# Patient Record
Sex: Female | Born: 1945
Health system: Southern US, Community
[De-identification: ages and names within clinical notes are randomized; demographics above are authoritative.]

## PROBLEM LIST (undated history)

## (undated) DIAGNOSIS — M1990 Unspecified osteoarthritis, unspecified site: Secondary | ICD-10-CM

## (undated) DIAGNOSIS — E039 Hypothyroidism, unspecified: Secondary | ICD-10-CM

## (undated) DIAGNOSIS — E669 Obesity, unspecified: Secondary | ICD-10-CM

## (undated) DIAGNOSIS — E079 Disorder of thyroid, unspecified: Secondary | ICD-10-CM

## (undated) DIAGNOSIS — T7840XA Allergy, unspecified, initial encounter: Secondary | ICD-10-CM

## (undated) DIAGNOSIS — C801 Malignant (primary) neoplasm, unspecified: Secondary | ICD-10-CM

## (undated) DIAGNOSIS — I10 Essential (primary) hypertension: Secondary | ICD-10-CM

## (undated) DIAGNOSIS — K5790 Diverticulosis of intestine, part unspecified, without perforation or abscess without bleeding: Secondary | ICD-10-CM

## (undated) DIAGNOSIS — L570 Actinic keratosis: Secondary | ICD-10-CM

## (undated) DIAGNOSIS — G4730 Sleep apnea, unspecified: Secondary | ICD-10-CM

## (undated) DIAGNOSIS — R7302 Impaired glucose tolerance (oral): Secondary | ICD-10-CM

## (undated) HISTORY — DX: Disorder of thyroid, unspecified: E07.9

## (undated) HISTORY — DX: Sleep apnea, unspecified: G47.30

## (undated) HISTORY — PX: MOHS SURGERY: SUR867

## (undated) HISTORY — DX: Obesity, unspecified: E66.9

## (undated) HISTORY — DX: Actinic keratosis: L57.0

## (undated) HISTORY — DX: Impaired glucose tolerance (oral): R73.02

## (undated) HISTORY — PX: CATARACT EXTRACTION: SUR2

## (undated) HISTORY — DX: Essential (primary) hypertension: I10

## (undated) HISTORY — DX: Diverticulosis of intestine, part unspecified, without perforation or abscess without bleeding: K57.90

## (undated) HISTORY — PX: BROW LIFT: SHX178

## (undated) HISTORY — DX: Allergy, unspecified, initial encounter: T78.40XA

---

## 2005-12-21 ENCOUNTER — Ambulatory Visit: Payer: Self-pay | Admitting: Family Medicine

## 2005-12-28 ENCOUNTER — Ambulatory Visit: Payer: Self-pay | Admitting: Family Medicine

## 2006-04-06 ENCOUNTER — Ambulatory Visit: Payer: Self-pay | Admitting: Family Medicine

## 2006-06-20 ENCOUNTER — Ambulatory Visit: Payer: Self-pay | Admitting: Family Medicine

## 2007-02-20 ENCOUNTER — Ambulatory Visit: Payer: Self-pay | Admitting: Family Medicine

## 2007-02-20 ENCOUNTER — Other Ambulatory Visit: Admission: RE | Admit: 2007-02-20 | Discharge: 2007-02-20 | Payer: Self-pay | Admitting: Family Medicine

## 2008-01-08 ENCOUNTER — Ambulatory Visit: Payer: Self-pay | Admitting: Family Medicine

## 2008-01-09 ENCOUNTER — Encounter: Admission: RE | Admit: 2008-01-09 | Discharge: 2008-01-09 | Payer: Self-pay | Admitting: Family Medicine

## 2008-02-26 ENCOUNTER — Ambulatory Visit: Payer: Self-pay | Admitting: Family Medicine

## 2008-03-11 ENCOUNTER — Ambulatory Visit: Payer: Self-pay | Admitting: Internal Medicine

## 2008-03-25 ENCOUNTER — Ambulatory Visit: Payer: Self-pay | Admitting: Internal Medicine

## 2009-03-20 ENCOUNTER — Ambulatory Visit: Payer: Self-pay | Admitting: Family Medicine

## 2009-03-20 ENCOUNTER — Other Ambulatory Visit: Admission: RE | Admit: 2009-03-20 | Discharge: 2009-03-20 | Payer: Self-pay | Admitting: Family Medicine

## 2009-09-23 ENCOUNTER — Ambulatory Visit: Payer: Self-pay | Admitting: Family Medicine

## 2010-02-28 HISTORY — PX: COLONOSCOPY: SHX174

## 2010-02-28 HISTORY — PX: TAYLOR BUNIONECTOMY: SHX2485

## 2010-02-28 LAB — HM COLONOSCOPY

## 2010-03-30 NOTE — Procedures (Signed)
Summary: Colonoscopy   Colonoscopy  Procedure date:  03/25/2008  Findings:      Location:  [facility].    Procedures Next Due Date:    Colonoscopy: 03/2018  COLONOSCOPY PROCEDURE REPORT  PATIENT:  Carla Wright, Carla Wright  MR#:  [id number] BIRTHDATE:   [birth year]   GENDER:   female  ENDOSCOPIST:   Hedwig Morton. Juanda Chance, MD Referred by: [name], M.D.  PROCEDURE DATE:  03/25/2008 PROCEDURE:  Colonoscopy, diagnostic ASA CLASS:   Class I INDICATIONS: screening   MEDICATIONS:    Versed 7 mg, Fentanyl 50 mcg  DESCRIPTION OF PROCEDURE:   After the risks benefits and alternatives of the procedure were thoroughly explained, informed consent was obtained.  Digital rectal exam was performed and revealed no rectal masses.   The LB PCF-H180AL [id number] endoscope was introduced through the anus and advanced to the cecum, which was identified by both the appendix and ileocecal valve, without limitations.  The quality of the prep was poor, using MiraLax.  The instrument was then slowly withdrawn as the colon was fully examined. <<PROCEDUREIMAGES>>            <<OLD IMAGES>>  FINDINGS:  normal cecum (see image4, image5, and image6). prominent but normal ileocecal valve  normal rectum (see image7).  Severe diverticulosis was found throughout the colon (see image1, image2, and image3).   Retroflexed views in the rectum revealed no abnormalities.    The scope was then withdrawn from the patient and the procedure completed.  COMPLICATIONS:   None  ENDOSCOPIC IMPRESSION:  1) Normal cecum  2) Normal rectum  3) Severe diverticulosis throughout the colon  no polyps RECOMMENDATIONS:  1) high fiber diet  2) metamucil or benefiber  REPEAT EXAM:   In 10 year(s) for.   _______________________________ Hedwig Morton. Juanda Chance, MD  CC: [name], MD

## 2010-03-30 NOTE — Miscellaneous (Signed)
Summary: GI PV  Clinical Lists Changes  Medications: Added new medication of DULCOLAX 5 MG  TBEC (BISACODYL) Day before procedure take 2 at 3pm and 2 at 8pm. - Signed Added new medication of METOCLOPRAMIDE HCL 10 MG  TABS (METOCLOPRAMIDE HCL) As per prep instructions. - Signed Added new medication of MIRALAX   POWD (POLYETHYLENE GLYCOL 3350) As per prep  instructions. - Signed Rx of DULCOLAX 5 MG  TBEC (BISACODYL) Day before procedure take 2 at 3pm and 2 at 8pm.;  #4 x 0;  Signed;  Entered by: Barton Fanny RN;  Authorized by: Hart Carwin MD;  Method used: Print then Give to Patient Rx of METOCLOPRAMIDE HCL 10 MG  TABS (METOCLOPRAMIDE HCL) As per prep instructions.;  #2 x 0;  Signed;  Entered by: Barton Fanny RN;  Authorized by: Hart Carwin MD;  Method used: Print then Give to Patient Rx of MIRALAX   POWD (POLYETHYLENE GLYCOL 3350) As per prep  instructions.;  #255gm x 0;  Signed;  Entered by: Barton Fanny RN;  Authorized by: Hart Carwin MD;  Method used: Print then Give to Patient Observations: Added new observation of NKA: T (03/11/2008 10:30)    Prescriptions: MIRALAX   POWD (POLYETHYLENE GLYCOL 3350) As per prep  instructions.  #255gm x 0   Entered by:   Barton Fanny RN   Authorized by:   Hart Carwin MD   Signed by:   Barton Fanny RN on 03/11/2008   Method used:   Print then Give to Patient   RxID:   [id number] METOCLOPRAMIDE HCL 10 MG  TABS (METOCLOPRAMIDE HCL) As per prep instructions.  #2 x 0   Entered by:   Barton Fanny RN   Authorized by:   Hart Carwin MD   Signed by:   Barton Fanny RN on 03/11/2008   Method used:   Print then Give to Patient   RxID:   [id number] DULCOLAX 5 MG  TBEC (BISACODYL) Day before procedure take 2 at 3pm and 2 at 8pm.  #4 x 0   Entered by:   Barton Fanny RN   Authorized by:   Hart Carwin MD   Signed by:   Barton Fanny RN on 03/11/2008   Method used:   Print then Give to Patient   RxID:   [id number]

## 2010-05-13 ENCOUNTER — Ambulatory Visit
Admission: RE | Admit: 2010-05-13 | Discharge: 2010-05-13 | Disposition: A | Discharge status: Home / Self Care | Payer: 59 | Source: Ambulatory Visit | Attending: Family Medicine | Admitting: Family Medicine

## 2010-05-13 ENCOUNTER — Other Ambulatory Visit: Payer: Self-pay | Admitting: Family Medicine

## 2010-05-13 ENCOUNTER — Encounter (INDEPENDENT_AMBULATORY_CARE_PROVIDER_SITE_OTHER): Payer: 59 | Admitting: Family Medicine

## 2010-05-13 DIAGNOSIS — E039 Hypothyroidism, unspecified: Secondary | ICD-10-CM

## 2010-05-13 DIAGNOSIS — G4730 Sleep apnea, unspecified: Secondary | ICD-10-CM

## 2010-05-13 DIAGNOSIS — Z0000 Encounter for general adult medical examination without abnormal findings: Secondary | ICD-10-CM

## 2010-05-13 DIAGNOSIS — R52 Pain, unspecified: Secondary | ICD-10-CM

## 2010-05-13 DIAGNOSIS — M109 Gout, unspecified: Secondary | ICD-10-CM

## 2010-05-13 DIAGNOSIS — Z79899 Other long term (current) drug therapy: Secondary | ICD-10-CM

## 2010-07-22 ENCOUNTER — Telehealth: Payer: Self-pay | Admitting: Family Medicine

## 2010-07-31 ENCOUNTER — Encounter: Payer: Self-pay | Admitting: Family Medicine

## 2010-07-31 DIAGNOSIS — Z8739 Personal history of other diseases of the musculoskeletal system and connective tissue: Secondary | ICD-10-CM | POA: Insufficient documentation

## 2010-07-31 DIAGNOSIS — R7302 Impaired glucose tolerance (oral): Secondary | ICD-10-CM | POA: Insufficient documentation

## 2010-07-31 DIAGNOSIS — G4730 Sleep apnea, unspecified: Secondary | ICD-10-CM | POA: Insufficient documentation

## 2010-07-31 DIAGNOSIS — K5790 Diverticulosis of intestine, part unspecified, without perforation or abscess without bleeding: Secondary | ICD-10-CM | POA: Insufficient documentation

## 2010-07-31 DIAGNOSIS — L570 Actinic keratosis: Secondary | ICD-10-CM | POA: Insufficient documentation

## 2010-08-02 ENCOUNTER — Ambulatory Visit (INDEPENDENT_AMBULATORY_CARE_PROVIDER_SITE_OTHER): Payer: 59 | Admitting: Family Medicine

## 2010-08-02 ENCOUNTER — Encounter: Payer: Self-pay | Admitting: Family Medicine

## 2010-08-02 VITALS — BP 116/70 | HR 85

## 2010-08-02 DIAGNOSIS — G4733 Obstructive sleep apnea (adult) (pediatric): Secondary | ICD-10-CM | POA: Insufficient documentation

## 2010-08-02 NOTE — Progress Notes (Signed)
  Subjective:    Patient ID: Carla Wright, female    DOB: 1945-11-26, 65 y.o.   MRN: [id number]  HPI She is here for consult. Her apnea length did show borderline sleep apnea with definite hypoxia.   Review of Systems     Objective:   Physical Exam Alert and in no distress otherwise not examined       Assessment & Plan:  Sleep apnea. Obesity. I discussed the diagnosis and the ramifications with treatment versus no treatment. Gustatory use of sleep and help if needed. Also strongly encouraged her to make lifestyle changse to help with weight loss. She will be set up for CPAP autotitrate. Over 20 minutes spent discussing this with her.

## 2010-08-02 NOTE — Patient Instructions (Signed)
Use the machine regularly at night and if you have trouble with the, please call me. Recheck here in one month.

## 2010-08-05 NOTE — Telephone Encounter (Signed)
Pt needs appt

## 2010-09-07 ENCOUNTER — Ambulatory Visit: Payer: 59 | Admitting: Family Medicine

## 2010-09-14 ENCOUNTER — Ambulatory Visit: Payer: 59 | Admitting: Family Medicine

## 2011-06-13 ENCOUNTER — Ambulatory Visit (INDEPENDENT_AMBULATORY_CARE_PROVIDER_SITE_OTHER): Payer: 59 | Admitting: Family Medicine

## 2011-06-13 ENCOUNTER — Encounter: Payer: Self-pay | Admitting: Family Medicine

## 2011-06-13 ENCOUNTER — Other Ambulatory Visit: Payer: Self-pay | Admitting: Family Medicine

## 2011-06-13 VITALS — BP 130/84 | HR 80 | Ht 64.5 in | Wt 250.0 lb

## 2011-06-13 DIAGNOSIS — I10 Essential (primary) hypertension: Secondary | ICD-10-CM

## 2011-06-13 DIAGNOSIS — Z0000 Encounter for general adult medical examination without abnormal findings: Secondary | ICD-10-CM

## 2011-06-13 DIAGNOSIS — E1159 Type 2 diabetes mellitus with other circulatory complications: Secondary | ICD-10-CM | POA: Insufficient documentation

## 2011-06-13 DIAGNOSIS — E039 Hypothyroidism, unspecified: Secondary | ICD-10-CM | POA: Insufficient documentation

## 2011-06-13 DIAGNOSIS — E669 Obesity, unspecified: Secondary | ICD-10-CM

## 2011-06-13 DIAGNOSIS — M109 Gout, unspecified: Secondary | ICD-10-CM

## 2011-06-13 DIAGNOSIS — G4730 Sleep apnea, unspecified: Secondary | ICD-10-CM | POA: Diagnosis not present

## 2011-06-13 DIAGNOSIS — Z79899 Other long term (current) drug therapy: Secondary | ICD-10-CM

## 2011-06-13 NOTE — Progress Notes (Signed)
Subjective:    Patient ID: Carla Wright, female    DOB: [birth year], 66 y.o.   MRN: [id number]  HPI She is here for a complete examination. She is scheduled for hip replacement. Presently she is using a walker because of the pain. She mainly complains of right hip pain and is also having some difficulty with her left hip. No other joints seem to be involved. She's had no swelling redness or tenderness from her gout. She has no other concerns or complaints. She has lost a little bit of weight during this time frame. She continues on medications listed in the chart and has no particular problems or concerns about these medications. Her immunizations were reviewed. She has had a mammogram. Pap was in 2011. Some underlying sleep apnea and we've agreed on that. She will send off for that. She presently is involved in [employer]. She is planning to retire next year.   Review of Systems  Constitutional: Negative.   HENT: Negative.   Respiratory: Negative.   Cardiovascular: Negative.   Musculoskeletal: Positive for arthralgias and gait problem.  Neurological: Negative.   Hematological: Negative.   Psychiatric/Behavioral: Negative.        Objective:   Physical Exam BP 130/84  Pulse 80  Ht 5' 4.5" (1.638 m)  Wt 250 lb (113.399 kg)  BMI 42.25 kg/m2  SpO2 98%  General Appearance:    Alert, cooperative, no distress, appears stated age  Head:    Normocephalic, without obvious abnormality, atraumatic  Eyes:    PERRL, conjunctiva/corneas clear, EOM's intact, fundi    benign  Ears:    Normal TM's and external ear canals  Nose:   Nares normal, mucosa normal, no drainage or sinus   tenderness  Throat:   Lips, mucosa, and tongue normal; teeth and gums normal  Neck:   Supple, no lymphadenopathy;  thyroid:  no   enlargement/tenderness/nodules; no carotid   bruit or JVD  Back:    Spine nontender, no curvature, ROM normal, no CVA     tenderness  Lungs:     Clear to auscultation bilaterally without  wheezes, rales or     ronchi; respirations unlabored  Chest Wall:    No tenderness or deformity   Heart:    Regular rate and rhythm, S1 and S2 normal, no murmur, rub   or gallop  Breast Exam:    Deferred to GYN  Abdomen:     Soft, non-tender, nondistended, normoactive bowel sounds,    no masses, no hepatosplenomegaly  Genitalia:    Deferred to GYN     Extremities:   No clubbing, cyanosis or edema  Pulses:   2+ and symmetric all extremities  Skin:   Skin color, texture, turgor normal, no rashes or lesions  Lymph nodes:   Cervical, supraclavicular, and axillary nodes normal  Neurologic:   CNII-XII intact, normal strength, sensation and gait; reflexes 2+ and symmetric throughout          Psych:   Normal mood, affect, hygiene and grooming.    EKG shows minimal change from one that was done 10 years ago. Incomplete bundle block is noted        Assessment & Plan:   1. Routine general medical examination at a health care facility  DG Bone Density, PR ELECTROCARDIOGRAM, COMPLETE, CBC with Differential, Comprehensive metabolic panel  2. Obesity  CBC with Differential, Comprehensive metabolic panel, Lipid panel  3. Gout  Uric Acid  4. Sleep apnea    5.  Hypothyroid  TSH  6. Hypertension  PR ELECTROCARDIOGRAM, COMPLETE  7. Encounter for long-term (current) use of other medications  CBC with Differential, Comprehensive metabolic panel, Uric Acid, TSH, Lipid panel   we will get a read out on her CPAP. She is cleared for surgery.

## 2011-06-14 LAB — COMPREHENSIVE METABOLIC PANEL
ALT: 9 U/L (ref 0–35)
AST: 12 U/L (ref 0–37)
Albumin: 4.1 g/dL (ref 3.5–5.2)
Alkaline Phosphatase: 82 U/L (ref 39–117)
BUN: 27 mg/dL — ABNORMAL HIGH (ref 6–23)
CO2: 27 mEq/L (ref 19–32)
Calcium: 10.4 mg/dL (ref 8.4–10.5)
Chloride: 100 mEq/L (ref 96–112)
Creat: 0.89 mg/dL (ref 0.50–1.10)
Glucose, Bld: 90 mg/dL (ref 70–99)
Potassium: 4 mEq/L (ref 3.5–5.3)
Sodium: 137 mEq/L (ref 135–145)
Total Bilirubin: 0.5 mg/dL (ref 0.3–1.2)
Total Protein: 6.7 g/dL (ref 6.0–8.3)

## 2011-06-14 LAB — URIC ACID: Uric Acid, Serum: 4.4 mg/dL (ref 2.4–7.0)

## 2011-06-14 LAB — LIPID PANEL
Cholesterol: 155 mg/dL (ref 0–200)
HDL: 58 mg/dL (ref >39)
LDL Cholesterol: 85 mg/dL (ref 0–99)
Total CHOL/HDL Ratio: 2.7 Ratio
Triglycerides: 59 mg/dL (ref <150)
VLDL: 12 mg/dL (ref 0–40)

## 2011-06-14 LAB — CBC WITH DIFFERENTIAL/PLATELET
Basophils Absolute: 0 10*3/uL (ref 0.0–0.1)
Basophils Relative: 0 % (ref 0–1)
Eosinophils Absolute: 0.2 10*3/uL (ref 0.0–0.7)
Eosinophils Relative: 2 % (ref 0–5)
HCT: 39.2 % (ref 36.0–46.0)
Hemoglobin: 12.5 g/dL (ref 12.0–15.0)
Lymphocytes Relative: 21 % (ref 12–46)
Lymphs Abs: 2.3 10*3/uL (ref 0.7–4.0)
MCH: 31.3 pg (ref 26.0–34.0)
MCHC: 31.9 g/dL (ref 30.0–36.0)
MCV: 98 fL (ref 78.0–100.0)
Monocytes Absolute: 0.3 10*3/uL (ref 0.1–1.0)
Monocytes Relative: 2 % — ABNORMAL LOW (ref 3–12)
Neutro Abs: 8.1 10*3/uL — ABNORMAL HIGH (ref 1.7–7.7)
Neutrophils Relative %: 75 % (ref 43–77)
Platelets: 268 10*3/uL (ref 150–400)
RBC: 4 MIL/uL (ref 3.87–5.11)
RDW: 14.5 % (ref 11.5–15.5)
WBC: 10.9 10*3/uL — ABNORMAL HIGH (ref 4.0–10.5)

## 2011-06-14 LAB — TSH: TSH: 1.887 u[IU]/mL (ref 0.350–4.500)

## 2011-06-19 ENCOUNTER — Other Ambulatory Visit: Payer: Self-pay | Admitting: Orthopedic Surgery

## 2011-06-19 MED ORDER — BUPIVACAINE LIPOSOME 1.3 % IJ SUSP: 20.0000 mL | Freq: Once | INTRAMUSCULAR | Status: DC

## 2011-06-19 MED ORDER — DEXAMETHASONE SODIUM PHOSPHATE 10 MG/ML IJ SOLN: 10.0000 mg | Freq: Once | INTRAMUSCULAR | Status: DC

## 2011-06-21 LAB — HM DEXA SCAN: HM Dexa Scan: NORMAL

## 2011-06-25 ENCOUNTER — Other Ambulatory Visit: Payer: Self-pay | Admitting: Family Medicine

## 2011-06-27 ENCOUNTER — Encounter: Payer: Self-pay | Admitting: Internal Medicine

## 2011-08-05 NOTE — H&P (Signed)
Carla Wright DOB: 20-Feb-1946  Chief Complaint: right hip pain  History of Present Illness The patient is a 66 year old female who comes in today for a preoperative History and Physical. The patient is scheduled for a right total hip arthroplasty to be performed by Dr. [name]. Aluisio, MD at [facility] on Monday August 22, 2011 . Owen has had trouble with pain in both hips. She has an intra-articular injection in both hips. The left hip did very well with the intraarticular injection. The right hip only got better with a couple of days. Pain is occurring at all times. It is limiting what she can and cannot do. It has progressed significantly in the past year. Due to failure of conservative measures in the right hip, the most predictable means for decreased pain and increased function in the right hip is a right total hip arthroplasty. Risks and benefits of the surgery discussed.  PCP: Dr. [name]   Problem List/Past Medica History Osteoarthrosis, hip Gout Skin Cancer. basal cell carcinoma on face Sleep Apnea. does not use CPAP Hypothyroidism Osteoarthritis Hypertension Diverticulosis Urinary Tract Infection Hypothyroidism Measles. childhood Rubella. childhood Menopause  Allergies No Known Drug Allergies.   Family History Heart disease in female family member before age 15 Hypertension. father Osteoarthritis. father and sister Congestive Heart Failure. mother Heart Disease. mother Father. deceased age 56 due to heart disease Mother. deceased age 84 due to CHF   Social History Tobacco use. former smoker; smoke(d) 1 pack(s) per day; quit 30 years ago Tobacco / smoke exposure. no Previously in rehab. no Pain Contract. no Number of flights of stairs before winded. 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 single Living situation. live alone Illicit drug use. no Exercise. Exercises  never Drug/Alcohol Rehab (Currently). no Current work status. working full time Children. 0 Alcohol use. current drinker; drinks wine and hard liquor; only occasionally per week Post-Surgical Plans. going to sister's house Advance Directives. none   Medication History Mobic (15MG  Tablet, 1 Oral TABLET DAILY WITH FOOD., Taken starting 05/20/2011) Active. Ultracet (37.5-325MG  Tablet, 1-2 Oral q 6-8hrs prn pain, Taken starting 05/20/2011) Active. Allopurinol (300MG  Tablet, Oral daily) Active. Lisinopril-Hydrochlorothiazide (20-12.5MG  Tablet, Oral daily) Active. Synthroid ( Tablet, Oral daily) Active. Advil (200MG  Capsule, Oral as needed) Active. Tylenol Extra Strength (500MG  Tablet, Oral as needed) Active. Womens 50+ Multi Vitamin/Min ( Oral daily) Active. B Complex ( Oral daily) Active. Aspirin EC (81MG  Tablet DR, Oral daily) Active.   Past Surgical History Foot Surgery. right 2012   Diagnostic Studies History EKG. history of bundle branch block 06/13/2011    Review of Systems General:Not Present- Chills, Fever, Night Sweats, Fatigue, Weight Gain, Weight Loss and Memory Loss. Skin:Not Present- Hives, Itching, Rash, Eczema and Lesions. HEENT:Not Present- Tinnitus, Headache, Double Vision, Visual Loss, Hearing Loss and Dentures. Respiratory:Present- Allergies. Not Present- Shortness of breath with exertion, Shortness of breath at rest, Coughing up blood and Chronic Cough. Cardiovascular:Not Present- Chest Pain, Racing/skipping heartbeats, Difficulty Breathing Lying Down, Murmur, Swelling and Palpitations. Gastrointestinal:Not Present- Bloody Stool, Heartburn, Abdominal Pain, Vomiting, Nausea, Constipation, Diarrhea, Difficulty Swallowing, Jaundice and Loss of appetitie. Female Genitourinary:Not Present- Blood in Urine, Urinary frequency, Weak urinary stream, Discharge, Flank Pain, Incontinence, Painful Urination, Urgency, Urinary Retention and Urinating at  Night. Musculoskeletal:Present- Joint Pain. Not Present- Muscle Weakness, Muscle Pain, Joint Swelling, Back Pain, Morning Stiffness and Spasms. Neurological:Not Present- Tremor, Dizziness, Blackout spells, Paralysis, Difficulty with balance and Weakness. Psychiatric:Not Present- Insomnia.   Vitals  Weight: 249 lb Height: 65 in Body Surface Area: 2.28 m Body Mass Index: 41.44 kg/m Pulse: 76 (Regular) Resp.: 16 (Unlabored) BP: 122/74 (Sitting, Left Arm, Standard)    Physical Exam General Mental Status - Alert, cooperative and good historian. General Appearance- pleasant. Not in acute distress. Orientation- Oriented X3. Build & Nutrition- Obese and Well developed. Head and Neck Head- normocephalic, atraumatic . Neck Global Assessment- supple. no bruit auscultated on the right and no bruit auscultated on the left. Eye Pupil- Bilateral- Normal. Motion- Bilateral- EOMI. Chest and Lung Exam Auscultation: Breath sounds:- clear at anterior chest wall and - clear at posterior chest wall. Adventitious sounds:- No Adventitious sounds. Cardiovascular Auscultation:Rhythm- Regular rate and rhythm. Heart Sounds- S1 WNL and S2 WNL. Murmurs & Other Heart Sounds:Auscultation of the heart reveals - No Murmurs. Abdomen Palpation/Percussion:Tenderness- Abdomen is non-tender to palpation. Rigidity (guarding)- Abdomen is soft. Auscultation:Auscultation of the abdomen reveals - Bowel sounds normal. Female Genitourinary Not done, not pertinent to present illness Peripheral Vascular Upper Extremity: Palpation:- Pulses bilaterally normal. Lower Extremity: Palpation:- Pulses bilaterally normal. Neurologic Examination of related systems reveals - normal muscle strength and tone in all extremities. Neurologic evaluation reveals - normal sensation and upper and lower extremity deep tendon reflexes intact bilaterally . Musculoskeletal Her right hip  only can be flexed to 90 with no rotation, about 20 degrees of abduction. The left hip flexion 100, rotation in 10, out 20, abduction 20 with pain. Tenderness over the right greater trochanteric bursa. No tenderness over the left.    RADIOGRAPHS: AP pelvis, lateral of both hips, and she has horrendous arthritic change in both hips. The right is bone on bone with some erosion of the femoral head and superior acetabulum. The left is bone on bone with no erosions.   Assessment & Plan Osteoarthrosis, hip Right total hip arthroplasty     Dimitri Ped, PA-C

## 2011-08-10 ENCOUNTER — Encounter (HOSPITAL_COMMUNITY): Payer: Self-pay | Admitting: Pharmacy Technician

## 2011-08-15 ENCOUNTER — Ambulatory Visit (HOSPITAL_COMMUNITY)
Admission: RE | Admit: 2011-08-15 | Discharge: 2011-08-15 | Disposition: A | Discharge status: Home / Self Care | Payer: 59 | Source: Ambulatory Visit | Attending: Orthopedic Surgery | Admitting: Orthopedic Surgery

## 2011-08-15 ENCOUNTER — Encounter (HOSPITAL_COMMUNITY): Payer: Self-pay

## 2011-08-15 ENCOUNTER — Encounter (HOSPITAL_COMMUNITY)
Admission: RE | Admit: 2011-08-15 | Discharge: 2011-08-15 | Disposition: A | Discharge status: Home / Self Care | Payer: 59 | Source: Ambulatory Visit | Attending: Orthopedic Surgery | Admitting: Orthopedic Surgery

## 2011-08-15 DIAGNOSIS — M1610 Unilateral primary osteoarthritis, unspecified hip: Secondary | ICD-10-CM | POA: Insufficient documentation

## 2011-08-15 DIAGNOSIS — S73006A Unspecified dislocation of unspecified hip, initial encounter: Secondary | ICD-10-CM | POA: Diagnosis not present

## 2011-08-15 DIAGNOSIS — Z01818 Encounter for other preprocedural examination: Secondary | ICD-10-CM | POA: Insufficient documentation

## 2011-08-15 DIAGNOSIS — M169 Osteoarthritis of hip, unspecified: Secondary | ICD-10-CM | POA: Insufficient documentation

## 2011-08-15 DIAGNOSIS — Z01811 Encounter for preprocedural respiratory examination: Secondary | ICD-10-CM | POA: Diagnosis not present

## 2011-08-15 DIAGNOSIS — M25559 Pain in unspecified hip: Secondary | ICD-10-CM | POA: Diagnosis not present

## 2011-08-15 DIAGNOSIS — Z01812 Encounter for preprocedural laboratory examination: Secondary | ICD-10-CM | POA: Insufficient documentation

## 2011-08-15 HISTORY — DX: Malignant (primary) neoplasm, unspecified: C80.1

## 2011-08-15 HISTORY — DX: Unspecified osteoarthritis, unspecified site: M19.90

## 2011-08-15 HISTORY — DX: Hypothyroidism, unspecified: E03.9

## 2011-08-15 LAB — URINALYSIS, ROUTINE W REFLEX MICROSCOPIC
Bilirubin Urine: NEGATIVE
Glucose, UA: NEGATIVE mg/dL
Hgb urine dipstick: NEGATIVE
Ketones, ur: NEGATIVE mg/dL
Nitrite: NEGATIVE
Protein, ur: NEGATIVE mg/dL
Specific Gravity, Urine: 1.019 (ref 1.005–1.030)
Urobilinogen, UA: 0.2 mg/dL (ref 0.0–1.0)
pH: 6 (ref 5.0–8.0)

## 2011-08-15 LAB — COMPREHENSIVE METABOLIC PANEL
ALT: 10 U/L (ref 0–35)
AST: 14 U/L (ref 0–37)
Albumin: 4.2 g/dL (ref 3.5–5.2)
Alkaline Phosphatase: 89 U/L (ref 39–117)
BUN: 26 mg/dL — ABNORMAL HIGH (ref 6–23)
CO2: 25 mEq/L (ref 19–32)
Calcium: 10.8 mg/dL — ABNORMAL HIGH (ref 8.4–10.5)
Chloride: 98 mEq/L (ref 96–112)
Creatinine, Ser: 0.91 mg/dL (ref 0.50–1.10)
GFR calc Af Amer: 75 mL/min — ABNORMAL LOW (ref >90)
GFR calc non Af Amer: 65 mL/min — ABNORMAL LOW (ref >90)
Glucose, Bld: 84 mg/dL (ref 70–99)
Potassium: 3.6 mEq/L (ref 3.5–5.1)
Sodium: 137 mEq/L (ref 135–145)
Total Bilirubin: 0.3 mg/dL (ref 0.3–1.2)
Total Protein: 7.7 g/dL (ref 6.0–8.3)

## 2011-08-15 LAB — PROTIME-INR
INR: 1.02 (ref 0.00–1.49)
Prothrombin Time: 13.6 seconds (ref 11.6–15.2)

## 2011-08-15 LAB — URINE MICROSCOPIC-ADD ON

## 2011-08-15 LAB — SURGICAL PCR SCREEN
MRSA, PCR: POSITIVE — AB
Staphylococcus aureus: POSITIVE — AB

## 2011-08-15 LAB — APTT: aPTT: 32 seconds (ref 24–37)

## 2011-08-15 LAB — CBC
HCT: 40.1 % (ref 36.0–46.0)
Hemoglobin: 13.4 g/dL (ref 12.0–15.0)
MCH: 31.5 pg (ref 26.0–34.0)
MCHC: 33.4 g/dL (ref 30.0–36.0)
MCV: 94.4 fL (ref 78.0–100.0)
Platelets: 266 10*3/uL (ref 150–400)
RBC: 4.25 MIL/uL (ref 3.87–5.11)
RDW: 13.5 % (ref 11.5–15.5)
WBC: 8.7 10*3/uL (ref 4.0–10.5)

## 2011-08-15 LAB — ABO/RH: ABO/RH(D): O POS

## 2011-08-15 NOTE — Pre-Procedure Instructions (Signed)
EKG reviewed by Dr Rose- "OK" 

## 2011-08-15 NOTE — Patient Instructions (Signed)
20 Carla Wright  08/15/2011   Your procedure is scheduled on:  08/22/11  Surgery Monday  1530-1650  Report to [facility] at    1300     1 PM Call this number if you have problems the morning of surgery: [phone]     Or PST   [id number]  [facility]   Remember:   Do not eat food:After Midnight. Sunday NIGHT  May have clear liquids: until 0700 Monday MORNING THEN NONE  Clear liquids include soda, tea, black coffee, apple or grape juice, broth.  Take these medicines the morning of surgery with A SIP OF WATER:LEVOTHYROXINE               TRAMADOL IF NEEDED   Do not wear jewelry, make-up or nail polish.  Do not wear lotions, powders, or perfumes. You may wear deodorant.  Do not shave 48 hours prior to surgery.  Do not bring valuables to the hospital.  Contacts, dentures or bridgework may not be worn into surgery.  Leave suitcase in the car. After surgery it may be brought to your room.  For patients admitted to the hospital, checkout time is 11:00 AM the day of discharge.   Patients discharged the day of surgery will not be allowed to drive home.  Name and phone number of your driver:    sister                                                                  Special Instructions: CHG Shower Use Special Wash: 1/2 bottle night before surgery and 1/2 bottle morning of surgery. REGULAR SOAP FACE AND PRIVATES              LADIES- NO SHAVING 48 HOURS BEFORE USING BETASEPT SOAP.                  Please read over the following fact sheets that you were given: MRSA Information

## 2011-08-15 NOTE — Pre-Procedure Instructions (Signed)
Spoke with Candy at [facility] who is sending intra office email to provider to review abnormal BUN, Calcium, Urine with Micro

## 2011-08-16 NOTE — Pre-Procedure Instructions (Signed)
Received confirmation 08/15/11 1645 that A Celedonio Savage PA reviewed labs

## 2011-08-22 ENCOUNTER — Encounter (HOSPITAL_COMMUNITY)
Admission: RE | Disposition: A | Discharge status: Home Health | Payer: Self-pay | Source: Ambulatory Visit | Attending: Orthopedic Surgery

## 2011-08-22 ENCOUNTER — Encounter (HOSPITAL_COMMUNITY): Payer: Self-pay | Admitting: *Deleted

## 2011-08-22 ENCOUNTER — Encounter (HOSPITAL_COMMUNITY): Payer: Self-pay | Admitting: Anesthesiology

## 2011-08-22 ENCOUNTER — Ambulatory Visit (HOSPITAL_COMMUNITY): Payer: 59

## 2011-08-22 ENCOUNTER — Ambulatory Visit (HOSPITAL_COMMUNITY): Payer: 59 | Admitting: Anesthesiology

## 2011-08-22 ENCOUNTER — Inpatient Hospital Stay (HOSPITAL_COMMUNITY)
Admission: RE | Admit: 2011-08-22 | Discharge: 2011-08-25 | DRG: 470 | Disposition: A | Discharge status: Home Health | Payer: 59 | Source: Ambulatory Visit | Attending: Orthopedic Surgery | Admitting: Orthopedic Surgery

## 2011-08-22 DIAGNOSIS — E039 Hypothyroidism, unspecified: Secondary | ICD-10-CM | POA: Diagnosis present

## 2011-08-22 DIAGNOSIS — G4730 Sleep apnea, unspecified: Secondary | ICD-10-CM | POA: Diagnosis present

## 2011-08-22 DIAGNOSIS — L570 Actinic keratosis: Secondary | ICD-10-CM | POA: Diagnosis present

## 2011-08-22 DIAGNOSIS — Z471 Aftercare following joint replacement surgery: Secondary | ICD-10-CM | POA: Diagnosis not present

## 2011-08-22 DIAGNOSIS — E669 Obesity, unspecified: Secondary | ICD-10-CM | POA: Diagnosis present

## 2011-08-22 DIAGNOSIS — K5730 Diverticulosis of large intestine without perforation or abscess without bleeding: Secondary | ICD-10-CM | POA: Diagnosis present

## 2011-08-22 DIAGNOSIS — M109 Gout, unspecified: Secondary | ICD-10-CM | POA: Diagnosis present

## 2011-08-22 DIAGNOSIS — M169 Osteoarthritis of hip, unspecified: Secondary | ICD-10-CM | POA: Diagnosis present

## 2011-08-22 DIAGNOSIS — I10 Essential (primary) hypertension: Secondary | ICD-10-CM | POA: Diagnosis present

## 2011-08-22 DIAGNOSIS — Z96649 Presence of unspecified artificial hip joint: Secondary | ICD-10-CM | POA: Diagnosis not present

## 2011-08-22 DIAGNOSIS — M1610 Unilateral primary osteoarthritis, unspecified hip: Principal | ICD-10-CM | POA: Diagnosis present

## 2011-08-22 HISTORY — PX: TOTAL HIP ARTHROPLASTY: SHX124

## 2011-08-22 LAB — TYPE AND SCREEN
ABO/RH(D): O POS
Antibody Screen: NEGATIVE

## 2011-08-22 SURGERY — ARTHROPLASTY, HIP, TOTAL,POSTERIOR APPROACH
Anesthesia: General | Site: Hip | Laterality: Right | Wound class: Clean

## 2011-08-22 MED ORDER — 0.9 % SODIUM CHLORIDE (POUR BTL) OPTIME
TOPICAL | Status: DC | PRN
  Administered 2011-08-22: 1000 mL

## 2011-08-22 MED ORDER — VANCOMYCIN HCL IN DEXTROSE 1-5 GM/200ML-% IV SOLN: 1000.0000 mg | Freq: Once | INTRAVENOUS | Status: DC

## 2011-08-22 MED ORDER — NEOSTIGMINE METHYLSULFATE 1 MG/ML IJ SOLN
INTRAMUSCULAR | Status: DC | PRN
  Administered 2011-08-22: 5 mg via INTRAVENOUS

## 2011-08-22 MED ORDER — PROMETHAZINE HCL 25 MG/ML IJ SOLN: 6.2500 mg | INTRAMUSCULAR | Status: DC | PRN

## 2011-08-22 MED ORDER — SODIUM CHLORIDE 0.9 % IV SOLN: INTRAVENOUS | Status: DC

## 2011-08-22 MED ORDER — BISACODYL 10 MG RE SUPP: 10.0000 mg | Freq: Every day | RECTAL | Status: DC | PRN

## 2011-08-22 MED ORDER — RIVAROXABAN 10 MG PO TABS
10.0000 mg | ORAL_TABLET | Freq: Every day | ORAL | Status: DC
  Administered 2011-08-23 – 2011-08-25 (×3): 10 mg via ORAL
  Filled 2011-08-22 (×4): qty 1

## 2011-08-22 MED ORDER — POLYETHYLENE GLYCOL 3350 17 G PO PACK: 17.0000 g | PACK | Freq: Every day | ORAL | Status: DC | PRN

## 2011-08-22 MED ORDER — VANCOMYCIN HCL IN DEXTROSE 1-5 GM/200ML-% IV SOLN
INTRAVENOUS | Status: AC
  Filled 2011-08-22: qty 200

## 2011-08-22 MED ORDER — ACETAMINOPHEN 10 MG/ML IV SOLN
1000.0000 mg | Freq: Once | INTRAVENOUS | Status: AC
  Administered 2011-08-22: 1000 mg via INTRAVENOUS

## 2011-08-22 MED ORDER — ACETAMINOPHEN 10 MG/ML IV SOLN
1000.0000 mg | Freq: Four times a day (QID) | INTRAVENOUS | Status: AC
  Administered 2011-08-22 – 2011-08-23 (×4): 1000 mg via INTRAVENOUS
  Filled 2011-08-22 (×6): qty 100

## 2011-08-22 MED ORDER — DIPHENHYDRAMINE HCL 12.5 MG/5ML PO ELIX: 12.5000 mg | ORAL_SOLUTION | ORAL | Status: DC | PRN

## 2011-08-22 MED ORDER — ALLOPURINOL 300 MG PO TABS
300.0000 mg | ORAL_TABLET | Freq: Every day | ORAL | Status: DC
  Administered 2011-08-23 – 2011-08-25 (×3): 300 mg via ORAL
  Filled 2011-08-22 (×5): qty 1

## 2011-08-22 MED ORDER — CEFAZOLIN SODIUM-DEXTROSE 2-3 GM-% IV SOLR
2.0000 g | INTRAVENOUS | Status: AC
  Administered 2011-08-22: 2 g via INTRAVENOUS

## 2011-08-22 MED ORDER — BUPIVACAINE LIPOSOME 1.3 % IJ SUSP
20.0000 mL | Freq: Once | INTRAMUSCULAR | Status: AC
  Administered 2011-08-22: 20 mL
  Filled 2011-08-22: qty 20

## 2011-08-22 MED ORDER — VANCOMYCIN HCL 1000 MG IV SOLR
1000.0000 mg | INTRAVENOUS | Status: DC | PRN
  Administered 2011-08-22: 1000 mg via INTRAVENOUS

## 2011-08-22 MED ORDER — METOCLOPRAMIDE HCL 10 MG PO TABS: 5.0000 mg | ORAL_TABLET | Freq: Three times a day (TID) | ORAL | Status: DC | PRN

## 2011-08-22 MED ORDER — OXYCODONE HCL 5 MG PO TABS
5.0000 mg | ORAL_TABLET | ORAL | Status: DC | PRN
  Administered 2011-08-23: 10 mg via ORAL
  Administered 2011-08-23: 5 mg via ORAL
  Administered 2011-08-23 – 2011-08-25 (×11): 10 mg via ORAL
  Filled 2011-08-22 (×13): qty 2

## 2011-08-22 MED ORDER — TRAMADOL HCL 50 MG PO TABS: 50.0000 mg | ORAL_TABLET | Freq: Four times a day (QID) | ORAL | Status: DC | PRN

## 2011-08-22 MED ORDER — FLEET ENEMA 7-19 GM/118ML RE ENEM: 1.0000 | ENEMA | Freq: Once | RECTAL | Status: AC | PRN

## 2011-08-22 MED ORDER — FENTANYL CITRATE 0.05 MG/ML IJ SOLN
INTRAMUSCULAR | Status: DC | PRN
  Administered 2011-08-22: 50 ug via INTRAVENOUS
  Administered 2011-08-22: 100 ug via INTRAVENOUS
  Administered 2011-08-22 (×2): 50 ug via INTRAVENOUS

## 2011-08-22 MED ORDER — METOCLOPRAMIDE HCL 5 MG/ML IJ SOLN: 5.0000 mg | Freq: Three times a day (TID) | INTRAMUSCULAR | Status: DC | PRN

## 2011-08-22 MED ORDER — HYDROMORPHONE HCL PF 1 MG/ML IJ SOLN
INTRAMUSCULAR | Status: DC | PRN
  Administered 2011-08-22 (×3): 0.5 mg via INTRAVENOUS

## 2011-08-22 MED ORDER — CEFAZOLIN SODIUM 1-5 GM-% IV SOLN
1.0000 g | Freq: Four times a day (QID) | INTRAVENOUS | Status: AC
  Administered 2011-08-22 – 2011-08-23 (×2): 1 g via INTRAVENOUS
  Filled 2011-08-22 (×2): qty 50

## 2011-08-22 MED ORDER — SODIUM CHLORIDE 0.9 % IJ SOLN
INTRAMUSCULAR | Status: DC | PRN
  Administered 2011-08-22: 50 mL

## 2011-08-22 MED ORDER — LEVOTHYROXINE SODIUM 88 MCG PO TABS
88.0000 ug | ORAL_TABLET | Freq: Every day | ORAL | Status: DC
  Administered 2011-08-23 – 2011-08-25 (×3): 88 ug via ORAL
  Filled 2011-08-22 (×4): qty 1

## 2011-08-22 MED ORDER — MORPHINE SULFATE 2 MG/ML IJ SOLN
1.0000 mg | INTRAMUSCULAR | Status: DC | PRN
  Administered 2011-08-22 – 2011-08-23 (×3): 2 mg via INTRAVENOUS
  Filled 2011-08-22 (×3): qty 1

## 2011-08-22 MED ORDER — MIDAZOLAM HCL 5 MG/5ML IJ SOLN
INTRAMUSCULAR | Status: DC | PRN
  Administered 2011-08-22: 2 mg via INTRAVENOUS

## 2011-08-22 MED ORDER — METHOCARBAMOL 100 MG/ML IJ SOLN
500.0000 mg | Freq: Four times a day (QID) | INTRAVENOUS | Status: DC | PRN
  Administered 2011-08-22 – 2011-08-23 (×2): 500 mg via INTRAVENOUS
  Filled 2011-08-22 (×3): qty 5

## 2011-08-22 MED ORDER — MENTHOL 3 MG MT LOZG: 1.0000 | LOZENGE | OROMUCOSAL | Status: DC | PRN

## 2011-08-22 MED ORDER — ONDANSETRON HCL 4 MG PO TABS: 4.0000 mg | ORAL_TABLET | Freq: Four times a day (QID) | ORAL | Status: DC | PRN

## 2011-08-22 MED ORDER — ACETAMINOPHEN 10 MG/ML IV SOLN
INTRAVENOUS | Status: AC
  Filled 2011-08-22: qty 100

## 2011-08-22 MED ORDER — CEFAZOLIN SODIUM-DEXTROSE 2-3 GM-% IV SOLR
INTRAVENOUS | Status: AC
  Filled 2011-08-22: qty 50

## 2011-08-22 MED ORDER — ACETAMINOPHEN 650 MG RE SUPP: 650.0000 mg | Freq: Four times a day (QID) | RECTAL | Status: DC | PRN

## 2011-08-22 MED ORDER — ONDANSETRON HCL 4 MG/2ML IJ SOLN: 4.0000 mg | Freq: Four times a day (QID) | INTRAMUSCULAR | Status: DC | PRN

## 2011-08-22 MED ORDER — METHOCARBAMOL 500 MG PO TABS
500.0000 mg | ORAL_TABLET | Freq: Four times a day (QID) | ORAL | Status: DC | PRN
  Administered 2011-08-23 – 2011-08-25 (×7): 500 mg via ORAL
  Filled 2011-08-22 (×7): qty 1

## 2011-08-22 MED ORDER — HYDROMORPHONE HCL PF 1 MG/ML IJ SOLN
INTRAMUSCULAR | Status: AC
  Filled 2011-08-22: qty 1

## 2011-08-22 MED ORDER — ROCURONIUM BROMIDE 100 MG/10ML IV SOLN
INTRAVENOUS | Status: DC | PRN
  Administered 2011-08-22: 30 mg via INTRAVENOUS

## 2011-08-22 MED ORDER — PROPOFOL 10 MG/ML IV EMUL
INTRAVENOUS | Status: DC | PRN
  Administered 2011-08-22: 200 mg via INTRAVENOUS

## 2011-08-22 MED ORDER — PHENOL 1.4 % MT LIQD: 1.0000 | OROMUCOSAL | Status: DC | PRN

## 2011-08-22 MED ORDER — LIDOCAINE HCL (CARDIAC) 20 MG/ML IV SOLN
INTRAVENOUS | Status: DC | PRN
  Administered 2011-08-22: 80 mg via INTRAVENOUS

## 2011-08-22 MED ORDER — DOCUSATE SODIUM 100 MG PO CAPS
100.0000 mg | ORAL_CAPSULE | Freq: Two times a day (BID) | ORAL | Status: DC
  Administered 2011-08-22 – 2011-08-25 (×6): 100 mg via ORAL

## 2011-08-22 MED ORDER — HYDROMORPHONE HCL PF 1 MG/ML IJ SOLN
0.2500 mg | INTRAMUSCULAR | Status: DC | PRN
  Administered 2011-08-22: 0.25 mg via INTRAVENOUS
  Administered 2011-08-22: 0.5 mg via INTRAVENOUS
  Administered 2011-08-22 (×3): 0.25 mg via INTRAVENOUS

## 2011-08-22 MED ORDER — ONDANSETRON HCL 4 MG/2ML IJ SOLN
INTRAMUSCULAR | Status: DC | PRN
  Administered 2011-08-22: 4 mg via INTRAVENOUS

## 2011-08-22 MED ORDER — SUCCINYLCHOLINE CHLORIDE 20 MG/ML IJ SOLN
INTRAMUSCULAR | Status: DC | PRN
  Administered 2011-08-22: 100 mg via INTRAVENOUS

## 2011-08-22 MED ORDER — ACETAMINOPHEN 325 MG PO TABS: 650.0000 mg | ORAL_TABLET | Freq: Four times a day (QID) | ORAL | Status: DC | PRN

## 2011-08-22 MED ORDER — GLYCOPYRROLATE 0.2 MG/ML IJ SOLN
INTRAMUSCULAR | Status: DC | PRN
  Administered 2011-08-22: .8 mg via INTRAVENOUS

## 2011-08-22 MED ORDER — TEMAZEPAM 15 MG PO CAPS: 15.0000 mg | ORAL_CAPSULE | Freq: Every evening | ORAL | Status: DC | PRN

## 2011-08-22 MED ORDER — LACTATED RINGERS IV SOLN
INTRAVENOUS | Status: DC
  Administered 2011-08-22 (×2): 1000 mL via INTRAVENOUS

## 2011-08-22 MED ORDER — KCL IN DEXTROSE-NACL 20-5-0.9 MEQ/L-%-% IV SOLN
INTRAVENOUS | Status: DC
  Administered 2011-08-22 – 2011-08-23 (×3): via INTRAVENOUS
  Filled 2011-08-22 (×3): qty 1000

## 2011-08-22 SURGICAL SUPPLY — 51 items
BAG ZIPLOCK 12X15 (MISCELLANEOUS) ×2 IMPLANT
BIT DRILL 2.8X128 (BIT) ×2 IMPLANT
BLADE EXTENDED COATED 6.5IN (ELECTRODE) ×2 IMPLANT
BLADE SAW SAG 73X25 THK (BLADE) ×1
BLADE SAW SGTL 73X25 THK (BLADE) ×1 IMPLANT
CLOTH BEACON ORANGE TIMEOUT ST (SAFETY) ×2 IMPLANT
DECANTER SPIKE VIAL GLASS SM (MISCELLANEOUS) ×2 IMPLANT
DRAPE INCISE IOBAN 66X45 STRL (DRAPES) ×2 IMPLANT
DRAPE ORTHO SPLIT 77X108 STRL (DRAPES) ×2
DRAPE POUCH INSTRU U-SHP 10X18 (DRAPES) ×2 IMPLANT
DRAPE SURG ORHT 6 SPLT 77X108 (DRAPES) ×2 IMPLANT
DRAPE U-SHAPE 47X51 STRL (DRAPES) ×2 IMPLANT
DRSG ADAPTIC 3X8 NADH LF (GAUZE/BANDAGES/DRESSINGS) ×2 IMPLANT
DRSG MEPILEX BORDER 4X4 (GAUZE/BANDAGES/DRESSINGS) ×2 IMPLANT
DRSG MEPILEX BORDER 4X8 (GAUZE/BANDAGES/DRESSINGS) ×2 IMPLANT
DURAPREP 26ML APPLICATOR (WOUND CARE) ×2 IMPLANT
ELECT REM PT RETURN 9FT ADLT (ELECTROSURGICAL) ×2
ELECTRODE REM PT RTRN 9FT ADLT (ELECTROSURGICAL) ×1 IMPLANT
EVACUATOR 1/8 PVC DRAIN (DRAIN) ×2 IMPLANT
FACESHIELD LNG OPTICON STERILE (SAFETY) ×8 IMPLANT
GLOVE BIO SURGEON STRL SZ7.5 (GLOVE) ×2 IMPLANT
GLOVE BIO SURGEON STRL SZ8 (GLOVE) ×2 IMPLANT
GLOVE BIOGEL PI IND STRL 8 (GLOVE) ×2 IMPLANT
GLOVE BIOGEL PI INDICATOR 8 (GLOVE) ×2
GOWN STRL NON-REIN LRG LVL3 (GOWN DISPOSABLE) ×2 IMPLANT
GOWN STRL REIN XL XLG (GOWN DISPOSABLE) ×2 IMPLANT
IMMOBILIZER KNEE 20 (SOFTGOODS) ×2
IMMOBILIZER KNEE 20 THIGH 36 (SOFTGOODS) ×1 IMPLANT
KIT BASIN OR (CUSTOM PROCEDURE TRAY) ×2 IMPLANT
MANIFOLD NEPTUNE II (INSTRUMENTS) ×2 IMPLANT
NDL SAFETY ECLIPSE 18X1.5 (NEEDLE) ×1 IMPLANT
NEEDLE HYPO 18GX1.5 SHARP (NEEDLE) ×1
NS IRRIG 1000ML POUR BTL (IV SOLUTION) ×2 IMPLANT
PACK TOTAL JOINT (CUSTOM PROCEDURE TRAY) ×2 IMPLANT
PASSER SUT SWANSON 36MM LOOP (INSTRUMENTS) ×2 IMPLANT
POSITIONER SURGICAL ARM (MISCELLANEOUS) ×2 IMPLANT
SPONGE GAUZE 4X4 12PLY (GAUZE/BANDAGES/DRESSINGS) ×2 IMPLANT
SPONGE LAP 18X18 X RAY DECT (DISPOSABLE) ×2 IMPLANT
STRIP CLOSURE SKIN 1/2X4 (GAUZE/BANDAGES/DRESSINGS) ×2 IMPLANT
SUT ETHIBOND NAB CT1 #1 30IN (SUTURE) ×4 IMPLANT
SUT MNCRL AB 4-0 PS2 18 (SUTURE) ×2 IMPLANT
SUT VIC AB 1 CT1 27 (SUTURE) ×2
SUT VIC AB 1 CT1 27XBRD ANTBC (SUTURE) ×2 IMPLANT
SUT VIC AB 2-0 CT1 27 (SUTURE) ×3
SUT VIC AB 2-0 CT1 TAPERPNT 27 (SUTURE) ×3 IMPLANT
SUT VLOC 180 0 24IN GS25 (SUTURE) ×4 IMPLANT
SYR 50ML LL SCALE MARK (SYRINGE) ×2 IMPLANT
TOWEL OR 17X26 10 PK STRL BLUE (TOWEL DISPOSABLE) ×4 IMPLANT
TOWEL OR NON WOVEN STRL DISP B (DISPOSABLE) ×2 IMPLANT
TRAY FOLEY CATH 14FRSI W/METER (CATHETERS) ×2 IMPLANT
WATER STERILE IRR 1500ML POUR (IV SOLUTION) ×2 IMPLANT

## 2011-08-22 NOTE — Interval H&P Note (Signed)
History and Physical Interval Note:  08/22/2011 3:44 PM  Carla Wright  has presented today for surgery, with the diagnosis of Osteoarthritis of the Right Hip  The various methods of treatment have been discussed with the patient and family. After consideration of risks, benefits and other options for treatment, the patient has consented to  Procedure(s) (LRB): TOTAL HIP ARTHROPLASTY (Right) as a surgical intervention .  The patient's history has been reviewed, patient examined, no change in status, stable for surgery.  I have reviewed the patients' chart and labs.  Questions were answered to the patient's satisfaction.     Loanne Drilling

## 2011-08-22 NOTE — Preoperative (Signed)
Beta Blockers   Reason not to administer Beta Blockers:Not Applicable 

## 2011-08-22 NOTE — Transfer of Care (Signed)
Immediate Anesthesia Transfer of Care Note  Patient: Carla Wright  Procedure(s) Performed: Procedure(s) (LRB): TOTAL HIP ARTHROPLASTY (Right)  Patient Location: PACU  Anesthesia Type: General  Level of Consciousness: awake, alert , oriented and patient cooperative  Airway & Oxygen Therapy: Patient Spontanous Breathing and Patient connected to face mask oxygen  Post-op Assessment: Report given to PACU RN, Post -op Vital signs reviewed and stable and Patient moving all extremities  Post vital signs: Reviewed and stable  Complications: No apparent anesthesia complications

## 2011-08-22 NOTE — Op Note (Signed)
Pre-operative diagnosis- Osteoarthritis Right hip  Post-operative diagnosis- Osteoarthritis  Right hip  Procedure-  RightTotal Hip Arthroplasty  Surgeon- [name]. Jeffory Snelgrove, MD  Assistant- Avel Peace, PA-C   Anesthesia  General  EBL- 450   Drain Hemovac   Complication- None  Condition-PACU - hemodynamically stable.   Brief Clinical Note-  Carla Wright is a 66 y.o. female with end stage arthritis of her right hip with progressively worsening pain and dysfunction. Pain occurs with activity and rest including pain at night. She has tried analgesics, protected weight bearing and rest without benefit. Pain is too severe to attempt physical therapy. Radiographs demonstrate bone on bone arthritis with collapse of the femoral head.. She presents now for right THA.  Procedure in detail-   The patient is brought into the operating room and placed on the operating table. After successful administration of General  anesthesia, the patient is placed in the  Left lateral decubitus position with the  Right side up and held in place with the hip positioner. The lower extremity is isolated from the perineum with plastic drapes and time-out is performed by the surgical team. The lower extremity is then prepped and draped in the usual sterile fashion. A short posterolateral incision is made with a ten blade through the subcutaneous tissue to the level of the fascia lata which is incised in line with the skin incision. The sciatic nerve is palpated and protected and the short external rotators and capsule are isolated from the femur. The hip is then dislocated and the center of the femoral head is marked. A trial prosthesis is placed such that the trial head corresponds to the center of the patients' native femoral head. The resection level is marked on the femoral neck and the resection is made with an oscillating saw. The femoral head is removed and femoral retractors placed to gain access to the femoral  canal.      The canal finder is passed into the femoral canal and the canal is thoroughly irrigated with sterile saline to remove the fatty contents. Axial reaming is performed to 13.5  mm, proximal reaming to 67F  and the sleeve machined to a large. A 18 F  large trial sleeve is placed into the proximal femur.      The femur is then retracted anteriorly to gain acetabular exposure. Acetabular retractors are placed and the labrum and osteophytes are removed, Acetabular reaming is performed to 49  mm and a 50  mm Pinnacle acetabular shell is placed in anatomic position with excellent purchase. Additional dome screws  were not needed. The permanent 32 mm neutral + 4 Marathon liner is placed into the acetabular shell.      The trial femur is then placed into the femoral canal. The size is 18 x 13  stem with a 36 + 8  neck and a 32 + 6 head with the neck version matching  the patients' native anteversion. The hip is reduced with excellent stability with full extension and full external rotation, 70 degrees flexion with 40 degrees adduction and 90 degrees internal rotation and 90 degrees of flexion with 70 degrees of internal rotation. The operative leg is placed on top of the non-operative leg and the leg lengths are found to be equal. The trials are then removed and the permanent implant of the same size is impacted into the femoral canal. The ceramic femoral head of the same size as the trial is placed and the hip is reduced  with the same stability parameters. The operative leg is again placed on top of the non-operative leg and the leg lengths are found to be equal.      The wound is then copiously irrigated with saline solution and the capsule and short external rotators are re-attached to the femur through drill holes with Ethibond suture. The fascia lata is closed over a hemovac drain with #1 vicryl suture and the fascia lata, gluteal muscles and subcutaneous tissues are injected with Exparel 20ml diluted with  saline 50ml. The subcutaneous tissues are closed with #1 and2-0 vicryl and the subcuticular layer closed with running 4-0 Monocryl. The drain is hooked to suction, incision cleaned and dried, and steri-srips and a bulky sterile dressing applied. The limb is placed into a knee immobilizer and the patient is awakened and transported to recovery in stable condition.      Please note that a surgical assistant was a medical necessity for this procedure in order to perform it in a safe and expeditious manner. The assistant was necessary to provide retraction to the vital neurovascular structures and to retract and position the limb to allow for anatomic placement of the prosthetic components.  [name] Omid Deardorff, MD    08/22/2011, 5:23 PM

## 2011-08-22 NOTE — Anesthesia Postprocedure Evaluation (Signed)
  Anesthesia Post-op Note  Patient: Carla Wright  Procedure(s) Performed: Procedure(s) (LRB): TOTAL HIP ARTHROPLASTY (Right)  Patient Location: PACU  Anesthesia Type: General  Level of Consciousness: awake and alert   Airway and Oxygen Therapy: Patient Spontanous Breathing  Post-op Pain: mild  Post-op Assessment: Post-op Vital signs reviewed, Patient's Cardiovascular Status Stable, Respiratory Function Stable, Patent Airway and No signs of Nausea or vomiting  Post-op Vital Signs: stable  Complications: No apparent anesthesia complications

## 2011-08-22 NOTE — Anesthesia Preprocedure Evaluation (Addendum)
Anesthesia Evaluation  Patient identified by MRN, date of birth, ID band Patient awake    Reviewed: Allergy & Precautions, H&P , NPO status , Patient's Chart, lab work & pertinent test results  Airway Mallampati: II TM Distance: >3 FB Neck ROM: Full    Dental No notable dental hx.    Pulmonary sleep apnea ,  breath sounds clear to auscultation  Pulmonary exam normal       Cardiovascular hypertension, Pt. on medications Rhythm:Regular Rate:Normal     Neuro/Psych negative neurological ROS  negative psych ROS   GI/Hepatic negative GI ROS, Neg liver ROS,   Endo/Other  Hypothyroidism Morbid obesity  Renal/GU negative Renal ROS  negative genitourinary   Musculoskeletal negative musculoskeletal ROS (+)   Abdominal (+) + obese,   Peds negative pediatric ROS (+)  Hematology negative hematology ROS (+)   Anesthesia Other Findings   Reproductive/Obstetrics negative OB ROS                           Anesthesia Physical Anesthesia Plan  ASA: III  Anesthesia Plan: General   Post-op Pain Management:    Induction: Intravenous  Airway Management Planned: Oral ETT  Additional Equipment:   Intra-op Plan:   Post-operative Plan: Extubation in OR  Informed Consent: I have reviewed the patients History and Physical, chart, labs and discussed the procedure including the risks, benefits and alternatives for the proposed anesthesia with the patient or authorized representative who has indicated his/her understanding and acceptance.   Dental advisory given  Plan Discussed with: CRNA  Anesthesia Plan Comments: (Discussed r/b general versus spinal. Patient prefers general.)      Anesthesia Quick Evaluation

## 2011-08-23 ENCOUNTER — Encounter (HOSPITAL_COMMUNITY): Payer: Self-pay | Admitting: Orthopedic Surgery

## 2011-08-23 LAB — CBC
HCT: 31.2 % — ABNORMAL LOW (ref 36.0–46.0)
Hemoglobin: 10.3 g/dL — ABNORMAL LOW (ref 12.0–15.0)
MCH: 31.1 pg (ref 26.0–34.0)
MCHC: 33 g/dL (ref 30.0–36.0)
MCV: 94.3 fL (ref 78.0–100.0)
Platelets: 188 10*3/uL (ref 150–400)
RBC: 3.31 MIL/uL — ABNORMAL LOW (ref 3.87–5.11)
RDW: 13.3 % (ref 11.5–15.5)
WBC: 8.1 10*3/uL (ref 4.0–10.5)

## 2011-08-23 LAB — BASIC METABOLIC PANEL
BUN: 14 mg/dL (ref 6–23)
CO2: 27 mEq/L (ref 19–32)
Calcium: 9.3 mg/dL (ref 8.4–10.5)
Chloride: 102 mEq/L (ref 96–112)
Creatinine, Ser: 0.76 mg/dL (ref 0.50–1.10)
GFR calc Af Amer: >90 mL/min (ref >90)
GFR calc non Af Amer: 87 mL/min — ABNORMAL LOW (ref >90)
Glucose, Bld: 146 mg/dL — ABNORMAL HIGH (ref 70–99)
Potassium: 4 mEq/L (ref 3.5–5.1)
Sodium: 137 mEq/L (ref 135–145)

## 2011-08-23 NOTE — Progress Notes (Signed)
Physical Therapy Treatment Patient Details Name: Carla Wright MRN: [id number] DOB: Jul 06, 1945 Today's Date: 08/23/2011 Time: 6578-4696 PT Time Calculation (min): 33 min  PT Assessment / Plan / Recommendation Comments on Treatment Session  Pt progressing well with ambulation and exercises.  Continue to provide cuing for maintaining hip precautions     Follow Up Recommendations  Home health PT    Barriers to Discharge None      Equipment Recommendations  Rolling walker with 5" wheels    Recommendations for Other Services OT consult  Frequency 7X/week   Plan Discharge plan remains appropriate    Precautions / Restrictions Precautions Precautions: Posterior Hip Precaution Booklet Issued: Yes (comment) Precaution Comments: Continue to educate Restrictions Weight Bearing Restrictions: No Other Position/Activity Restrictions: WBAT   Pertinent Vitals/Pain 5/10    Mobility  Bed Mobility Bed Mobility: Sit to Supine Sit to Supine: 3: Mod assist;HOB flat Details for Bed Mobility Assistance: Assist for B LE into bed with cues for UE placement/LE placement to adjust hips.  Transfers Transfers: Sit to Stand;Stand to Sit Sit to Stand: 4: Min guard;With upper extremity assist;With armrests;From chair/3-in-1 Stand to Sit: 4: Min guard;With upper extremity assist;To elevated surface;To bed Details for Transfer Assistance: Min/guard for safety with cues for hand placement and LE mangement.   Ambulation/Gait Ambulation/Gait Assistance: 4: Min guard Ambulation Distance (Feet): 60 Feet Assistive device: Rolling walker Ambulation/Gait Assistance Details: Cues for sequencing/technique with RW and for turning towards stronger side.  Gait Pattern: Step-to pattern;Decreased stride length;Trunk flexed Gait velocity: decreased Stairs: No Wheelchair Mobility Wheelchair Mobility: No    Exercises Total Joint Exercises Ankle Circles/Pumps: AROM;Both;20 reps Quad Sets: Strengthening;AROM;Right;10  reps Short Arc Quad: AROM;Strengthening;Right;10 reps Heel Slides: AAROM;Right;10 reps Hip ABduction/ADduction: AAROM;Right;10 reps   PT Diagnosis: Difficulty walking;Abnormality of gait;Generalized weakness;Acute pain  PT Problem List: Decreased strength;Decreased range of motion;Decreased activity tolerance;Decreased balance;Decreased mobility;Obesity;Pain;Decreased knowledge of precautions;Decreased knowledge of use of DME PT Treatment Interventions: DME instruction;Gait training;Functional mobility training;Therapeutic activities;Therapeutic exercise;Balance training;Patient/family education   PT Goals Acute Rehab PT Goals PT Goal Formulation: With patient Time For Goal Achievement: 08/27/11 Potential to Achieve Goals: Good Pt will go Supine/Side to Sit: with supervision PT Goal: Supine/Side to Sit - Progress: Goal set today Pt will go Sit to Supine/Side: with supervision PT Goal: Sit to Supine/Side - Progress: Progressing toward goal Pt will go Sit to Stand: with supervision PT Goal: Sit to Stand - Progress: Progressing toward goal Pt will go Stand to Sit: with supervision PT Goal: Stand to Sit - Progress: Progressing toward goal Pt will Ambulate: 51 - 150 feet;with supervision;with least restrictive assistive device PT Goal: Ambulate - Progress: Progressing toward goal Pt will Perform Home Exercise Program: with supervision, verbal cues required/provided PT Goal: Perform Home Exercise Program - Progress: Progressing toward goal  Visit Information  Last PT Received On: 08/23/11 Assistance Needed: +1    Subjective Data  Subjective: I'm doing ok Patient Stated Goal: to get home   Cognition  Overall Cognitive Status: Appears within functional limits for tasks assessed/performed Arousal/Alertness: Awake/alert Orientation Level: Appears intact for tasks assessed Behavior During Session: [facility] for tasks performed    Balance     End of Session PT - End of Session Activity  Tolerance: Patient tolerated treatment well Patient left: in bed;with call bell/phone within reach;with family/visitor present Nurse Communication: Mobility status    Page, Meribeth Mattes 08/23/2011, 3:50 PM

## 2011-08-23 NOTE — Progress Notes (Signed)
Subjective: 1 Day Post-Op Procedure(s) (LRB): TOTAL HIP ARTHROPLASTY (Right) Patient reports pain as mild.   Patient seen in rounds with Dr. Lequita Halt. Patient is well, and has had no acute complaints or problems We will start therapy today.  Plan is to go Home after hospital stay.  Objective: Vital signs in last 24 hours: Temp:  [97 F (36.1 C)-98.4 F (36.9 C)] 98.4 F (36.9 C) (06/25 0540) Pulse Rate:  [70-90] 88  (06/25 0540) Resp:  [14-22] 16  (06/25 0540) BP: (91-154)/(42-90) 110/74 mmHg (06/25 0540) SpO2:  [98 %-100 %] 100 % (06/25 0540) Weight:  [109.317 kg (241 lb)] 109.317 kg (241 lb) (06/24 2231)  Intake/Output from previous day:  Intake/Output Summary (Last 24 hours) at 08/23/11 0810 Last data filed at 08/23/11 0556  Gross per 24 hour  Intake   4670 ml  Output   1945 ml  Net   2725 ml    Intake/Output this shift: UOP 700  Labs:  [facility] 08/23/11 0419  HGB 10.3*    Basename 08/23/11 0419  WBC 8.1  RBC 3.31*  HCT 31.2*  PLT 188    Basename 08/23/11 0419  NA 137  K 4.0  CL 102  CO2 27  BUN 14  CREATININE 0.76  GLUCOSE 146*  CALCIUM 9.3   No results found for this basename: LABPT:2,INR:2 in the last 72 hours  EXAM General - Patient is Alert, Appropriate and Oriented Extremity - Neurovascular intact Sensation intact distally Dorsiflexion/Plantar flexion intact Dressing - dressing C/D/I Motor Function - intact, moving foot and toes well on exam.  Hemovac pulled without difficulty.  Past Medical History  Diagnosis Date  . Obesity   . Allergy   . Diverticulosis   . Thyroid disease   . Actinic keratoses   . Glucose intolerance (impaired glucose tolerance)   . Gout   . Hypertension     EKG and clearance Dr Christena Flake on chart  . Hypothyroidism   . Sleep apnea     doesnt used machine in months-per Dr Susann Givens note- borderline  . Arthritis   . Cancer     basal cell removed from face    Assessment/Plan: 1 Day Post-Op Procedure(s)  (LRB): TOTAL HIP ARTHROPLASTY (Right) Principal Problem:  *OA (osteoarthritis) of hip   Advance diet Up with therapy Continue foley due to strict I&O and urinary output monitoring Discharge home with home health  DVT Prophylaxis - Xarelto Weight Bearing As tolerated right Leg D/C Knee Immobilizer Hemovac Pulled Begin Therapy Hip Preacutions Keep foley until tomorrow. No vaccines.  Apollos Tenbrink 08/23/2011, 8:10 AM

## 2011-08-23 NOTE — Care Management Note (Unsigned)
    Page 1 of 1   08/23/2011     2:15:08 PM   CARE MANAGEMENT NOTE 08/23/2011  Patient:  TUESDAY, TERLECKI   Account Number:  [id number]  Date Initiated:  08/23/2011  Documentation initiated by:  Colleen Can  Subjective/Objective Assessment:   dx osteoarthritis right hip; total hip replacement     Action/Plan:   CM spoke with patient and sister(who will be caqregiver). Plans are that pt will go to sister's home [place] [facility]. Already has raised toilet seat but will need RW.   Anticipated DC Date:  08/24/2011   Anticipated DC Plan:  HOME W HOME HEALTH SERVICES  In-house referral  NA      DC Planning Services  CM consult  DC out of service area      [facility] Choice  HOME HEALTH  DURABLE MEDICAL EQUIPMENT   Choice offered to / List presented to:  C-1 Patient   DME arranged  Levan Hurst      DME agency  [facility]        Status of service:  In process, will continue to follow Medicare Important Message given?   (If response is "NO", the following Medicare IM given date fields will be blank) Date Medicare IM given:   Date Additional Medicare IM given:    Discharge Disposition:    Per UR Regulation:    If discussed at Long Length of Stay Meetings, dates discussed:    Comments:

## 2011-08-23 NOTE — Evaluation (Signed)
Physical Therapy Evaluation Patient Details Name: Carla Wright MRN: [id number] DOB: Apr 07, 1945 Today's Date: 08/23/2011 Time: 4098-1191 PT Time Calculation (min): 25 min  PT Assessment / Plan / Recommendation Clinical Impression  Pt presents s/p R THA (post) POD 1 with decreased strength, ROM and mobility.  Tolerated ambulation in hallway and bed mobility well with no c/o nausea/dizziness.  Pt will benefit from skilled PT in acute venue to address deficits.  PT recommends HHPT for follow up therapy at D/C to assist in returning pt to PLOF.      PT Assessment  Patient needs continued PT services    Follow Up Recommendations  Home health PT    Barriers to Discharge None      lEquipment Recommendations  Rolling walker with 5" wheels    Recommendations for Other Services OT consult   Frequency 7X/week    Precautions / Restrictions Precautions Precautions: Posterior Hip Precaution Booklet Issued: Yes (comment) Restrictions Weight Bearing Restrictions: No Other Position/Activity Restrictions: WBAT   Pertinent Vitals/Pain 7/10 following bed mobility.       Mobility  Bed Mobility Bed Mobility: Supine to Sit;Sitting - Scoot to Edge of Bed Supine to Sit: 4: Min assist;HOB elevated;With rails Sitting - Scoot to Edge of Bed: 4: Min guard Details for Bed Mobility Assistance: Min assist for RLE off of bed.  Pt demos good use of UE on rails to get into sitting position.  Will progress towards no rails to better simulate home environment.  Transfers Transfers: Sit to Stand;Stand to Sit Sit to Stand: 4: Min assist;With upper extremity assist;From bed;From elevated surface Stand to Sit: 4: Min assist;With upper extremity assist;With armrests;To chair/3-in-1 Details for Transfer Assistance: Cues for hand placement and LE management when sitting/standing and to follow hip precautions.   Ambulation/Gait Ambulation/Gait Assistance: 4: Min assist Ambulation Distance (Feet): 45 Feet Assistive  device: Rolling walker Ambulation/Gait Assistance Details: cues for sequencing/technique with RW and to maintain foot pointed ahead due to tendency to IR foot when ambulating.  Gait Pattern: Step-to pattern;Decreased stride length;Trunk flexed Gait velocity: decreased Stairs: No Wheelchair Mobility Wheelchair Mobility: No    Exercises     PT Diagnosis: Difficulty walking;Abnormality of gait;Generalized weakness;Acute pain  PT Problem List: Decreased strength;Decreased range of motion;Decreased activity tolerance;Decreased balance;Decreased mobility;Obesity;Pain;Decreased knowledge of precautions;Decreased knowledge of use of DME PT Treatment Interventions: DME instruction;Gait training;Functional mobility training;Therapeutic activities;Therapeutic exercise;Balance training;Patient/family education   PT Goals Acute Rehab PT Goals PT Goal Formulation: With patient Time For Goal Achievement: 08/27/11 Potential to Achieve Goals: Good Pt will go Supine/Side to Sit: with supervision PT Goal: Supine/Side to Sit - Progress: Goal set today Pt will go Sit to Supine/Side: with supervision PT Goal: Sit to Supine/Side - Progress: Goal set today Pt will go Sit to Stand: with supervision PT Goal: Sit to Stand - Progress: Goal set today Pt will go Stand to Sit: with supervision PT Goal: Stand to Sit - Progress: Goal set today Pt will Ambulate: 51 - 150 feet;with supervision;with least restrictive assistive device PT Goal: Ambulate - Progress: Goal set today Pt will Perform Home Exercise Program: with supervision, verbal cues required/provided PT Goal: Perform Home Exercise Program - Progress: Goal set today  Visit Information  Assistance Needed: +1    Subjective Data  Subjective: My pain is more muscular Patient Stated Goal: to get home   Prior Functioning  Home Living Lives With: Family Available Help at Discharge: Family;Available 24 hours/day Type of Home: House Home Access: Ramped  entrance Home  Layout: Two level;Able to live on main level with bedroom/bathroom Bathroom Shower/Tub: Health visitor: Standard Home Adaptive Equipment: Straight cane;Bedside commode/3-in-1 Prior Function Level of Independence: Independent with assistive device(s) Able to Take Stairs?: Yes Driving: Yes Vocation: Full time employment Communication Communication: No difficulties    Cognition  Overall Cognitive Status: Appears within functional limits for tasks assessed/performed Arousal/Alertness: Awake/alert Orientation Level: Appears intact for tasks assessed Behavior During Session: [facility] for tasks performed    Extremity/Trunk Assessment Right Lower Extremity Assessment RLE ROM/Strength/Tone: Deficits RLE ROM/Strength/Tone Deficits: Pt is able to initiate approx 20-30 deg of knee flex, approx 20 deg of hip abd, ankle motions WFL RLE Coordination: WFL - gross motor Left Lower Extremity Assessment LLE ROM/Strength/Tone: WFL for tasks assessed LLE Coordination: WFL - gross motor Trunk Assessment Trunk Assessment: Kyphotic   Balance    End of Session PT - End of Session Activity Tolerance: Patient limited by fatigue;Patient tolerated treatment well Patient left: in chair;with call bell/phone within reach   Lessie Dings 08/23/2011, 12:59 PM

## 2011-08-24 LAB — BASIC METABOLIC PANEL
BUN: 10 mg/dL (ref 6–23)
CO2: 28 mEq/L (ref 19–32)
Calcium: 9.5 mg/dL (ref 8.4–10.5)
Chloride: 100 mEq/L (ref 96–112)
Creatinine, Ser: 0.9 mg/dL (ref 0.50–1.10)
GFR calc Af Amer: 76 mL/min — ABNORMAL LOW (ref >90)
GFR calc non Af Amer: 66 mL/min — ABNORMAL LOW (ref >90)
Glucose, Bld: 120 mg/dL — ABNORMAL HIGH (ref 70–99)
Potassium: 3.9 mEq/L (ref 3.5–5.1)
Sodium: 135 mEq/L (ref 135–145)

## 2011-08-24 LAB — CBC
HCT: 29.4 % — ABNORMAL LOW (ref 36.0–46.0)
Hemoglobin: 9.7 g/dL — ABNORMAL LOW (ref 12.0–15.0)
MCH: 31.2 pg (ref 26.0–34.0)
MCHC: 33 g/dL (ref 30.0–36.0)
MCV: 94.5 fL (ref 78.0–100.0)
Platelets: 170 10*3/uL (ref 150–400)
RBC: 3.11 MIL/uL — ABNORMAL LOW (ref 3.87–5.11)
RDW: 13.6 % (ref 11.5–15.5)
WBC: 9.4 10*3/uL (ref 4.0–10.5)

## 2011-08-24 MED ORDER — POLYSACCHARIDE IRON COMPLEX 150 MG PO CAPS
150.0000 mg | ORAL_CAPSULE | Freq: Every day | ORAL | Status: DC
  Administered 2011-08-24 – 2011-08-25 (×2): 150 mg via ORAL
  Filled 2011-08-24 (×2): qty 1

## 2011-08-24 NOTE — Progress Notes (Signed)
Physical Therapy Treatment Patient Details Name: Carla Wright MRN: [id number] DOB: Apr 09, 1945 Today's Date: 08/24/2011 Time: 1478-2956 PT Time Calculation (min): 39 min  PT Assessment / Plan / Recommendation Comments on Treatment Session  Pt progressing well with ambulation, continues to increase distance and well with exercises.     Follow Up Recommendations  Home health PT    Barriers to Discharge        Equipment Recommendations       Recommendations for Other Services    Frequency 7X/week   Plan Discharge plan remains appropriate    Precautions / Restrictions Precautions Precautions: Posterior Hip Precaution Booklet Issued: Yes (comment) Precaution Comments: Pt able to state 3/3 precautions Restrictions Weight Bearing Restrictions: No Other Position/Activity Restrictions: WBAT   Pertinent Vitals/Pain 5/10    Mobility  Bed Mobility Bed Mobility: Sit to Supine Sit to Supine: 3: Mod assist;HOB flat Details for Bed Mobility Assistance: Assist for B LE into bed with cues for UE placement/LE placement to adjust hips.  Transfers Transfers: Sit to Stand;Stand to Sit Sit to Stand: 5: Supervision;With upper extremity assist;With armrests;From chair/3-in-1 Stand to Sit: 5: Supervision;With upper extremity assist;To bed;To elevated surface Details for Transfer Assistance: Supervision for safety with min cues for LE managment/UE placement.  Ambulation/Gait Ambulation/Gait Assistance: 4: Min guard Ambulation Distance (Feet): 100 Feet Assistive device: Rolling walker Ambulation/Gait Assistance Details: Min cues for sequencing/technique.  Gait Pattern: Step-to pattern;Decreased stride length;Trunk flexed Gait velocity: decreased    Exercises Total Joint Exercises Ankle Circles/Pumps: AROM;Both;20 reps Quad Sets: Strengthening;AROM;Right;10 reps Gluteal Sets: Strengthening;Both;10 reps Short Arc Quad: AROM;Strengthening;Right;10 reps Heel Slides: AAROM;Right;10 reps Hip  ABduction/ADduction: AAROM;Right;10 reps   PT Diagnosis:    PT Problem List:   PT Treatment Interventions:     PT Goals Acute Rehab PT Goals PT Goal Formulation: With patient Time For Goal Achievement: 08/27/11 Potential to Achieve Goals: Good Pt will go Sit to Supine/Side: with supervision PT Goal: Sit to Supine/Side - Progress: Progressing toward goal Pt will go Sit to Stand: with supervision PT Goal: Sit to Stand - Progress: Progressing toward goal Pt will go Stand to Sit: with supervision PT Goal: Stand to Sit - Progress: Progressing toward goal Pt will Ambulate: 51 - 150 feet;with supervision;with least restrictive assistive device PT Goal: Ambulate - Progress: Progressing toward goal Pt will Perform Home Exercise Program: with supervision, verbal cues required/provided PT Goal: Perform Home Exercise Program - Progress: Progressing toward goal  Visit Information  Last PT Received On: 08/24/11 Assistance Needed: +1    Subjective Data  Subjective: I can tell my muscles are working Patient Stated Goal: to get home   Cognition  Overall Cognitive Status: Appears within functional limits for tasks assessed/performed Arousal/Alertness: Awake/alert Orientation Level: Appears intact for tasks assessed Behavior During Session: [facility] for tasks performed    Balance     End of Session PT - End of Session Activity Tolerance: Patient tolerated treatment well Patient left: in bed;with call bell/phone within reach;with family/visitor present Nurse Communication: Mobility status   GP     Page, Meribeth Mattes 08/24/2011, 3:18 PM

## 2011-08-24 NOTE — Progress Notes (Signed)
Subjective: 2 Days Post-Op Procedure(s) (LRB): TOTAL HIP ARTHROPLASTY (Right) Patient reports pain as mild.   Patient is well, and has had no acute complaints or problems Plan is to go Home after hospital stay.  Objective: Vital signs in last 24 hours: Temp:  [98.3 F (36.8 C)-98.6 F (37 C)] 98.3 F (36.8 C) (06/26 1418) Pulse Rate:  [95-99] 95  (06/26 1418) Resp:  [16-20] 16  (06/26 2000) BP: (99-130)/(58-71) 130/70 mmHg (06/26 1427) SpO2:  [95 %-100 %] 100 % (06/26 1600)  Intake/Output from previous day:  Intake/Output Summary (Last 24 hours) at 08/24/11 2223 Last data filed at 08/24/11 2145  Gross per 24 hour  Intake 1360.33 ml  Output   2325 ml  Net -964.67 ml    Intake/Output this shift: Total I/O In: 240 [P.O.:240] Out: 275 [Urine:275]  Labs:  [facility] 08/24/11 0423 08/23/11 0419  HGB 9.7* 10.3*    Basename 08/24/11 0423 08/23/11 0419  WBC 9.4 8.1  RBC 3.11* 3.31*  HCT 29.4* 31.2*  PLT 170 188    Basename 08/24/11 0423 08/23/11 0419  NA 135 137  K 3.9 4.0  CL 100 102  CO2 28 27  BUN 10 14  CREATININE 0.90 0.76  GLUCOSE 120* 146*  CALCIUM 9.5 9.3   No results found for this basename: LABPT:2,INR:2 in the last 72 hours  EXAM General - Patient is Alert, Appropriate and Oriented Extremity - Neurovascular intact Sensation intact distally Dorsiflexion/Plantar flexion intact Dressing/Incision - clean, dry, no drainage, healing Motor Function - intact, moving foot and toes well on exam.   Past Medical History  Diagnosis Date  . Obesity   . Allergy   . Diverticulosis   . Thyroid disease   . Actinic keratoses   . Glucose intolerance (impaired glucose tolerance)   . Gout   . Hypertension     EKG and clearance Dr Christena Flake on chart  . Hypothyroidism   . Sleep apnea     doesnt used machine in months-per Dr Susann Givens note- borderline  . Arthritis   . Cancer     basal cell removed from face    Assessment/Plan: 2 Days Post-Op Procedure(s)  (LRB): TOTAL HIP ARTHROPLASTY (Right) Principal Problem:  *OA (osteoarthritis) of hip   Up with therapy Plan for discharge tomorrow Discharge home with home health  DVT Prophylaxis - Xarelto Weight-Bearing as tolerated to right leg  Carla Wright 08/24/2011, 10:23 PM

## 2011-08-24 NOTE — Progress Notes (Signed)
Occupational Therapy Treatment Patient Details Name: Carla Wright MRN: [id number] DOB: 05-19-1945 Today's Date: 08/24/2011 Time: 7829-5621 OT Time Calculation (min): 22 min  OT Assessment / Plan / Recommendation Comments on Treatment Session Pt is able to use elongated 3:1 but fit is a little tight.  initially she will use her sisters.  She will need one herself when she returns to her own home.  If she chooses a wide model, she will need a removable back bar to fit over commode.      Follow Up Recommendations  Home health OT    Barriers to Discharge       Equipment Recommendations  Other (comment) (Pt will get elongated or wide 3:1 herself)    Recommendations for Other Services    Frequency Min 2X/week   Plan      Precautions / Restrictions Precautions Precautions: Posterior Hip Precaution Booklet Issued: Yes (comment) Precaution Comments: Continue to educate Restrictions Weight Bearing Restrictions: No Other Position/Activity Restrictions: WBAT   Pertinent Vitals/Pain 0 sitting and 5 with ambulation    ADL    Toilet Transfer: Performed;Min guard (min cues for thps) Toilet Transfer Method:  (ambulate) Toilet Transfer Equipment: Raised toilet seat with arms (or 3-in-1 over toilet) Tub/Shower Transfer: Performed;Min guard Tub/Shower Transfer Method: Science writer: Walk in shower Equipment Used: Rolling walker (pt has sock aid and long shoehorn at home)    OT Diagnosis: Generalized weakness  OT Problem List: Decreased strength;Decreased activity tolerance;Decreased knowledge of use of DME or AE;Decreased knowledge of precautions;Pain OT Treatment Interventions: Self-care/ADL training;DME and/or AE instruction;Patient/family education   OT Goals Acute Rehab OT Goals OT Goal Formulation: With patient Time For Goal Achievement: 08/31/11 Potential to Achieve Goals: Good ADL Goals Pt Will Transfer to Toilet: with  supervision;3-in-1;Ambulation ADL Goal: Toilet Transfer - Progress: Progressing toward goals Pt Will Perform Tub/Shower Transfer: Shower transfer;with min assist;Ambulation ADL Goal: Web designer - Progress: Met Miscellaneous OT Goals Miscellaneous OT Goal #1: Pt will complete LB ADLs with supervision, sit to stand with AE OT Goal: Miscellaneous Goal #1 - Progress: Goal set today  Visit Information  Last OT Received On: 08/24/11 Assistance Needed: +1    Subjective Data  Subjective: "I will stay with my sister for, she said 2 weeks, but it might be longer.  I have to be able to drive"   Prior Functioning  Home Living Lives With: Family (staying at sister's) Available Help at Discharge: Family;Available 24 hours/day Bathroom Shower/Tub: Health visitor: Standard (3:1 over it) Additional Comments: pt has tub at her house; will stay with sister initially Prior Function Level of Independence: Independent with assistive device(s) Driving: Yes Vocation: Full time employment Communication Communication: No difficulties    Cognition  Overall Cognitive Status: Appears within functional limits for tasks assessed/performed Arousal/Alertness: Awake/alert Orientation Level: Appears intact for tasks assessed Behavior During Session: [facility] for tasks performed    Mobility  Transfers Sit to Stand: 4: Min guard;From chair/3-in-1;With upper extremity assist Stand to Sit: 5: Supervision;With upper extremity assist;To bed Details for Transfer Assistance: Min/guard and supervision for safety with cues for hand placement and LE management maintain hip precautions.    Exercises    Balance    End of Session OT - End of Session Activity Tolerance: Patient tolerated treatment well Patient left: in chair;with call bell/phone within reach  GO     [facility] 08/24/2011, 11:56 AM Marica Otter, OTR/L [phone] 08/24/2011

## 2011-08-24 NOTE — Evaluation (Signed)
Occupational Therapy Evaluation Patient Details Name: Carla Wright MRN: [id number] DOB: 10/31/1945 Today's Date: 08/24/2011 Time: 0865-7846 OT Time Calculation (min): 16 min  OT Assessment / Plan / Recommendation Clinical Impression  This 66 year old female is s/p THA with posterior THPs.  She will benefit from skilled OT to c omplete education to increase safety and independence with ADLs.      OT Assessment  Patient needs continued OT Services    Follow Up Recommendations  Home health OT    Barriers to Discharge      Equipment Recommendations  Rolling walker with 5" wheels;3 in 1 bedside comode    Recommendations for Other Services    Frequency  Min 2X/week    Precautions / Restrictions Precautions Precautions: Posterior Hip Precaution Booklet Issued: Yes (comment) Precaution Comments: Continue to educate Restrictions Weight Bearing Restrictions: No Other Position/Activity Restrictions: WBAT   Pertinent Vitals/Pain Sore, R hip; repositioned with ice    ADL  Eating/Feeding: Simulated;Independent Where Assessed - Eating/Feeding: Chair Grooming: Simulated;Set up Where Assessed - Grooming: Unsupported sitting Upper Body Bathing: Simulated;Set up Where Assessed - Upper Body Bathing: Supported sitting Lower Body Bathing: Simulated;Min guard (with AE) Where Assessed - Lower Body Bathing: Supported sit to stand Upper Body Dressing: Simulated;Minimal assistance (iv) Where Assessed - Upper Body Dressing: Supported sitting Lower Body Dressing: Simulated;Minimal assistance (with AE) Where Assessed - Lower Body Dressing: Supported sit to stand Equipment Used: Rolling walker (pt has sock aid and long shoehorn at home) Transfers/Ambulation Related to ADLs: Did not feel up to bathroom transfers as she just finished with PT ADL Comments: recalled 2/3 thps; pt doesn't cross legs.  Reviewed adls with thps    OT Diagnosis: Generalized weakness  OT Problem List: Decreased  strength;Decreased activity tolerance;Decreased knowledge of use of DME or AE;Decreased knowledge of precautions;Pain OT Treatment Interventions: Self-care/ADL training;DME and/or AE instruction;Patient/family education   OT Goals Acute Rehab OT Goals OT Goal Formulation: With patient Time For Goal Achievement: 08/31/11 Potential to Achieve Goals: Good ADL Goals Pt Will Transfer to Toilet: with supervision;3-in-1;Ambulation ADL Goal: Toilet Transfer - Progress: Goal set today Pt Will Perform Tub/Shower Transfer: Shower transfer;with min assist;Ambulation (with 3:1 min guard) ADL Goal: Tub/Shower Transfer - Progress: Goal set today Miscellaneous OT Goals Miscellaneous OT Goal #1: Pt will complete LB ADLs with supervision, sit to stand with AE OT Goal: Miscellaneous Goal #1 - Progress: Goal set today  Visit Information  Last OT Received On: 08/24/11 Assistance Needed: +1    Subjective Data  Subjective: "I will stay with my sister for, she said 2 weeks, but it might be longer.  I have to be able to drive"   Prior Functioning  Home Living Lives With: Family (staying at sister's) Available Help at Discharge: Family;Available 24 hours/day Bathroom Shower/Tub: Health visitor: Standard (3:1 over it) Additional Comments: pt has tub at her house; will stay with sister initially Prior Function Level of Independence: Independent with assistive device(s) Driving: Yes Vocation: Full time employment Communication Communication: No difficulties    Cognition  Overall Cognitive Status: Appears within functional limits for tasks assessed/performed Arousal/Alertness: Awake/alert Orientation Level: Appears intact for tasks assessed Behavior During Session: [facility] for tasks performed    Extremity/Trunk Assessment Right Upper Extremity Assessment RUE ROM/Strength/Tone: Within functional levels Left Upper Extremity Assessment LUE ROM/Strength/Tone: Within functional levels     Mobility Bed Mobility Bed Mobility: Supine to Sit;Sitting - Scoot to Edge of Bed Supine to Sit: 4: Min assist;HOB elevated;With rails  Sitting - Scoot to Edge of Bed: 5: Supervision Details for Bed Mobility Assistance: Assist for RLE out of bed with min cues for hand placement on bed.  Transfers Sit to Stand: 4: Min guard;From elevated surface;With upper extremity assist;From bed Stand to Sit: 5: Supervision;With upper extremity assist;To bed Details for Transfer Assistance: Min/guard and supervision for safety with cues for hand placement and LE management maintain hip precautions.    Exercise    Balance    End of Session OT - End of Session Activity Tolerance: Patient limited by fatigue (had just finished PT) Patient left: in chair;with call bell/phone within reach  GO     [facility] 08/24/2011, 9:08 AM Marica Otter, OTR/L [phone] 08/24/2011

## 2011-08-24 NOTE — Progress Notes (Signed)
Physical Therapy Treatment Patient Details Name: Carla Wright MRN: [id number] DOB: 07-08-1945 Today's Date: 08/24/2011 Time: 4098-1191 PT Time Calculation (min): 24 min  PT Assessment / Plan / Recommendation Comments on Treatment Session  Pt continues to progress well with increasing ambulation distance with only some increase in pain.     Follow Up Recommendations  Home health PT    Barriers to Discharge        Equipment Recommendations  Rolling walker with 5" wheels    Recommendations for Other Services    Frequency 7X/week   Plan Discharge plan remains appropriate    Precautions / Restrictions Precautions Precautions: Posterior Hip Precaution Booklet Issued: Yes (comment) Precaution Comments: Continue to educate Restrictions Weight Bearing Restrictions: No Other Position/Activity Restrictions: WBAT   Pertinent Vitals/Pain 5/10    Mobility  Bed Mobility Bed Mobility: Supine to Sit;Sitting - Scoot to Edge of Bed Supine to Sit: 4: Min assist;HOB elevated;With rails Sitting - Scoot to Edge of Bed: 5: Supervision Details for Bed Mobility Assistance: Assist for RLE out of bed with min cues for hand placement on bed.  Transfers Transfers: Sit to Stand;Stand to Sit Sit to Stand: 4: Min guard;From elevated surface;With upper extremity assist;From bed Stand to Sit: 5: Supervision;With upper extremity assist;To bed Details for Transfer Assistance: Min/guard and supervision for safety with cues for hand placement and LE management maintain hip precautions.  Ambulation/Gait Ambulation/Gait Assistance: 4: Min guard Ambulation Distance (Feet): 80 Feet Assistive device: Rolling walker Ambulation/Gait Assistance Details: Cues for sequencing/technique with RW and for upright posture.  Gait Pattern: Step-to pattern;Decreased stride length;Trunk flexed Gait velocity: decreased Stairs: No Wheelchair Mobility Wheelchair Mobility: No    Exercises     PT Diagnosis:    PT Problem  List:   PT Treatment Interventions:     PT Goals Acute Rehab PT Goals PT Goal Formulation: With patient Time For Goal Achievement: 08/27/11 Potential to Achieve Goals: Good Pt will go Supine/Side to Sit: with supervision PT Goal: Supine/Side to Sit - Progress: Progressing toward goal Pt will go Sit to Stand: with supervision PT Goal: Sit to Stand - Progress: Progressing toward goal Pt will go Stand to Sit: with supervision PT Goal: Stand to Sit - Progress: Progressing toward goal Pt will Ambulate: 51 - 150 feet;with supervision;with least restrictive assistive device PT Goal: Ambulate - Progress: Progressing toward goal  Visit Information  Last PT Received On: 08/24/11 Assistance Needed: +1    Subjective Data  Subjective: I slept great last night Patient Stated Goal: to get home   Cognition  Overall Cognitive Status: Appears within functional limits for tasks assessed/performed Arousal/Alertness: Awake/alert Orientation Level: Appears intact for tasks assessed Behavior During Session: [facility] for tasks performed    Balance     End of Session PT - End of Session Activity Tolerance: Patient tolerated treatment well Patient left: in chair;with call bell/phone within reach Nurse Communication: Mobility status   GP     Wright, Carla Mattes 08/24/2011, 8:49 AM

## 2011-08-25 LAB — CBC
HCT: 27.7 % — ABNORMAL LOW (ref 36.0–46.0)
Hemoglobin: 9.3 g/dL — ABNORMAL LOW (ref 12.0–15.0)
MCH: 31.4 pg (ref 26.0–34.0)
MCHC: 33.6 g/dL (ref 30.0–36.0)
MCV: 93.6 fL (ref 78.0–100.0)
Platelets: 169 10*3/uL (ref 150–400)
RBC: 2.96 MIL/uL — ABNORMAL LOW (ref 3.87–5.11)
RDW: 13.4 % (ref 11.5–15.5)
WBC: 9.8 10*3/uL (ref 4.0–10.5)

## 2011-08-25 MED ORDER — OXYCODONE HCL 5 MG PO TABS: 5.0000 mg | ORAL_TABLET | ORAL | Status: AC | PRN

## 2011-08-25 MED ORDER — METHOCARBAMOL 500 MG PO TABS: 500.0000 mg | ORAL_TABLET | Freq: Four times a day (QID) | ORAL | Status: AC | PRN

## 2011-08-25 MED ORDER — RIVAROXABAN 10 MG PO TABS: 10.0000 mg | ORAL_TABLET | Freq: Every day | ORAL | Status: DC

## 2011-08-25 NOTE — Progress Notes (Signed)
Subjective: 3 Days Post-Op Procedure(s) (LRB): TOTAL HIP ARTHROPLASTY (Right) Patient reports pain as mild.   Patient seen in rounds by Dr. Lequita Halt. Patient is well, and has had no acute complaints or problems Patient is ready to go home.  Objective: Vital signs in last 24 hours: Temp:  [98.1 F (36.7 C)-98.3 F (36.8 C)] 98.1 F (36.7 C) (06/27 0645) Pulse Rate:  [90-95] 90  (06/27 0645) Resp:  [16-20] 16  (06/27 0645) BP: (99-130)/(58-72) 110/72 mmHg (06/27 0645) SpO2:  [96 %-100 %] 96 % (06/27 0645)  Intake/Output from previous day:  Intake/Output Summary (Last 24 hours) at 08/25/11 0913 Last data filed at 08/25/11 0645  Gross per 24 hour  Intake 1080.33 ml  Output   2150 ml  Net -1069.67 ml    Intake/Output this shift:    Labs:  Basename 08/25/11 0444 08/24/11 0423 08/23/11 0419  HGB 9.3* 9.7* 10.3*    Basename 08/25/11 0444 08/24/11 0423  WBC 9.8 9.4  RBC 2.96* 3.11*  HCT 27.7* 29.4*  PLT 169 170    Basename 08/24/11 0423 08/23/11 0419  NA 135 137  K 3.9 4.0  CL 100 102  CO2 28 27  BUN 10 14  CREATININE 0.90 0.76  GLUCOSE 120* 146*  CALCIUM 9.5 9.3   No results found for this basename: LABPT:2,INR:2 in the last 72 hours  EXAM: General - Patient is Alert, Appropriate and Oriented Extremity - Neurovascular intact Sensation intact distally Dorsiflexion/Plantar flexion intact Incision - clean, dry, no drainage, healing Motor Function - intact, moving foot and toes well on exam.   Assessment/Plan: 3 Days Post-Op Procedure(s) (LRB): TOTAL HIP ARTHROPLASTY (Right) Procedure(s) (LRB): TOTAL HIP ARTHROPLASTY (Right) Past Medical History  Diagnosis Date  . Obesity   . Allergy   . Diverticulosis   . Thyroid disease   . Actinic keratoses   . Glucose intolerance (impaired glucose tolerance)   . Gout   . Hypertension     EKG and clearance Dr Christena Flake on chart  . Hypothyroidism   . Sleep apnea     doesnt used machine in months-per Dr Susann Givens  note- borderline  . Arthritis   . Cancer     basal cell removed from face   Principal Problem:  *OA (osteoarthritis) of hip   Up with therapy Discharge home with home health Diet - Cardiac diet Follow up - in 2 weeks Activity - WBAT Disposition - Home Condition Upon Discharge - Good D/C Meds - See DC Summary DVT Prophylaxis - Xarelto  Carla Wright 08/25/2011, 9:13 AM

## 2011-08-25 NOTE — Progress Notes (Signed)
CARE MANAGEMENT NOTE 08/25/2011  Patient:  Carla Wright, BOZA   Account Number:  [id number]  Date Initiated:  08/23/2011  Documentation initiated by:  Colleen Can  Subjective/Objective Assessment:   dx osteoarthritis right hip; total hip replacement     Action/Plan:   CM spoke with patient and sister(who will be caqregiver). Plans are that pt will go to sister's home in [facility]. Already has raised toilet seat but will need RW.   Anticipated DC Date:  08/25/2011   Anticipated DC Plan:  HOME W HOME HEALTH SERVICES  In-house referral  NA      DC Planning Services  CM consult  DC out of service area      [facility] Choice  HOME HEALTH  DURABLE MEDICAL EQUIPMENT   Choice offered to / List presented to:  C-1 Patient   DME arranged  Levan Hurst      DME agency  [facility]     HH arranged  HH-2 PT      [facility] agency  [facility]   Status of service:  Completed, signed off Medicare Important Message given?  NA - LOS <3 / Initial given by admissions (If response is "NO", the following Medicare IM given date fields will be blank) Date Medicare IM given:   Date Additional Medicare IM given:    Discharge Disposition:  HOME W HOME HEALTH SERVICES Comments:  08/25/2011 Raynelle Bring BSN CCM [phone] PT FOR DISCHARGE TODAY; [facility] NOTIFIED/SERVICES WILL STARTTOMORROW.dISCHARGE SUMMARY FAXED AT REQUEST.

## 2011-08-25 NOTE — Discharge Summary (Signed)
Physician Discharge Summary   Patient ID: MITRA DULING MRN: [id number] DOB/AGE: December 24, 1945 66 y.o.  Admit date: 08/22/2011 Discharge date: 08/25/2011  Primary Diagnosis: Osteoarthritis Right Hip   Admission Diagnoses:  Past Medical History  Diagnosis Date  . Obesity   . Allergy   . Diverticulosis   . Thyroid disease   . Actinic keratoses   . Glucose intolerance (impaired glucose tolerance)   . Gout   . Hypertension     EKG and clearance Dr Christena Flake on chart  . Hypothyroidism   . Sleep apnea     doesnt used machine in months-per Dr Susann Givens note- borderline  . Arthritis   . Cancer     basal cell removed from face   Discharge Diagnoses:   Principal Problem:  *OA (osteoarthritis) of hip  Procedure: Procedure(s) (LRB): TOTAL HIP ARTHROPLASTY (Right)   Consults: None  HPI: GEORGIANNE GRITZ is a 66 y.o. female with end stage arthritis of her right hip with progressively worsening pain and dysfunction. Pain occurs with activity and rest including pain at night. She has tried analgesics, protected weight bearing and rest without benefit. Pain is too severe to attempt physical therapy. Radiographs demonstrate bone on bone arthritis with collapse of the femoral head.. She presents now for right THA.  Laboratory Data: Hospital Outpatient Visit on 08/15/2011  Component Date Value Range Status  . aPTT 08/15/2011 32  24 - 37 seconds Final  . WBC 08/15/2011 8.7  4.0 - 10.5 K/uL Final  . RBC 08/15/2011 4.25  3.87 - 5.11 MIL/uL Final  . Hemoglobin 08/15/2011 13.4  12.0 - 15.0 g/dL Final  . HCT 40/98/1191 40.1  36.0 - 46.0 % Final  . MCV 08/15/2011 94.4  78.0 - 100.0 fL Final  . MCH 08/15/2011 31.5  26.0 - 34.0 pg Final  . MCHC 08/15/2011 33.4  30.0 - 36.0 g/dL Final  . RDW 47/82/9562 13.5  11.5 - 15.5 % Final  . Platelets 08/15/2011 266  150 - 400 K/uL Final  . Sodium 08/15/2011 137  135 - 145 mEq/L Final  . Potassium 08/15/2011 3.6  3.5 - 5.1 mEq/L Final  . Chloride 08/15/2011 98  96  - 112 mEq/L Final  . CO2 08/15/2011 25  19 - 32 mEq/L Final  . Glucose, Bld 08/15/2011 84  70 - 99 mg/dL Final  . BUN 13/09/6576 26* 6 - 23 mg/dL Final  . Creatinine, Ser 08/15/2011 0.91  0.50 - 1.10 mg/dL Final  . Calcium 46/96/2952 10.8* 8.4 - 10.5 mg/dL Final  . Total Protein 08/15/2011 7.7  6.0 - 8.3 g/dL Final  . Albumin 84/13/2440 4.2  3.5 - 5.2 g/dL Final  . AST 12/25/2534 14  0 - 37 U/L Final  . ALT 08/15/2011 10  0 - 35 U/L Final  . Alkaline Phosphatase 08/15/2011 89  39 - 117 U/L Final  . Total Bilirubin 08/15/2011 0.3  0.3 - 1.2 mg/dL Final  . GFR calc non Af Amer 08/15/2011 65* >90 mL/min Final  . GFR calc Af Amer 08/15/2011 75* >90 mL/min Final   Comment:                                 The eGFR has been calculated                          using the CKD EPI equation.  This calculation has not been                          validated in all clinical                          situations.                          eGFR's persistently                          <90 mL/min signify                          possible Chronic Kidney Disease.  Marland Kitchen Prothrombin Time 08/15/2011 13.6  11.6 - 15.2 seconds Final  . INR 08/15/2011 1.02  0.00 - 1.49 Final  . Color, Urine 08/15/2011 YELLOW  YELLOW Final  . APPearance 08/15/2011 CLEAR  CLEAR Final  . Specific Gravity, Urine 08/15/2011 1.019  1.005 - 1.030 Final  . pH 08/15/2011 6.0  5.0 - 8.0 Final  . Glucose, UA 08/15/2011 NEGATIVE  NEGATIVE mg/dL Final  . Hgb urine dipstick 08/15/2011 NEGATIVE  NEGATIVE Final  . Bilirubin Urine 08/15/2011 NEGATIVE  NEGATIVE Final  . Ketones, ur 08/15/2011 NEGATIVE  NEGATIVE mg/dL Final  . Protein, ur 16/11/9602 NEGATIVE  NEGATIVE mg/dL Final  . Urobilinogen, UA 08/15/2011 0.2  0.0 - 1.0 mg/dL Final  . Nitrite 54/10/8117 NEGATIVE  NEGATIVE Final  . Leukocytes, UA 08/15/2011 TRACE* NEGATIVE Final  . MRSA, PCR 08/15/2011 POSITIVE* NEGATIVE Final  . Staphylococcus aureus 08/15/2011  POSITIVE* NEGATIVE Final   Comment:                                 The Xpert SA Assay (FDA                          approved for NASAL specimens                          only), is one component of                          a comprehensive surveillance                          program.  It is not intended                          to diagnose infection nor to                          guide or monitor treatment.  . ABO/RH(D) 08/15/2011 O POS   Final  . Antibody Screen 08/15/2011 NEG   Final  . Sample Expiration 08/15/2011 08/25/2011   Final  . Squamous Epithelial / LPF 08/15/2011 FEW* RARE Final  . WBC, UA 08/15/2011 3-6  <3 WBC/hpf Final  . Bacteria, UA 08/15/2011 FEW* RARE Final  . Urine-Other 08/15/2011 MUCOUS PRESENT   Final  . ABO/RH(D) 08/15/2011 O POS   Final    Basename 08/25/11 1478 08/24/11 0423 08/23/11 2956  HGB 9.3* 9.7* 10.3*    Basename 08/25/11 0444 08/24/11 0423  WBC 9.8 9.4  RBC 2.96* 3.11*  HCT 27.7* 29.4*  PLT 169 170    Basename 08/24/11 0423 08/23/11 0419  NA 135 137  K 3.9 4.0  CL 100 102  CO2 28 27  BUN 10 14  CREATININE 0.90 0.76  GLUCOSE 120* 146*  CALCIUM 9.5 9.3   No results found for this basename: LABPT:2,INR:2 in the last 72 hours  X-Rays:Dg Chest 2 View  08/15/2011  *RADIOLOGY REPORT*  Clinical Data: Preop for hip replacement  CHEST - 2 VIEW  Comparison: None.  Findings: Normal heart, mediastinal, and hilar contours.  The lungs are normally expanded and clear.  There is no pleural effusion or pneumothorax.  No acute bony abnormality.  IMPRESSION: No acute cardiopulmonary disease.  Original Report Authenticated By: Britta Mccreedy, M.D.   Dg Hip Complete Right  08/15/2011  *RADIOLOGY REPORT*  Clinical Data:  Right hip pain, preoperative assessment for right hip replacement  RIGHT HIP - COMPLETE 2+ VIEW  Comparison: 05/13/2010  Findings: Osseous demineralization. Severe degenerative changes of the right hip joint with joint space obliteration,  spur formation, subchondral cyst formation, and new destruction of the cranial aspect of the right femoral head. Superolateral subluxation of right femoral head. Severe degenerative changes of left hip also present though the femoral head morphology appears maintained. SI joints symmetric. No fracture or dislocation identified.  IMPRESSION: Severe degenerative changes of bilateral hip joints, with marked progression of right hip changes since prior study now with associated destruction of the right femoral head and superolateral subluxation.  Original Report Authenticated By: Lollie Marrow, M.D.   Dg Pelvis Portable  08/22/2011  *RADIOLOGY REPORT*  Clinical Data: 66 year old female status post right hip arthroplasty.  PORTABLE PELVIS, PORTABLE RIGHT HIP - 1 VIEW  Comparison: Preoperative study 08/15/2011.  Findings: Pelvis:  AP portable view at 1813 hours.  Right bipolar hip arthroplasty.  Components appear normally aligned on this view. Postoperative drain in place.  No unexpected osseous changes. Severe degenerative changes at the left hip again noted.  AP hip:  Portable view at 1817 hours.  The entire right femoral implant now is visible.  Hardware appears intact normally aligned. Postoperative drain in place.  No unexpected osseous changes.  IMPRESSION: Right hip arthroplasty with no adverse features.  Original Report Authenticated By: Harley Hallmark, M.D.   Dg Hip Portable 1 View Right  08/22/2011  *RADIOLOGY REPORT*  Clinical Data: 66 year old female status post right hip arthroplasty.  PORTABLE PELVIS, PORTABLE RIGHT HIP - 1 VIEW  Comparison: Preoperative study 08/15/2011.  Findings: Pelvis:  AP portable view at 1813 hours.  Right bipolar hip arthroplasty.  Components appear normally aligned on this view. Postoperative drain in place.  No unexpected osseous changes. Severe degenerative changes at the left hip again noted.  AP hip:  Portable view at 1817 hours.  The entire right femoral implant now is  visible.  Hardware appears intact normally aligned. Postoperative drain in place.  No unexpected osseous changes.  IMPRESSION: Right hip arthroplasty with no adverse features.  Original Report Authenticated By: Harley Hallmark, M.D.    EKG:No orders found for this or any previous visit.   Hospital Course: Patient was admitted to [facility] and taken to the OR and underwent the above state procedure without complications.  Patient tolerated the procedure well and was later transferred to the recovery room and then to the orthopaedic floor for  postoperative care.  They were given PO and IV analgesics for pain control following their surgery.  They were given 24 hours of postoperative antibiotics and started on DVT prophylaxis in the form of Xarelto.   PT and OT were ordered for total hip protocol.  The patient was allowed to be PWB with therapy. Discharge planning was consulted to help with postop disposition and equipment needs.  Patient had a good night on the evening of surgery and started to get up OOB with therapy on day one.  Hemovac drain was pulled without difficulty.  The knee immobilizer was removed and discontinued.  Continued to work with therapy into day two.  Dressing was changed on day two and the incision was healing well.  By day three, the patient had progressed with therapy and meeting their goals.  Incision was healing well.  Patient was seen in rounds and was ready to go home.  Discharge Medications: Prior to Admission medications   Medication Sig Start Date End Date Taking? Authorizing Provider  allopurinol (ZYLOPRIM) 300 MG tablet TAKE 1 TABLET DAILY 06/25/11  Yes Ronnald Nian, MD  levothyroxine (SYNTHROID, LEVOTHROID) 88 MCG tablet TAKE 1 TABLET DAILY 06/25/11  Yes Ronnald Nian, MD  lisinopril-hydrochlorothiazide [facility]) 20-12.5 MG per tablet TAKE 1 TABLET DAILY 06/13/11  Yes Ronnald Nian, MD  traMADol-acetaminophen (ULTRACET) 37.5-325 MG per tablet Take 2  tablets by mouth every 6 (six) hours as needed. pain   Yes Historical Provider, MD  methocarbamol (ROBAXIN) 500 MG tablet Take 1 tablet (500 mg total) by mouth every 6 (six) hours as needed. 08/25/11 09/04/11  Lexandra Rettke, PA  oxyCODONE (OXY IR/ROXICODONE) 5 MG immediate release tablet Take 1-2 tablets (5-10 mg total) by mouth every 4 (four) hours as needed for pain. 08/25/11 09/04/11  Arleny Kruger Julien Girt, PA  rivaroxaban (XARELTO) 10 MG TABS tablet Take 1 tablet (10 mg total) by mouth daily with breakfast. Take Xarelto for two and a half more weeks, then discontinue Xarelto. Once the patient has completed the Xarelto, they may resume the 81 mg Aspirin. 08/25/11   Shontavia Mickel Julien Girt, PA    Diet: Cardiac diet Activity:WBAT No bending hip over 90 degrees- A "L" Angle Do not cross legs Do not let foot roll inward When turning these patients a pillow should be placed between the patient's legs to prevent crossing. Patients should have the affected knee fully extended when trying to sit or stand from all surfaces to prevent excessive hip flexion. When ambulating and turning toward the affected side the affected leg should have the toes turned out prior to moving the walker and the rest of patient's body as to prevent internal rotation/ turning in of the leg. Abduction pillows are the most effective way to prevent a patient from not crossing legs or turning toes in at rest. If an abduction pillow is not ordered placing a regular pillow length wise between the patient's legs is also an effective reminder. It is imperative that these precautions be maintained so that the surgical hip does not dislocate. Follow-up:in 2 weeks Disposition - Home Discharged Condition: good   Discharge Orders    Future Orders Please Complete By Expires   Diet - low sodium heart healthy      Diet Carb Modified      Call MD / Call 911      Comments:   If you experience chest pain or shortness of breath, CALL 911 and  be transported to the hospital emergency room.  If you develope a fever above 101 F, pus (white drainage) or increased drainage or redness at the wound, or calf pain, call your surgeon's office.   Discharge instructions      Comments:   Pick up stool softner and laxative for home. Do not submerge incision under water. May shower. Continue to use ice for pain and swelling from surgery. Hip precautions.  Total Hip Protocol.  Take Xarelto for two and a half more weeks, then discontinue Xarelto. Once the patient has completed the Xarelto, they may resume the 81 mg Aspirin.   Constipation Prevention      Comments:   Drink plenty of fluids.  Prune juice may be helpful.  You may use a stool softener, such as Colace (over the counter) 100 mg twice a day.  Use MiraLax (over the counter) for constipation as needed.   Increase activity slowly as tolerated      Patient may shower      Comments:   You may shower without a dressing once there is no drainage.  Do not wash over the wound.  If drainage remains, do not shower until drainage stops.   Driving restrictions      Comments:   No driving until released by the physician.   Lifting restrictions      Comments:   No lifting until released by the physician.   Follow the hip precautions as taught in Physical Therapy      Change dressing      Comments:   You may change your dressing dressing daily with sterile 4 x 4 inch gauze dressing and paper tape.  Do not submerge the incision under water.   TED hose      Comments:   Use stockings (TED hose) for 3 weeks on both leg(s).  You may remove them at night for sleeping.   Do not sit on low chairs, stoools or toilet seats, as it may be difficult to get up from low surfaces        Medication List  As of 08/25/2011  9:29 AM   STOP taking these medications         aspirin 81 MG tablet      B-complex with vitamin C tablet      cholecalciferol 1000 UNITS tablet      glucosamine-chondroitin 500-400  MG tablet      meloxicam 15 MG tablet      multivitamin with minerals Tabs         TAKE these medications         allopurinol 300 MG tablet   Commonly known as: ZYLOPRIM   TAKE 1 TABLET DAILY      levothyroxine 88 MCG tablet   Commonly known as: SYNTHROID, LEVOTHROID   TAKE 1 TABLET DAILY      lisinopril-hydrochlorothiazide 20-12.5 MG per tablet   Commonly known as: PRINZIDE,ZESTORETIC   TAKE 1 TABLET DAILY      methocarbamol 500 MG tablet   Commonly known as: ROBAXIN   Take 1 tablet (500 mg total) by mouth every 6 (six) hours as needed.      oxyCODONE 5 MG immediate release tablet   Commonly known as: Oxy IR/ROXICODONE   Take 1-2 tablets (5-10 mg total) by mouth every 4 (four) hours as needed for pain.      rivaroxaban 10 MG Tabs tablet   Commonly known as: XARELTO   Take 1 tablet (10 mg total) by mouth daily with breakfast. Take Xarelto  for two and a half more weeks, then discontinue Xarelto.  Once the patient has completed the Xarelto, they may resume the 81 mg Aspirin.      traMADol-acetaminophen 37.5-325 MG per tablet   Commonly known as: ULTRACET   Take 2 tablets by mouth every 6 (six) hours as needed. pain           Follow-up Information    Follow up with Loanne Drilling, MD. Schedule an appointment as soon as possible for a visit in 2 weeks.   Contact information:   [facility] [address] [place] [address] [phone]          Signed: Patrica Duel 08/25/2011, 9:29 AM

## 2011-08-25 NOTE — Progress Notes (Signed)
Physical Therapy Treatment Patient Details Name: Carla Wright MRN: [id number] DOB: Jul 15, 1945 Today's Date: 08/25/2011 Time: 6578-4696 PT Time Calculation (min): 15 min  PT Assessment / Plan / Recommendation Comments on Treatment Session  POD #3 pt plans to D/C to home today.    Follow Up Recommendations  Home health PT    Barriers to Discharge        Equipment Recommendations       Recommendations for Other Services    Frequency     Plan Discharge plan remains appropriate    Precautions / Restrictions   THP Pt recalled 3/3 WBAT   Pertinent Vitals/Pain C/o 3/10 ICE     Mobility  Bed Mobility Bed Mobility: Not assessed Details for Bed Mobility Assistance: Pt OOB in recliner Transfers Transfers: Sit to Stand;Stand to Sit Sit to Stand: 5: Supervision;From chair/3-in-1 Stand to Sit: 5: Supervision;To chair/3-in-1 Details for Transfer Assistance: increased time, good safety tech Ambulation/Gait Ambulation/Gait Assistance: 5: Supervision Ambulation Distance (Feet): 110 Feet Assistive device: Rolling walker Ambulation/Gait Assistance Details: good safety tech Gait Pattern: Step-to pattern;Decreased stride length;Trunk flexed Gait velocity: decreased Stairs: No (Pt has a ramp)     PT Goals       progressing    Visit Information  Last PT Received On: 08/25/11 Assistance Needed: +1                   End of Session PT - End of Session Equipment Utilized During Treatment: Gait belt Activity Tolerance: Patient tolerated treatment well Patient left: in chair;with call bell/phone within reach   Felecia Shelling  PTA [facility]  Acute  Rehab Pager     [phone]

## 2011-08-25 NOTE — Discharge Instructions (Signed)
Dr. Gaynelle Arabian Total Joint Specialist [facility] [address], [place], [place] [address] [phone]   TOTAL HIP REPLACEMENT POSTOPERATIVE DIRECTIONS    Hip Rehabilitation, Guidelines Following Surgery  The results of a hip operation are greatly improved after range of motion and muscle strengthening exercises. Follow all safety measures which are given to protect your hip. If any of these exercises cause increased pain or swelling in your joint, decrease the amount until you are comfortable again. Then slowly increase the exercises. Call your caregiver if you have problems or questions.  HOME CARE INSTRUCTIONS  Most of the following instructions are designed to prevent the dislocation of your new hip.  Remove items at home which could result in a fall. This includes throw rugs or furniture in walking pathways.  Continue medications as instructed at time of discharge.  You may have some home medications which will be placed on hold until you complete the course of blood thinner medication.  You may start showering once you are discharged home but do not submerge the incision under water. Just pat the incision dry and apply a dry gauze dressing on daily. Do not put on socks or shoes without following the instructions of your caregivers.  Sit on high chairs so your hips are not bent more than 90 degrees.  Sit on chairs with arms. Use the chair arms to help push yourself up when arising.  Keep your leg on the side of the operation out in front of you when standing up.  Arrange for the use of a toilet seat elevator so you are not sitting low.  Do not do any exercises or get in any positions that cause your toes to point in (pigeon toed).  Always sleep with a pillow between your legs. Do not lie on your side in sleep with both knees touching the bed.   Walk with walker as instructed.  You may resume a sexual relationship in one month or when given the OK by  your caregiver.  Use walker as long as suggested by your caregivers.  Begin partial weight bearing after surgery but do not advance weight bearing status until approved by your surgeon.  Avoid periods of inactivity such as sitting longer than an hour when not asleep. This helps prevent blood clots.  You may return to work once you are cleared by Engineer, production.  Do not drive a car for 6 weeks or until released by your surgeon.  Do not drive while taking narcotics.  Wear elastic stockings for three weeks following surgery during the day but you may remove then at night.  Make sure you keep all of your appointments after your operation with all of your doctors and caregivers.  Change the dressing daily and reapply a dry dressing each time. Please pick up a stool softener and laxative for home use as long as you are requiring pain medications.  Continue to use ice on the hip for pain and swelling from surgery. You may notice swelling that will progress down to the foot and ankle.  This is normal after  surgery.  Elevate the leg when you are not up walking on it.   It is important for you to complete the blood thinner medication as prescribed by your doctor.  Continue to use the breathing machine which will help keep your temperature  down.  It is common for your temperature to cycle up and down following surgery, especially at night when you are  not up moving around and exerting yourself.  The breathing machine keeps your lungs expanded and your temperature down.  RANGE OF MOTION AND STRENGTHENING EXERCISES  These exercises are designed to help you keep full movement of your hip joint. Follow your caregiver's or physical therapist's instructions. Perform all exercises about fifteen times, three times per day or as directed. Exercise both hips, even if you have had only one joint replacement. These exercises can be done on a training (exercise) mat, on the floor, on a table or on a bed. Use whatever  works the best and is most comfortable for you. Use music or television while you are exercising so that the exercises are a pleasant break in your day. This will make your life better with the exercises acting as a break in routine you can look forward to.  Lying on your back, slowly slide your foot toward your buttocks, raising your knee up off the floor. Then slowly slide your foot back down until your leg is straight again.  Lying on your back spread your legs as far apart as you can without causing discomfort.  Lying on your side, raise your upper leg and foot straight up from the floor as far as is comfortable. Slowly lower the leg and repeat.  Lying on your back, tighten up the muscle in the front of your thigh (quadriceps muscles). You can do this by keeping your leg straight and trying to raise your heel off the floor. This helps strengthen the largest muscle supporting your knee.  Lying on your back, tighten up the muscles of your buttocks both with the legs straight and with the knee bent at a comfortable angle while keeping your heel on the floor.   SKILLED REHAB INSTRUCTIONS: If the patient is transferred to a skilled rehab facility following release from the hospital, a list of the current medications will be sent to the facility for the patient to continue.  When discharged from the skilled rehab facility, please have the facility set up the patient's Home Health Physical Therapy prior to being released. Also, the skilled facility will be responsible for providing the patient with their medications at time of release from the facility to include their pain medication, the muscle relaxants, and their blood thinner medication. If the patient is still at the rehab facility at time of the two week follow up appointment, the skilled rehab facility will also need to assist the patient in arranging follow up appointment in our office and any transportation needs.  MAKE SURE YOU:  Understand these  instructions.  Will watch your condition.  Will get help right away if you are not doing well or get worse.  Document Released: 09/18/2003 Document Revised: 02/03/2011 Document Reviewed: 09/05/2007  [facility]: [facility]onda W Patient Information 2012 [place], [place].  Take Xarelto for two and a half more weeks, then discontinue Xarelto. Once the patient has completed the Xarelto, they may resume the 81 mg Aspirin.

## 2011-09-08 ENCOUNTER — Telehealth: Payer: Self-pay

## 2011-09-08 NOTE — Telephone Encounter (Signed)
Left message for pt to call and make appt to be cleared for surgery

## 2011-09-16 ENCOUNTER — Ambulatory Visit (INDEPENDENT_AMBULATORY_CARE_PROVIDER_SITE_OTHER): Payer: 59 | Admitting: Family Medicine

## 2011-09-16 ENCOUNTER — Encounter: Payer: Self-pay | Admitting: Family Medicine

## 2011-09-16 VITALS — BP 118/80 | HR 74 | Wt 239.0 lb

## 2011-09-16 DIAGNOSIS — E669 Obesity, unspecified: Secondary | ICD-10-CM

## 2011-09-16 DIAGNOSIS — R7302 Impaired glucose tolerance (oral): Secondary | ICD-10-CM

## 2011-09-16 DIAGNOSIS — R7309 Other abnormal glucose: Secondary | ICD-10-CM

## 2011-09-16 DIAGNOSIS — M129 Arthropathy, unspecified: Secondary | ICD-10-CM | POA: Diagnosis not present

## 2011-09-16 DIAGNOSIS — D649 Anemia, unspecified: Secondary | ICD-10-CM | POA: Diagnosis not present

## 2011-09-16 DIAGNOSIS — G4730 Sleep apnea, unspecified: Secondary | ICD-10-CM

## 2011-09-16 DIAGNOSIS — M1990 Unspecified osteoarthritis, unspecified site: Secondary | ICD-10-CM

## 2011-09-16 NOTE — Progress Notes (Signed)
  Subjective:    Patient ID: Carla Wright, female    DOB: Jan 12, 1946, 66 y.o.   MRN: [id number]  HPI She is here for surgical clearance. She recently had the right hip replaced and is scheduled for one in October. She is recovering quite nicely from this. Review his record indicates she does have an anemia that occurred after the surgery. She also has history of elevated blood sugars. The left hip is giving her some difficulty. She also has a history of sleep apnea however is not using the CPAP machine. She states she is sleeping upright which has helped. She has an underlying history of gout and is on allopurinol for this.she is also taking a multivitamin.   Review of Systems     Objective:   Physical Exam Alert and in no distress. Cardiac exam shows regular rhythm without murmurs or gallops. Lungs clear to auscultation. Hemoglobin A1cis 5.3.       Assessment & Plan:   1. Anemia    2. Arthritis    3. Glucose intolerance (impaired glucose tolerance)  HgB A1c  recommend she continue on her multivitamin and add extra iron.recommend she return here in approximately 2 months to truly get surgical clearance since it is too far away from [facility] comfortable at this time.

## 2011-11-28 ENCOUNTER — Institutional Professional Consult (permissible substitution): Payer: 59 | Admitting: Family Medicine

## 2011-11-28 ENCOUNTER — Encounter: Payer: Self-pay | Admitting: Family Medicine

## 2011-11-28 ENCOUNTER — Ambulatory Visit (INDEPENDENT_AMBULATORY_CARE_PROVIDER_SITE_OTHER): Payer: 59 | Admitting: Family Medicine

## 2011-11-28 VITALS — BP 122/80 | HR 84 | Wt 249.0 lb

## 2011-11-28 DIAGNOSIS — E039 Hypothyroidism, unspecified: Secondary | ICD-10-CM

## 2011-11-28 DIAGNOSIS — Z23 Encounter for immunization: Secondary | ICD-10-CM

## 2011-11-28 DIAGNOSIS — E669 Obesity, unspecified: Secondary | ICD-10-CM

## 2011-11-28 DIAGNOSIS — Z01818 Encounter for other preprocedural examination: Secondary | ICD-10-CM

## 2011-11-28 DIAGNOSIS — I10 Essential (primary) hypertension: Secondary | ICD-10-CM

## 2011-11-28 DIAGNOSIS — M169 Osteoarthritis of hip, unspecified: Secondary | ICD-10-CM

## 2011-11-28 DIAGNOSIS — M109 Gout, unspecified: Secondary | ICD-10-CM

## 2011-11-28 MED ORDER — INFLUENZA VIRUS VACC SPLIT PF IM SUSP: 0.5000 mL | Freq: Once | INTRAMUSCULAR | Status: DC

## 2011-11-28 NOTE — Progress Notes (Signed)
  Subjective:    Patient ID: Carla Wright, female    DOB: Apr 18, 1945, 66 y.o.   MRN: [id number]  HPI She is here for preoperative evaluation. She is now getting ready to have another hip replacement. She did go through one earlier this summer and did quite well. She is looking forward to having this done. This is doubly interfering with her quality of life. She continues on medications listed in the chart. Her blood work was reviewed. She has had no chest pain, shortness of breath, DOE. She continues on her thyroid medicine as well as gout medication. She's had no gouty arthritis in several years.  Review of Systems     Objective:   Physical Exam alert and in no distress. Tympanic membranes and canals are normal. Throat is clear. Tonsils are normal. Neck is supple without adenopathy or thyromegaly. Cardiac exam shows a regular sinus rhythm without murmurs or gallops. Lungs are clear to auscultation.        Assessment & Plan:   1. Need for prophylactic vaccination and inoculation against influenza  influenza  inactive virus vaccine (FLUZONE/FLUARIX) injection 0.5 mL  2. Obesity    3. OA (osteoarthritis) of hip    4. Hypothyroid    5. Hypertension    6. Gout    7. Preoperative evaluation to rule out surgical contraindication

## 2011-11-30 ENCOUNTER — Other Ambulatory Visit: Payer: Self-pay | Admitting: Orthopedic Surgery

## 2011-11-30 MED ORDER — BUPIVACAINE LIPOSOME 1.3 % IJ SUSP: 20.0000 mL | Freq: Once | INTRAMUSCULAR | Status: DC

## 2011-11-30 MED ORDER — DEXAMETHASONE SODIUM PHOSPHATE 10 MG/ML IJ SOLN: 10.0000 mg | Freq: Once | INTRAMUSCULAR | Status: DC

## 2011-11-30 NOTE — Progress Notes (Signed)
Preoperative surgical orders have been place into the Epic hospital system for Carla Wright on 11/30/2011, 4:13 PM  by Patrica Duel for surgery on 12/14/11.  Preop Total Hip orders including Experel Injecion, IV Tylenol, and IV Decadron as long as there are no contraindications to the above medications. Avel Peace, PA-C

## 2011-12-05 ENCOUNTER — Encounter (HOSPITAL_COMMUNITY): Payer: Self-pay | Admitting: Pharmacy Technician

## 2011-12-07 NOTE — Patient Instructions (Signed)
20 DALANA DOLD  12/07/2011   Your procedure is scheduled on:  12/14/11 1045am-1245pm  Report to [facility] at 0815 AM.  Call this number if you have problems the morning of surgery: [phone]   Remember:   Do not eat food:After Midnight.  May have clear liquids:until Midnight .    Take these medicines the morning of surgery with A SIP OF WATER:    Do not wear jewelry, make-up or nail polish.  Do not wear lotions, powders, or perfumes.   Do not shave 48 hours prior to surgery.  Do not bring valuables to the hospital.  Contacts, dentures or bridgework may not be worn into surgery.  Leave suitcase in the car. After surgery it may be brought to your room.  For patients admitted to the hospital, checkout time is 11:00 AM the day of discharge.      SEE CHG INSTRUCTION SHEET    Please read over the following fact sheets that you were given: MRSA Information, Blood Transfusion Fact Sheet, Incentive Spirometry Fact Sheet, coughing and deep breathing exercises, leg exercises

## 2011-12-08 ENCOUNTER — Encounter (HOSPITAL_COMMUNITY): Payer: Self-pay

## 2011-12-08 ENCOUNTER — Encounter (HOSPITAL_COMMUNITY)
Admission: RE | Admit: 2011-12-08 | Discharge: 2011-12-08 | Disposition: A | Discharge status: Home / Self Care | Payer: 59 | Source: Ambulatory Visit | Attending: Orthopedic Surgery | Admitting: Orthopedic Surgery

## 2011-12-08 ENCOUNTER — Ambulatory Visit (HOSPITAL_COMMUNITY)
Admission: RE | Admit: 2011-12-08 | Discharge: 2011-12-08 | Disposition: A | Discharge status: Home / Self Care | Payer: 59 | Source: Ambulatory Visit | Attending: Orthopedic Surgery | Admitting: Orthopedic Surgery

## 2011-12-08 DIAGNOSIS — Z01812 Encounter for preprocedural laboratory examination: Secondary | ICD-10-CM | POA: Insufficient documentation

## 2011-12-08 DIAGNOSIS — M1610 Unilateral primary osteoarthritis, unspecified hip: Secondary | ICD-10-CM | POA: Insufficient documentation

## 2011-12-08 DIAGNOSIS — M169 Osteoarthritis of hip, unspecified: Secondary | ICD-10-CM | POA: Insufficient documentation

## 2011-12-08 DIAGNOSIS — Z96649 Presence of unspecified artificial hip joint: Secondary | ICD-10-CM | POA: Insufficient documentation

## 2011-12-08 DIAGNOSIS — Z01818 Encounter for other preprocedural examination: Secondary | ICD-10-CM | POA: Insufficient documentation

## 2011-12-08 DIAGNOSIS — M25559 Pain in unspecified hip: Secondary | ICD-10-CM | POA: Insufficient documentation

## 2011-12-08 LAB — URINALYSIS, ROUTINE W REFLEX MICROSCOPIC
Bilirubin Urine: NEGATIVE
Glucose, UA: NEGATIVE mg/dL
Hgb urine dipstick: NEGATIVE
Ketones, ur: NEGATIVE mg/dL
Nitrite: POSITIVE — AB
Protein, ur: NEGATIVE mg/dL
Specific Gravity, Urine: 1.009 (ref 1.005–1.030)
Urobilinogen, UA: 0.2 mg/dL (ref 0.0–1.0)
pH: 6 (ref 5.0–8.0)

## 2011-12-08 LAB — APTT: aPTT: 32 seconds (ref 24–37)

## 2011-12-08 LAB — CBC
HCT: 39.6 % (ref 36.0–46.0)
Hemoglobin: 12.9 g/dL (ref 12.0–15.0)
MCH: 29.5 pg (ref 26.0–34.0)
MCHC: 32.6 g/dL (ref 30.0–36.0)
MCV: 90.4 fL (ref 78.0–100.0)
Platelets: 289 10*3/uL (ref 150–400)
RBC: 4.38 MIL/uL (ref 3.87–5.11)
RDW: 13.9 % (ref 11.5–15.5)
WBC: 7.6 10*3/uL (ref 4.0–10.5)

## 2011-12-08 LAB — COMPREHENSIVE METABOLIC PANEL
ALT: 10 U/L (ref 0–35)
AST: 14 U/L (ref 0–37)
Albumin: 3.7 g/dL (ref 3.5–5.2)
Alkaline Phosphatase: 93 U/L (ref 39–117)
BUN: 24 mg/dL — ABNORMAL HIGH (ref 6–23)
CO2: 29 mEq/L (ref 19–32)
Calcium: 10.3 mg/dL (ref 8.4–10.5)
Chloride: 97 mEq/L (ref 96–112)
Creatinine, Ser: 0.87 mg/dL (ref 0.50–1.10)
GFR calc Af Amer: 79 mL/min — ABNORMAL LOW (ref >90)
GFR calc non Af Amer: 68 mL/min — ABNORMAL LOW (ref >90)
Glucose, Bld: 85 mg/dL (ref 70–99)
Potassium: 3.8 mEq/L (ref 3.5–5.1)
Sodium: 135 mEq/L (ref 135–145)
Total Bilirubin: 0.3 mg/dL (ref 0.3–1.2)
Total Protein: 7.4 g/dL (ref 6.0–8.3)

## 2011-12-08 LAB — SURGICAL PCR SCREEN
MRSA, PCR: NEGATIVE
Staphylococcus aureus: NEGATIVE

## 2011-12-08 LAB — PROTIME-INR
INR: 1.04 (ref 0.00–1.49)
Prothrombin Time: 13.5 seconds (ref 11.6–15.2)

## 2011-12-08 LAB — URINE MICROSCOPIC-ADD ON

## 2011-12-08 NOTE — Progress Notes (Signed)
Abnormal BUN , urinalysis and micro result routed to Dr Lequita Halt by fax of EPIC.

## 2011-12-13 ENCOUNTER — Other Ambulatory Visit: Payer: Self-pay | Admitting: Orthopedic Surgery

## 2011-12-13 NOTE — H&P (Signed)
Carla Wright  DOB: 07/27/1945 Single / Language: English / Race: White Female  Date of Admission:  12/14/2011  Chief Complaint:  Left Hip Pain  History of Present Illness The patient is scheduled for a left total hip arthroplasty to be performed by Dr. Frank V. Aluisio, MD at [facility] on 12/14/2011. The patient is a 66 year old female several months out from a right total hip arthroplasty. The patient states that she is doing very well at this time.  With regards to her left hip, she describes their pain as mild. They are currently on Ultram and Oxycodone for their pain rarely at night. The patient is currently doing home exercise program. Her surgery is already scheduled for the left hip and ready to proceed with that surgery. They have been treated conservatively in the past for the above stated problem and despite conservative measures, they continue to have progressive pain and severe functional limitations and dysfunction. They have failed non-operative management. It is felt that they would benefit from undergoing total joint replacement. Risks and benefits of the procedure have been discussed with the patient and they elect to proceed with surgery. There are no active contraindications to surgery such as ongoing infection or rapidly progressive neurological disease.   Problem List/Past Medical S/P Right total hip arthroplasty (V43.64) Osteoarthrosis NOS, pelvis/thigh (715.95). 06/17/2010   Allergies No Known Drug Allergies   Family History Heart disease in female family member before age 65 Osteoarthritis. father and sister Hypertension. father Congestive Heart Failure. mother Heart Disease. mother Mother. deceased age 64 due to CHF Father. deceased age 83 due to heart disease   Social History Drug/Alcohol Rehab (Currently). no Exercise. Exercises never Current work status. working full time Alcohol use. current drinker; drinks wine and hard  liquor; only occasionally per week Children. 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 1 Marital status. single Illicit drug use. no Living situation. live alone Tobacco / smoke exposure. no Pain Contract. no Previously in rehab. no Tobacco use. former smoker; smoke(d) 1 pack(s) per day; quit 30 years ago Advance Directives. none Post-Surgical Plans. going to sister's house   Past Surgical History Foot Surgery. right 2012 Total Hip Replacement - Right Blephorplasty   Diagnostic Studies History EKG. history of bundle branch block    Past Medical History Hypothyroidism Osteoarthritis Gout Measles. childhood Skin Cancer. basal cell carcinoma on face Rubella. childhood Menopause Hypertension Sleep Apnea. does not use CPAP Diverticulosis Urinary Tract Infection   Vitals Weight: 249 lb Height: 65 in Weight was reported by patient. Height was reported by patient. Body Surface Area: 2.28 m Body Mass Index: 41.44 kg/m Pulse: 76 (Regular) Resp.: 14 (Unlabored) BP: 132/78 (Sitting, Left Arm, Standard)    Physical Exam The physical exam findings are as follows:   General Mental Status - Alert, cooperative and good historian. General Appearance- pleasant. Not in acute distress. Orientation- Oriented X3. Build & Nutrition- Obese and Well developed.   Head and Neck Head- normocephalic, atraumatic . Neck Global Assessment- supple. no bruit auscultated on the right and no bruit auscultated on the left.   Eye Pupil- Bilateral- Normal. Motion- Bilateral- EOMI. wears glasses  Chest and Lung Exam Auscultation: Breath sounds:- clear at anterior chest wall and - clear at posterior chest wall. Adventitious sounds:- No Adventitious sounds.   Cardiovascular Auscultation:Rhythm- Regular rate and rhythm. Heart Sounds- S1 WNL and S2 WNL. Murmurs & Other  Heart Sounds:Auscultation of the heart reveals -   No Murmurs.   Abdomen Palpation/Percussion:Tenderness- Abdomen is non-tender to palpation. Rigidity (guarding)- Abdomen is soft. Auscultation:Auscultation of the abdomen reveals - Bowel sounds normal.   Female Genitourinary Not done, not pertinent to present illness  Peripheral Vascular Upper Extremity: Palpation:- Pulses bilaterally normal. Lower Extremity: Palpation:- Pulses bilaterally normal.   Neurologic Examination of related systems reveals - normal muscle strength and tone in all extremities. Neurologic evaluation reveals - normal sensation and upper and lower extremity deep tendon reflexes intact bilaterally .   Musculoskeletal Lower Extremity Right Lower Extremity: Right Hip: Inspection and Palpation: Skin: Wound/Incision:Appearance- The incision is well healed with no erythema or exudates. ROM: Flexion:PROM- 110 . Abduction:PROM- 30 . Internal Rotation:PROM- 35 . External Rotation:PROM- 25 . Gait and Station: Evaluation of Gait:Gait Pattern- slight limp. Assistive Devices- no assistive devices. Weghtbearing- weightbearing as tolerated.  The left hip flexion 100, rotation in 10, out 20, abduction 20 with pain. No tenderness over the left troch at this time.  RADIOGRAPHS: AP pelvis, lateral of both hips, and she has horrendous arthritic change in the left hip. The left hip socket is bone on bone with no erosions.  Assessment & Plan S/P Right total hip arthroplasty (V43.64)  Osteoarthrosis NOS, pelvis/thigh (715.95)  Patient is for a left total hip replacement by Dr. Aluisio.  Plan is to go home with sister.  PCP - Dr. John Lalonde  Signed electronically by DREW L Sicilia Killough, PA-C 

## 2011-12-14 ENCOUNTER — Inpatient Hospital Stay (HOSPITAL_COMMUNITY): Payer: 59

## 2011-12-14 ENCOUNTER — Encounter (HOSPITAL_COMMUNITY): Payer: Self-pay | Admitting: *Deleted

## 2011-12-14 ENCOUNTER — Encounter (HOSPITAL_COMMUNITY): Payer: Self-pay | Admitting: Anesthesiology

## 2011-12-14 ENCOUNTER — Inpatient Hospital Stay (HOSPITAL_COMMUNITY): Payer: 59 | Admitting: Anesthesiology

## 2011-12-14 ENCOUNTER — Encounter (HOSPITAL_COMMUNITY)
Admission: RE | Disposition: A | Discharge status: Home Health | Payer: Self-pay | Source: Ambulatory Visit | Attending: Orthopedic Surgery

## 2011-12-14 ENCOUNTER — Inpatient Hospital Stay (HOSPITAL_COMMUNITY)
Admission: RE | Admit: 2011-12-14 | Discharge: 2011-12-17 | DRG: 470 | Disposition: A | Discharge status: Home Health | Payer: 59 | Source: Ambulatory Visit | Attending: Orthopedic Surgery | Admitting: Orthopedic Surgery

## 2011-12-14 DIAGNOSIS — M169 Osteoarthritis of hip, unspecified: Principal | ICD-10-CM | POA: Diagnosis present

## 2011-12-14 DIAGNOSIS — Z6841 Body Mass Index (BMI) 40.0 and over, adult: Secondary | ICD-10-CM

## 2011-12-14 DIAGNOSIS — I10 Essential (primary) hypertension: Secondary | ICD-10-CM | POA: Diagnosis present

## 2011-12-14 DIAGNOSIS — E871 Hypo-osmolality and hyponatremia: Secondary | ICD-10-CM | POA: Diagnosis not present

## 2011-12-14 DIAGNOSIS — E039 Hypothyroidism, unspecified: Secondary | ICD-10-CM | POA: Diagnosis present

## 2011-12-14 DIAGNOSIS — M1610 Unilateral primary osteoarthritis, unspecified hip: Principal | ICD-10-CM | POA: Diagnosis present

## 2011-12-14 DIAGNOSIS — D62 Acute posthemorrhagic anemia: Secondary | ICD-10-CM | POA: Diagnosis not present

## 2011-12-14 DIAGNOSIS — G4730 Sleep apnea, unspecified: Secondary | ICD-10-CM | POA: Diagnosis present

## 2011-12-14 HISTORY — PX: TOTAL HIP ARTHROPLASTY: SHX124

## 2011-12-14 LAB — TYPE AND SCREEN
ABO/RH(D): O POS
Antibody Screen: NEGATIVE

## 2011-12-14 SURGERY — ARTHROPLASTY, HIP, TOTAL,POSTERIOR APPROACH
Anesthesia: General | Site: Hip | Laterality: Left | Wound class: Clean

## 2011-12-14 MED ORDER — MORPHINE SULFATE 2 MG/ML IJ SOLN
1.0000 mg | INTRAMUSCULAR | Status: DC | PRN
  Administered 2011-12-14: 2 mg via INTRAVENOUS
  Filled 2011-12-14: qty 1

## 2011-12-14 MED ORDER — OXYCODONE HCL 5 MG PO TABS
5.0000 mg | ORAL_TABLET | ORAL | Status: DC | PRN
  Administered 2011-12-14 (×2): 5 mg via ORAL
  Administered 2011-12-15 (×5): 10 mg via ORAL
  Administered 2011-12-16: 5 mg via ORAL
  Administered 2011-12-16 – 2011-12-17 (×3): 10 mg via ORAL
  Filled 2011-12-14: qty 2
  Filled 2011-12-14: qty 1
  Filled 2011-12-14: qty 2
  Filled 2011-12-14: qty 1
  Filled 2011-12-14 (×5): qty 2
  Filled 2011-12-14: qty 1
  Filled 2011-12-14: qty 2

## 2011-12-14 MED ORDER — ACETAMINOPHEN 10 MG/ML IV SOLN
INTRAVENOUS | Status: DC | PRN
  Administered 2011-12-14: 1000 mg via INTRAVENOUS

## 2011-12-14 MED ORDER — CEFAZOLIN SODIUM-DEXTROSE 2-3 GM-% IV SOLR
2.0000 g | INTRAVENOUS | Status: AC
  Administered 2011-12-14: 2 g via INTRAVENOUS

## 2011-12-14 MED ORDER — BISACODYL 10 MG RE SUPP: 10.0000 mg | Freq: Every day | RECTAL | Status: DC | PRN

## 2011-12-14 MED ORDER — GLYCOPYRROLATE 0.2 MG/ML IJ SOLN
INTRAMUSCULAR | Status: DC | PRN
  Administered 2011-12-14: .7 mg via INTRAVENOUS

## 2011-12-14 MED ORDER — NEOSTIGMINE METHYLSULFATE 1 MG/ML IJ SOLN
INTRAMUSCULAR | Status: DC | PRN
  Administered 2011-12-14: 4 mg via INTRAVENOUS

## 2011-12-14 MED ORDER — TEMAZEPAM 15 MG PO CAPS: 15.0000 mg | ORAL_CAPSULE | Freq: Every evening | ORAL | Status: DC | PRN

## 2011-12-14 MED ORDER — BUPIVACAINE LIPOSOME 1.3 % IJ SUSP
20.0000 mL | Freq: Once | INTRAMUSCULAR | Status: AC
  Administered 2011-12-14: 20 mL
  Filled 2011-12-14: qty 20

## 2011-12-14 MED ORDER — FENTANYL CITRATE 0.05 MG/ML IJ SOLN
INTRAMUSCULAR | Status: DC | PRN
  Administered 2011-12-14: 50 ug via INTRAVENOUS
  Administered 2011-12-14: 100 ug via INTRAVENOUS
  Administered 2011-12-14 (×2): 50 ug via INTRAVENOUS

## 2011-12-14 MED ORDER — ACETAMINOPHEN 650 MG RE SUPP: 650.0000 mg | Freq: Four times a day (QID) | RECTAL | Status: DC | PRN

## 2011-12-14 MED ORDER — SODIUM CHLORIDE 0.9 % IV SOLN: INTRAVENOUS | Status: DC

## 2011-12-14 MED ORDER — LIDOCAINE HCL (CARDIAC) 20 MG/ML IV SOLN
INTRAVENOUS | Status: DC | PRN
  Administered 2011-12-14: 50 mg via INTRAVENOUS

## 2011-12-14 MED ORDER — PROPOFOL 10 MG/ML IV BOLUS
INTRAVENOUS | Status: DC | PRN
  Administered 2011-12-14: 200 mg via INTRAVENOUS

## 2011-12-14 MED ORDER — HYDROMORPHONE HCL PF 1 MG/ML IJ SOLN
0.2500 mg | INTRAMUSCULAR | Status: DC | PRN
  Administered 2011-12-14 (×4): 0.5 mg via INTRAVENOUS

## 2011-12-14 MED ORDER — 0.9 % SODIUM CHLORIDE (POUR BTL) OPTIME
TOPICAL | Status: DC | PRN
  Administered 2011-12-14: 1000 mL

## 2011-12-14 MED ORDER — LACTATED RINGERS IV SOLN: INTRAVENOUS | Status: DC

## 2011-12-14 MED ORDER — LEVOTHYROXINE SODIUM 88 MCG PO TABS
88.0000 ug | ORAL_TABLET | Freq: Every day | ORAL | Status: DC
  Administered 2011-12-15 – 2011-12-17 (×3): 88 ug via ORAL
  Filled 2011-12-14 (×4): qty 1

## 2011-12-14 MED ORDER — LACTATED RINGERS IV SOLN
INTRAVENOUS | Status: DC
  Administered 2011-12-14: 1000 mL via INTRAVENOUS

## 2011-12-14 MED ORDER — METHOCARBAMOL 100 MG/ML IJ SOLN
500.0000 mg | Freq: Four times a day (QID) | INTRAVENOUS | Status: DC | PRN
  Administered 2011-12-14 – 2011-12-15 (×3): 500 mg via INTRAVENOUS
  Filled 2011-12-14 (×4): qty 5

## 2011-12-14 MED ORDER — ONDANSETRON HCL 4 MG/2ML IJ SOLN: 4.0000 mg | Freq: Four times a day (QID) | INTRAMUSCULAR | Status: DC | PRN

## 2011-12-14 MED ORDER — SODIUM CHLORIDE 0.9 % IJ SOLN
INTRAMUSCULAR | Status: DC | PRN
  Administered 2011-12-14: 50 mL

## 2011-12-14 MED ORDER — CEFAZOLIN SODIUM-DEXTROSE 2-3 GM-% IV SOLR
2.0000 g | Freq: Four times a day (QID) | INTRAVENOUS | Status: AC
  Administered 2011-12-14 (×2): 2 g via INTRAVENOUS
  Filled 2011-12-14 (×2): qty 50

## 2011-12-14 MED ORDER — ALLOPURINOL 300 MG PO TABS
300.0000 mg | ORAL_TABLET | Freq: Every day | ORAL | Status: DC
  Administered 2011-12-15 – 2011-12-17 (×3): 300 mg via ORAL
  Filled 2011-12-14 (×3): qty 1

## 2011-12-14 MED ORDER — POLYETHYLENE GLYCOL 3350 17 G PO PACK: 17.0000 g | PACK | Freq: Every day | ORAL | Status: DC | PRN

## 2011-12-14 MED ORDER — ROCURONIUM BROMIDE 100 MG/10ML IV SOLN
INTRAVENOUS | Status: DC | PRN
  Administered 2011-12-14: 40 mg via INTRAVENOUS

## 2011-12-14 MED ORDER — DOCUSATE SODIUM 100 MG PO CAPS
100.0000 mg | ORAL_CAPSULE | Freq: Two times a day (BID) | ORAL | Status: DC
  Administered 2011-12-14 – 2011-12-17 (×6): 100 mg via ORAL

## 2011-12-14 MED ORDER — ACETAMINOPHEN 10 MG/ML IV SOLN
1000.0000 mg | Freq: Four times a day (QID) | INTRAVENOUS | Status: AC
  Administered 2011-12-14 – 2011-12-15 (×3): 1000 mg via INTRAVENOUS
  Filled 2011-12-14 (×5): qty 100

## 2011-12-14 MED ORDER — MIDAZOLAM HCL 5 MG/5ML IJ SOLN
INTRAMUSCULAR | Status: DC | PRN
  Administered 2011-12-14: 2 mg via INTRAVENOUS

## 2011-12-14 MED ORDER — ONDANSETRON HCL 4 MG/2ML IJ SOLN
INTRAMUSCULAR | Status: DC | PRN
  Administered 2011-12-14: 4 mg via INTRAVENOUS

## 2011-12-14 MED ORDER — FLEET ENEMA 7-19 GM/118ML RE ENEM: 1.0000 | ENEMA | Freq: Once | RECTAL | Status: AC | PRN

## 2011-12-14 MED ORDER — HYDROMORPHONE HCL PF 1 MG/ML IJ SOLN
INTRAMUSCULAR | Status: DC | PRN
  Administered 2011-12-14: 1 mg via INTRAVENOUS
  Administered 2011-12-14 (×4): 0.5 mg via INTRAVENOUS

## 2011-12-14 MED ORDER — ACETAMINOPHEN 10 MG/ML IV SOLN: 1000.0000 mg | Freq: Once | INTRAVENOUS | Status: DC

## 2011-12-14 MED ORDER — CHLORHEXIDINE GLUCONATE 4 % EX LIQD
60.0000 mL | Freq: Once | CUTANEOUS | Status: DC
  Filled 2011-12-14: qty 60

## 2011-12-14 MED ORDER — LABETALOL HCL 5 MG/ML IV SOLN
INTRAVENOUS | Status: DC | PRN
  Administered 2011-12-14: 2.5 mg via INTRAVENOUS

## 2011-12-14 MED ORDER — KCL IN DEXTROSE-NACL 20-5-0.9 MEQ/L-%-% IV SOLN
INTRAVENOUS | Status: DC
  Administered 2011-12-14 – 2011-12-15 (×2): via INTRAVENOUS
  Filled 2011-12-14 (×2): qty 1000

## 2011-12-14 MED ORDER — METHOCARBAMOL 500 MG PO TABS
500.0000 mg | ORAL_TABLET | Freq: Four times a day (QID) | ORAL | Status: DC | PRN
  Administered 2011-12-15 – 2011-12-17 (×6): 500 mg via ORAL
  Filled 2011-12-14 (×6): qty 1

## 2011-12-14 MED ORDER — PHENOL 1.4 % MT LIQD
1.0000 | OROMUCOSAL | Status: DC | PRN
  Filled 2011-12-14: qty 177

## 2011-12-14 MED ORDER — MENTHOL 3 MG MT LOZG
1.0000 | LOZENGE | OROMUCOSAL | Status: DC | PRN
  Filled 2011-12-14: qty 9

## 2011-12-14 MED ORDER — DIPHENHYDRAMINE HCL 12.5 MG/5ML PO ELIX: 12.5000 mg | ORAL_SOLUTION | ORAL | Status: DC | PRN

## 2011-12-14 MED ORDER — RIVAROXABAN 10 MG PO TABS
10.0000 mg | ORAL_TABLET | Freq: Every day | ORAL | Status: DC
  Administered 2011-12-15 – 2011-12-17 (×3): 10 mg via ORAL
  Filled 2011-12-14 (×4): qty 1

## 2011-12-14 MED ORDER — ACETAMINOPHEN 325 MG PO TABS
650.0000 mg | ORAL_TABLET | Freq: Four times a day (QID) | ORAL | Status: DC | PRN
  Administered 2011-12-15: 650 mg via ORAL
  Filled 2011-12-14: qty 2

## 2011-12-14 MED ORDER — METOCLOPRAMIDE HCL 5 MG/ML IJ SOLN: 5.0000 mg | Freq: Three times a day (TID) | INTRAMUSCULAR | Status: DC | PRN

## 2011-12-14 MED ORDER — METOCLOPRAMIDE HCL 10 MG PO TABS: 5.0000 mg | ORAL_TABLET | Freq: Three times a day (TID) | ORAL | Status: DC | PRN

## 2011-12-14 MED ORDER — TRAMADOL HCL 50 MG PO TABS: 50.0000 mg | ORAL_TABLET | Freq: Four times a day (QID) | ORAL | Status: DC | PRN

## 2011-12-14 MED ORDER — ONDANSETRON HCL 4 MG PO TABS: 4.0000 mg | ORAL_TABLET | Freq: Four times a day (QID) | ORAL | Status: DC | PRN

## 2011-12-14 SURGICAL SUPPLY — 49 items
BAG ZIPLOCK 12X15 (MISCELLANEOUS) ×2 IMPLANT
BIT DRILL 2.8X128 (BIT) ×2 IMPLANT
BLADE EXTENDED COATED 6.5IN (ELECTRODE) ×2 IMPLANT
BLADE SAW SAG 73X25 THK (BLADE) ×1
BLADE SAW SGTL 73X25 THK (BLADE) ×1 IMPLANT
CLOTH BEACON ORANGE TIMEOUT ST (SAFETY) ×2 IMPLANT
DECANTER SPIKE VIAL GLASS SM (MISCELLANEOUS) ×2 IMPLANT
DRAPE INCISE IOBAN 66X45 STRL (DRAPES) ×2 IMPLANT
DRAPE ORTHO SPLIT 77X108 STRL (DRAPES) ×2
DRAPE POUCH INSTRU U-SHP 10X18 (DRAPES) ×2 IMPLANT
DRAPE SURG ORHT 6 SPLT 77X108 (DRAPES) ×2 IMPLANT
DRAPE U-SHAPE 47X51 STRL (DRAPES) ×2 IMPLANT
DRSG ADAPTIC 3X8 NADH LF (GAUZE/BANDAGES/DRESSINGS) ×2 IMPLANT
DRSG MEPILEX BORDER 4X4 (GAUZE/BANDAGES/DRESSINGS) ×2 IMPLANT
DRSG MEPILEX BORDER 4X8 (GAUZE/BANDAGES/DRESSINGS) ×2 IMPLANT
DURAPREP 26ML APPLICATOR (WOUND CARE) ×2 IMPLANT
ELECT REM PT RETURN 9FT ADLT (ELECTROSURGICAL) ×2
ELECTRODE REM PT RTRN 9FT ADLT (ELECTROSURGICAL) ×1 IMPLANT
EVACUATOR 1/8 PVC DRAIN (DRAIN) ×2 IMPLANT
FACESHIELD LNG OPTICON STERILE (SAFETY) ×8 IMPLANT
GLOVE BIO SURGEON STRL SZ8 (GLOVE) ×2 IMPLANT
GLOVE BIOGEL PI IND STRL 8 (GLOVE) ×2 IMPLANT
GLOVE BIOGEL PI INDICATOR 8 (GLOVE) ×2
GLOVE ECLIPSE 8.0 STRL XLNG CF (GLOVE) ×2 IMPLANT
GLOVE SURG SS PI 6.5 STRL IVOR (GLOVE) ×4 IMPLANT
GOWN STRL NON-REIN LRG LVL3 (GOWN DISPOSABLE) ×4 IMPLANT
GOWN STRL REIN XL XLG (GOWN DISPOSABLE) ×2 IMPLANT
IMMOBILIZER KNEE 20 (SOFTGOODS)
IMMOBILIZER KNEE 20 THIGH 36 (SOFTGOODS) IMPLANT
KIT BASIN OR (CUSTOM PROCEDURE TRAY) ×2 IMPLANT
MANIFOLD NEPTUNE II (INSTRUMENTS) ×2 IMPLANT
NDL SAFETY ECLIPSE 18X1.5 (NEEDLE) ×1 IMPLANT
NEEDLE HYPO 18GX1.5 SHARP (NEEDLE) ×1
NS IRRIG 1000ML POUR BTL (IV SOLUTION) ×2 IMPLANT
PACK TOTAL JOINT (CUSTOM PROCEDURE TRAY) ×2 IMPLANT
PASSER SUT SWANSON 36MM LOOP (INSTRUMENTS) ×2 IMPLANT
POSITIONER SURGICAL ARM (MISCELLANEOUS) ×2 IMPLANT
SPONGE GAUZE 4X4 12PLY (GAUZE/BANDAGES/DRESSINGS) ×2 IMPLANT
STRIP CLOSURE SKIN 1/2X4 (GAUZE/BANDAGES/DRESSINGS) ×4 IMPLANT
SUT ETHIBOND NAB CT1 #1 30IN (SUTURE) ×4 IMPLANT
SUT MNCRL AB 4-0 PS2 18 (SUTURE) ×2 IMPLANT
SUT VIC AB 2-0 CT1 27 (SUTURE) ×3
SUT VIC AB 2-0 CT1 TAPERPNT 27 (SUTURE) ×3 IMPLANT
SUT VLOC 180 0 24IN GS25 (SUTURE) ×2 IMPLANT
SYR 50ML LL SCALE MARK (SYRINGE) ×2 IMPLANT
TOWEL OR 17X26 10 PK STRL BLUE (TOWEL DISPOSABLE) ×4 IMPLANT
TOWEL OR NON WOVEN STRL DISP B (DISPOSABLE) ×2 IMPLANT
TRAY FOLEY CATH 14FRSI W/METER (CATHETERS) ×2 IMPLANT
WATER STERILE IRR 1500ML POUR (IV SOLUTION) ×4 IMPLANT

## 2011-12-14 NOTE — H&P (View-Only) (Signed)
Carla Wright  DOB: 1945/03/31 Single / Language: [place] / Race: White Female  Date of Admission:  12/14/2011  Chief Complaint:  Left Hip Pain  History of Present Illness The patient is scheduled for a left total hip arthroplasty to be performed by Dr. [name]. Aluisio, MD at [facility] on 12/14/2011. The patient is a 66 year old female several months out from a right total hip arthroplasty. The patient states that she is doing very well at this time.  With regards to her left hip, she describes their pain as mild. They are currently on Ultram and Oxycodone for their pain rarely at night. The patient is currently doing home exercise program. Her surgery is already scheduled for the left hip and ready to proceed with that surgery. They have been treated conservatively in the past for the above stated problem and despite conservative measures, they continue to have progressive pain and severe functional limitations and dysfunction. They have failed non-operative management. It is felt that they would benefit from undergoing total joint replacement. Risks and benefits of the procedure have been discussed with the patient and they elect to proceed with surgery. There are no active contraindications to surgery such as ongoing infection or rapidly progressive neurological disease.   Problem List/Past Medical S/P Right total hip arthroplasty (V43.64) Osteoarthrosis NOS, pelvis/thigh (715.95). 06/17/2010   Allergies No Known Drug Allergies   Family History Heart disease in female family member before age 36 Osteoarthritis. father and sister Hypertension. father Congestive Heart Failure. mother Heart Disease. mother Mother. deceased age 69 due to CHF Father. deceased age 67 due to heart disease   Social History Drug/Alcohol Rehab (Currently). no Exercise. Exercises never Current work status. working full time Alcohol use. current drinker; drinks wine and hard  liquor; only occasionally per week Children. 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 1 Marital status. single Illicit drug use. no Living situation. live alone Tobacco / smoke exposure. no Pain Contract. no Previously in rehab. no Tobacco use. former smoker; smoke(d) 1 pack(s) per day; quit 30 years ago Advance Directives. none Post-Surgical Plans. going to sister's house   Past Surgical History Foot Surgery. right 2012 Total Hip Replacement - Right Blephorplasty   Diagnostic Studies History EKG. history of bundle branch block    Past Medical History Hypothyroidism Osteoarthritis Gout Measles. childhood Skin Cancer. basal cell carcinoma on face Rubella. childhood Menopause Hypertension Sleep Apnea. does not use CPAP Diverticulosis Urinary Tract Infection   Vitals Weight: 249 lb Height: 65 in Weight was reported by patient. Height was reported by patient. Body Surface Area: 2.28 m Body Mass Index: 41.44 kg/m Pulse: 76 (Regular) Resp.: 14 (Unlabored) BP: 132/78 (Sitting, Left Arm, Standard)    Physical Exam The physical exam findings are as follows:   General Mental Status - Alert, cooperative and good historian. General Appearance- pleasant. Not in acute distress. Orientation- Oriented X3. Build & Nutrition- Obese and Well developed.   Head and Neck Head- normocephalic, atraumatic . Neck Global Assessment- supple. no bruit auscultated on the right and no bruit auscultated on the left.   Eye Pupil- Bilateral- Normal. Motion- Bilateral- EOMI. wears glasses  Chest and Lung Exam Auscultation: Breath sounds:- clear at anterior chest wall and - clear at posterior chest wall. Adventitious sounds:- No Adventitious sounds.   Cardiovascular Auscultation:Rhythm- Regular rate and rhythm. Heart Sounds- S1 WNL and S2 WNL. Murmurs & Other  Heart Sounds:Auscultation of the heart reveals -  No Murmurs.   Abdomen Palpation/Percussion:Tenderness- Abdomen is non-tender to palpation. Rigidity (guarding)- Abdomen is soft. Auscultation:Auscultation of the abdomen reveals - Bowel sounds normal.   Female Genitourinary Not done, not pertinent to present illness  Peripheral Vascular Upper Extremity: Palpation:- Pulses bilaterally normal. Lower Extremity: Palpation:- Pulses bilaterally normal.   Neurologic Examination of related systems reveals - normal muscle strength and tone in all extremities. Neurologic evaluation reveals - normal sensation and upper and lower extremity deep tendon reflexes intact bilaterally .   Musculoskeletal Lower Extremity Right Lower Extremity: Right Hip: Inspection and Palpation: Skin: Wound/Incision:Appearance- The incision is well healed with no erythema or exudates. ROM: Flexion:PROM- 110 . Abduction:PROM- 30 . Internal Rotation:PROM- 35 . External Rotation:PROM- 25 . Gait and Station: Evaluation of Gait:Gait Pattern- slight limp. Assistive Devices- no assistive devices. Weghtbearing- weightbearing as tolerated.  The left hip flexion 100, rotation in 10, out 20, abduction 20 with pain. No tenderness over the left troch at this time.  RADIOGRAPHS: AP pelvis, lateral of both hips, and she has horrendous arthritic change in the left hip. The left hip socket is bone on bone with no erosions.  Assessment & Plan S/P Right total hip arthroplasty (V43.64)  Osteoarthrosis NOS, pelvis/thigh (715.95)  Patient is for a left total hip replacement by Dr. Lequita Halt.  Plan is to go home with sister.  PCP - Dr. [name]  Signed electronically by Roberts Gaudy, PA-C

## 2011-12-14 NOTE — Op Note (Signed)
Pre-operative diagnosis- Osteoarthritis Left hip  Post-operative diagnosis- Osteoarthritis  Left hip  Procedure-  LeftTotal Hip Arthroplasty  Surgeon- [name]. Gil Ingwersen, MD  Assistant- Leilani Able, PA-C   Anesthesia  General  EBL- 500   Drain Hemovac   Complication- None  Condition-PACU - hemodynamically stable.   Brief Clinical Note-  Carla Wright is a 66 y.o. female with end stage arthritis of her left hip with progressively worsening pain and dysfunction. Pain occurs with activity and rest including pain at night. She has tried analgesics, protected weight bearing and rest without benefit. Pain is too severe to attempt physical therapy. Radiographs demonstrate bone on bone arthritis with subchondral cyst formation. She presents now for left THA.  Procedure in detail-   The patient is brought into the operating room and placed on the operating table. After successful administration of General  anesthesia, the patient is placed in the  Right lateral decubitus position with the  Left side up and held in place with the hip positioner. The lower extremity is isolated from the perineum with plastic drapes and time-out is performed by the surgical team. The lower extremity is then prepped and draped in the usual sterile fashion. A short posterolateral incision is made with a ten blade through the subcutaneous tissue to the level of the fascia lata which is incised in line with the skin incision. The sciatic nerve is palpated and protected and the short external rotators and capsule are isolated from the femur. The hip is then dislocated and the center of the femoral head is marked. A trial prosthesis is placed such that the trial head corresponds to the center of the patients' native femoral head. The resection level is marked on the femoral neck and the resection is made with an oscillating saw. The femoral head is removed and femoral retractors placed to gain access to the femoral canal.  The canal finder is passed into the femoral canal and the canal is thoroughly irrigated with sterile saline to remove the fatty contents. Axial reaming is performed to 13.5  mm, proximal reaming to 18D  and the sleeve machined to a small. A 18 D small trial sleeve is placed into the proximal femur.      The femur is then retracted anteriorly to gain acetabular exposure. Acetabular retractors are placed and the labrum and osteophytes are removed, Acetabular reaming is performed to 49  mm and a 50  mm Pinnacle acetabular shell is placed in anatomic position with excellent purchase. Additional dome screws  were not needed. The permanent 32 mm neutral + 4 Marathon liner is placed into the acetabular shell.      The trial femur is then placed into the femoral canal. The size is 18 x 13  stem with a 36 + 8  neck and a 32 + 6 head with the neck version matching  the patients' native anteversion. The hip is reduced with excellent stability with full extension and full external rotation, 70 degrees flexion with 40 degrees adduction and 90 degrees internal rotation and 90 degrees of flexion with 70 degrees of internal rotation. The operative leg is placed on top of the non-operative leg and the leg lengths are found to be equal. The trials are then removed and the permanent implant of the same size is impacted into the femoral canal. The ceramic  femoral head of the same size as the trial is placed and the hip is reduced with the same stability parameters. The operative  leg is again placed on top of the non-operative leg and the leg lengths are found to be equal.      The wound is then copiously irrigated with saline solution and the capsule and short external rotators are re-attached to the femur through drill holes with Ethibond suture. The fascia lata is closed over a hemovac drain with #1 vicryl suture and the fascia lata, gluteal muscles and subcutaneous tissues are injected with Exparel 20ml diluted with saline 50ml.  The subcutaneous tissues are closed with #1 and2-0 vicryl and the subcuticular layer closed with running 4-0 Monocryl. The drain is hooked to suction, incision cleaned and dried, and steri-srips and a bulky sterile dressing applied. The limb is placed into a knee immobilizer and the patient is awakened and transported to recovery in stable condition.      Please note that a surgical assistant was a medical necessity for this procedure in order to perform it in a safe and expeditious manner. The assistant was necessary to provide retraction to the vital neurovascular structures and to retract and position the limb to allow for anatomic placement of the prosthetic components.  [name] Carla Hedeen, MD    12/14/2011, 11:42 AM

## 2011-12-14 NOTE — Anesthesia Preprocedure Evaluation (Addendum)
Anesthesia Evaluation  Patient identified by MRN, date of birth, ID band Patient awake    Reviewed: Allergy & Precautions, H&P , NPO status , Patient's Chart, lab work & pertinent test results  Airway Mallampati: III TM Distance: >3 FB Neck ROM: full    Dental No notable dental hx. (+) Teeth Intact and Dental Advisory Given   Pulmonary sleep apnea ,  breath sounds clear to auscultation  Pulmonary exam normal       Cardiovascular hypertension, Pt. on medications Rhythm:regular Rate:Normal     Neuro/Psych negative neurological ROS  negative psych ROS   GI/Hepatic negative GI ROS, Neg liver ROS,   Endo/Other  negative endocrine ROSHypothyroidism Morbid obesity  Renal/GU negative Renal ROS  negative genitourinary   Musculoskeletal   Abdominal   Peds  Hematology negative hematology ROS (+)   Anesthesia Other Findings   Reproductive/Obstetrics negative OB ROS                          Anesthesia Physical Anesthesia Plan  ASA: III  Anesthesia Plan: General   Post-op Pain Management:    Induction: Intravenous  Airway Management Planned: Oral ETT  Additional Equipment:   Intra-op Plan:   Post-operative Plan: Extubation in OR  Informed Consent: I have reviewed the patients History and Physical, chart, labs and discussed the procedure including the risks, benefits and alternatives for the proposed anesthesia with the patient or authorized representative who has indicated his/her understanding and acceptance.   Dental Advisory Given  Plan Discussed with: Surgeon  Anesthesia Plan Comments:         Anesthesia Quick Evaluation

## 2011-12-14 NOTE — Transfer of Care (Signed)
Immediate Anesthesia Transfer of Care Note  Patient: Carla Wright  Procedure(s) Performed: Procedure(s) (LRB): TOTAL HIP ARTHROPLASTY (Left)  Patient Location: PACU  Anesthesia Type: General  Level of Consciousness: sedated, patient cooperative and responds to stimulaton  Airway & Oxygen Therapy: Patient Spontanous Breathing and Patient connected to face mask oxgen  Post-op Assessment: Report given to PACU RN and Post -op Vital signs reviewed and stable  Post vital signs: Reviewed and stable  Complications: No apparent anesthesia complications

## 2011-12-14 NOTE — Anesthesia Postprocedure Evaluation (Signed)
  Anesthesia Post-op Note  Patient: Carla Wright  Procedure(s) Performed: Procedure(s) (LRB): TOTAL HIP ARTHROPLASTY (Left)  Patient Location: PACU  Anesthesia Type: General  Level of Consciousness: awake and alert   Airway and Oxygen Therapy: Patient Spontanous Breathing  Post-op Pain: mild  Post-op Assessment: Post-op Vital signs reviewed, Patient's Cardiovascular Status Stable, Respiratory Function Stable, Patent Airway and No signs of Nausea or vomiting  Post-op Vital Signs: stable  Complications: No apparent anesthesia complications

## 2011-12-14 NOTE — Addendum Note (Signed)
Addendum  created 12/14/11 1331 by Paris Lore, CRNA   Modules edited:Anesthesia Medication Administration

## 2011-12-14 NOTE — Plan of Care (Signed)
Problem: Consults Goal: Diagnosis- Total Joint Replacement Left total knee     

## 2011-12-14 NOTE — Interval H&P Note (Signed)
History and Physical Interval Note:  12/14/2011 9:58 AM  Carla Wright  has presented today for surgery, with the diagnosis of osteoarthritis left hip  The various methods of treatment have been discussed with the patient and family. After consideration of risks, benefits and other options for treatment, the patient has consented to  Procedure(s) (LRB) with comments: TOTAL HIP ARTHROPLASTY (Left) as a surgical intervention .  The patient's history has been reviewed, patient examined, no change in status, stable for surgery.  I have reviewed the patient's chart and labs.  Questions were answered to the patient's satisfaction.     Loanne Drilling

## 2011-12-15 ENCOUNTER — Encounter (HOSPITAL_COMMUNITY): Payer: Self-pay | Admitting: Orthopedic Surgery

## 2011-12-15 DIAGNOSIS — D62 Acute posthemorrhagic anemia: Secondary | ICD-10-CM | POA: Diagnosis not present

## 2011-12-15 LAB — BASIC METABOLIC PANEL
BUN: 17 mg/dL (ref 6–23)
CO2: 28 mEq/L (ref 19–32)
Calcium: 9.1 mg/dL (ref 8.4–10.5)
Chloride: 101 mEq/L (ref 96–112)
Creatinine, Ser: 0.87 mg/dL (ref 0.50–1.10)
GFR calc Af Amer: 79 mL/min — ABNORMAL LOW (ref >90)
GFR calc non Af Amer: 68 mL/min — ABNORMAL LOW (ref >90)
Glucose, Bld: 115 mg/dL — ABNORMAL HIGH (ref 70–99)
Potassium: 3.7 mEq/L (ref 3.5–5.1)
Sodium: 136 mEq/L (ref 135–145)

## 2011-12-15 LAB — CBC
HCT: 30.7 % — ABNORMAL LOW (ref 36.0–46.0)
Hemoglobin: 10.3 g/dL — ABNORMAL LOW (ref 12.0–15.0)
MCH: 30.5 pg (ref 26.0–34.0)
MCHC: 33.6 g/dL (ref 30.0–36.0)
MCV: 90.8 fL (ref 78.0–100.0)
Platelets: 194 10*3/uL (ref 150–400)
RBC: 3.38 MIL/uL — ABNORMAL LOW (ref 3.87–5.11)
RDW: 13.9 % (ref 11.5–15.5)
WBC: 8.6 10*3/uL (ref 4.0–10.5)

## 2011-12-15 NOTE — Progress Notes (Signed)
Physical Therapy Treatment Patient Details Name: Carla Wright MRN: [id number] DOB: [birth year] Today's Date: 12/15/2011 Time: 4098-1191 PT Time Calculation (min): 27 min  PT Assessment / Plan / Recommendation Comments on Treatment Session       Follow Up Recommendations  Home health PT     Does the patient have the potential to tolerate intense rehabilitation     Barriers to Discharge        Equipment Recommendations  None recommended by PT    Recommendations for Other Services OT consult  Frequency 7X/week   Plan Discharge plan remains appropriate    Precautions / Restrictions Precautions Precautions: Posterior Hip Precaution Comments: Pt recalls all THP Restrictions Weight Bearing Restrictions: No Other Position/Activity Restrictions: WBAT   Pertinent Vitals/Pain 4-5/10; premedicated, ice pack provided    Mobility  Bed Mobility Bed Mobility: Sit to Supine Sit to Supine: 3: Mod assist Details for Bed Mobility Assistance: cues for sequence and for use of R LE to self assist; increased time and multimodal cues to shift up and laterally in bed Transfers Transfers: Sit to Stand;Stand to Sit Sit to Stand: 3: Mod assist Stand to Sit: 3: Mod assist Details for Transfer Assistance: cues for use of UEs and for LE management Ambulation/Gait Ambulation/Gait Assistance: 4: Min assist;3: Mod assist Ambulation Distance (Feet): 54 Feet Assistive device: Rolling walker Ambulation/Gait Assistance Details: cues for sequence, posture, position from RW, stride length and ER on L Gait Pattern: Step-to pattern    Exercises     PT Diagnosis:    PT Problem List:   PT Treatment Interventions:     PT Goals Acute Rehab PT Goals PT Goal Formulation: With patient Time For Goal Achievement: 12/20/11 Potential to Achieve Goals: Good Pt will go Supine/Side to Sit: with supervision PT Goal: Supine/Side to Sit - Progress: Goal set today Pt will go Sit to Supine/Side: with  supervision PT Goal: Sit to Supine/Side - Progress: Goal set today Pt will go Sit to Stand: with supervision PT Goal: Sit to Stand - Progress: Progressing toward goal Pt will go Stand to Sit: with supervision PT Goal: Stand to Sit - Progress: Progressing toward goal Pt will Ambulate: 51 - 150 feet;with supervision;with rolling walker PT Goal: Ambulate - Progress: Progressing toward goal  Visit Information  Last PT Received On: 12/15/11 Assistance Needed: +1    Subjective Data  Subjective: I'm ready when you are Patient Stated Goal: Resume previous lifestyle with decreased pain   Cognition  Overall Cognitive Status: Appears within functional limits for tasks assessed/performed Arousal/Alertness: Awake/alert Orientation Level: Appears intact for tasks assessed Behavior During Session: [facility] for tasks performed    Balance     End of Session PT - End of Session Activity Tolerance: Patient tolerated treatment well Patient left: with call bell/phone within reach;in bed Nurse Communication: Mobility status   GP     Efrem Pitstick 12/15/2011, 3:34 PM

## 2011-12-15 NOTE — Progress Notes (Signed)
   Subjective: 1 Day Post-Op Procedure(s) (LRB): TOTAL HIP ARTHROPLASTY (Left) Patient reports pain as mild.   Patient seen in rounds by Dr. Lequita Halt. Patient is well, and has had no acute complaints or problems We will start therapy today.  Plan is to go Home after hospital stay.  Objective: Vital signs in last 24 hours: Temp:  [97.4 F (36.3 C)-99.3 F (37.4 C)] 99.3 F (37.4 C) (10/17 0617) Pulse Rate:  [65-103] 92  (10/17 0617) Resp:  [10-22] 17  (10/17 0745) BP: (95-137)/(42-81) 137/81 mmHg (10/17 0617) SpO2:  [100 %] 100 % (10/17 0617) Weight:  [112.946 kg (249 lb)] 112.946 kg (249 lb) (10/16 1445)  Intake/Output from previous day:  Intake/Output Summary (Last 24 hours) at 12/15/11 0822 Last data filed at 12/15/11 0752  Gross per 24 hour  Intake   4775 ml  Output   2235 ml  Net   2540 ml    Intake/Output this shift: Total I/O In: 240 [P.O.:240] Out: 300 [Urine:300]  Labs:  [facility] 12/15/11 0343  HGB 10.3*    Basename 12/15/11 0343  WBC 8.6  RBC 3.38*  HCT 30.7*  PLT 194    Basename 12/15/11 0343  NA 136  K 3.7  CL 101  CO2 28  BUN 17  CREATININE 0.87  GLUCOSE 115*  CALCIUM 9.1   No results found for this basename: LABPT:2,INR:2 in the last 72 hours  EXAM General - Patient is Alert, Appropriate and Oriented Extremity - Neurovascular intact Sensation intact distally Dressing - dressing C/D/I Motor Function - intact, moving foot and toes well on exam.  Hemovac pulled without difficulty.  Past Medical History  Diagnosis Date  . Obesity   . Allergy   . Diverticulosis   . Thyroid disease   . Actinic keratoses   . Glucose intolerance (impaired glucose tolerance)   . Gout   . Hypertension     EKG and clearance Dr Christena Flake on chart  . Hypothyroidism   . Sleep apnea     doesnt used machine in months-per Dr Susann Givens note- borderline  . Arthritis   . Cancer     basal cell removed from face    Assessment/Plan: 1 Day Post-Op Procedure(s)  (LRB): TOTAL HIP ARTHROPLASTY (Left) Active Problems:  Postop Acute blood loss anemia  Estimated Body mass index is 41.44 kg/(m^2) as calculated from the following:   Height as of this encounter: 5\' 5" (1.651 m).   Weight as of this encounter: 249 lb(112.946 kg). Advance diet Up with therapy Plan for discharge tomorrow Discharge home with home health  DVT Prophylaxis - Xarelto, 81 mg Aspirin on hold for now Weight Bearing As Tolerated left Leg D/C Knee Immobilizer Hemovac Pulled Begin Therapy Hip Preacutions No vaccines.  Carla Wright 12/15/2011, 8:22 AM

## 2011-12-15 NOTE — Progress Notes (Signed)
OT Screen Order received, chart reviewed. Spoke with pt who had other hip replaced 3 months ago. Pt has all necessary DME, is aware of how to use all AE and will be receiving help from family at d/c. Pt presents with no OT needs at this time. Will sign off.  Garrel Ridgel, OTR/L  Pager [phone] 12/15/2011

## 2011-12-15 NOTE — Evaluation (Signed)
Physical Therapy Evaluation Patient Details Name: Carla Wright MRN: [id number] DOB: 1946-01-05 Today's Date: 12/15/2011 Time: 9562-1308 PT Time Calculation (min): 31 min  PT Assessment / Plan / Recommendation Clinical Impression  Pt s/p L THR presents with decreased L LE strength/ROM and post THP limiting functional mobility    PT Assessment  Patient needs continued PT services    Follow Up Recommendations  Home health PT    Does the patient have the potential to tolerate intense rehabilitation      Barriers to Discharge None      Equipment Recommendations  None recommended by PT    Recommendations for Other Services OT consult   Frequency 7X/week    Precautions / Restrictions Precautions Precautions: Posterior Hip Precaution Comments: sign hung in room Restrictions Weight Bearing Restrictions: No Other Position/Activity Restrictions: WBAT   Pertinent Vitals/Pain 5/10; premedicated, ice pack provided      Mobility  Bed Mobility Bed Mobility: Supine to Sit Supine to Sit: 3: Mod assist Details for Bed Mobility Assistance: cues for sequence and for use of R LE to self assist Transfers Transfers: Sit to Stand;Stand to Sit Sit to Stand: 3: Mod assist Stand to Sit: 3: Mod assist Details for Transfer Assistance: cues for use of UEs and for LE management Ambulation/Gait Ambulation/Gait Assistance: 4: Min assist;3: Mod assist Ambulation Distance (Feet): 32 Feet Assistive device: Rolling walker Ambulation/Gait Assistance Details: cues for posture, sequence, position from RW and ER on L Gait Pattern: Step-to pattern    Shoulder Instructions     Exercises Total Joint Exercises Ankle Circles/Pumps: AROM;Both;10 reps;Supine Quad Sets: AROM;Both;10 reps;Supine Heel Slides: AAROM;10 reps;Supine;Left Hip ABduction/ADduction: AAROM;Left;10 reps;Supine   PT Diagnosis: Difficulty walking  PT Problem List: Decreased strength;Decreased range of motion;Decreased activity  tolerance;Decreased mobility;Decreased knowledge of use of DME;Pain;Obesity;Decreased knowledge of precautions PT Treatment Interventions: DME instruction;Gait training;Stair training;Functional mobility training;Therapeutic activities;Therapeutic exercise;Patient/family education   PT Goals Acute Rehab PT Goals PT Goal Formulation: With patient Time For Goal Achievement: 12/20/11 Potential to Achieve Goals: Good Pt will go Supine/Side to Sit: with supervision PT Goal: Supine/Side to Sit - Progress: Goal set today Pt will go Sit to Supine/Side: with supervision PT Goal: Sit to Supine/Side - Progress: Goal set today Pt will go Sit to Stand: with supervision PT Goal: Sit to Stand - Progress: Goal set today Pt will go Stand to Sit: with supervision PT Goal: Stand to Sit - Progress: Goal set today Pt will Ambulate: 51 - 150 feet;with supervision;with rolling walker PT Goal: Ambulate - Progress: Goal set today  Visit Information  Last PT Received On: 12/15/11 Assistance Needed: +1    Subjective Data  Subjective: This is my second hip this year Patient Stated Goal: Resume previous lifestyle with decreased pain   Prior Functioning  Home Living Lives With: Alone Available Help at Discharge: Family Type of Home: House Home Access: Ramped entrance Home Layout: One level Home Adaptive Equipment: Walker - rolling Additional Comments: Pt will be staying wtih sister in identical situation as previous THP Prior Function Level of Independence: Independent with assistive device(s) Able to Take Stairs?: Yes Driving: Yes Vocation: Full time employment Communication Communication: No difficulties Dominant Hand: Right    Cognition  Overall Cognitive Status: Appears within functional limits for tasks assessed/performed Arousal/Alertness: Awake/alert Orientation Level: Appears intact for tasks assessed Behavior During Session: [facility] for tasks performed    Extremity/Trunk Assessment Right  Upper Extremity Assessment RUE ROM/Strength/Tone: [facility] for tasks assessed Left Upper Extremity Assessment LUE ROM/Strength/Tone: [facility]  for tasks assessed Right Lower Extremity Assessment RLE ROM/Strength/Tone: [facility] for tasks assessed Left Lower Extremity Assessment LLE ROM/Strength/Tone: Deficits LLE ROM/Strength/Tone Deficits: 2+/5 hip strength with aarom to 75 flex and 20 abd   Balance    End of Session PT - End of Session Activity Tolerance: Patient tolerated treatment well Patient left: in chair;with call bell/phone within reach Nurse Communication: Mobility status  GP     Roni Scow 12/15/2011, 12:57 PM

## 2011-12-16 DIAGNOSIS — E871 Hypo-osmolality and hyponatremia: Secondary | ICD-10-CM | POA: Diagnosis not present

## 2011-12-16 LAB — BASIC METABOLIC PANEL
BUN: 13 mg/dL (ref 6–23)
CO2: 26 mEq/L (ref 19–32)
Calcium: 9.2 mg/dL (ref 8.4–10.5)
Chloride: 100 mEq/L (ref 96–112)
Creatinine, Ser: 0.91 mg/dL (ref 0.50–1.10)
GFR calc Af Amer: 75 mL/min — ABNORMAL LOW (ref >90)
GFR calc non Af Amer: 65 mL/min — ABNORMAL LOW (ref >90)
Glucose, Bld: 124 mg/dL — ABNORMAL HIGH (ref 70–99)
Potassium: 3.7 mEq/L (ref 3.5–5.1)
Sodium: 133 mEq/L — ABNORMAL LOW (ref 135–145)

## 2011-12-16 LAB — CBC
HCT: 28.3 % — ABNORMAL LOW (ref 36.0–46.0)
Hemoglobin: 9.7 g/dL — ABNORMAL LOW (ref 12.0–15.0)
MCH: 30.6 pg (ref 26.0–34.0)
MCHC: 34.3 g/dL (ref 30.0–36.0)
MCV: 89.3 fL (ref 78.0–100.0)
Platelets: 159 10*3/uL (ref 150–400)
RBC: 3.17 MIL/uL — ABNORMAL LOW (ref 3.87–5.11)
RDW: 14 % (ref 11.5–15.5)
WBC: 11.5 10*3/uL — ABNORMAL HIGH (ref 4.0–10.5)

## 2011-12-16 NOTE — Progress Notes (Signed)
Physical Therapy Treatment Patient Details Name: Carla Wright MRN: [id number] DOB: 06/26/1945 Today's Date: 12/16/2011 Time: 1446-1510 PT Time Calculation (min): 24 min  PT Assessment / Plan / Recommendation Comments on Treatment Session       Follow Up Recommendations  Home health PT     Does the patient have the potential to tolerate intense rehabilitation     Barriers to Discharge        Equipment Recommendations  None recommended by PT    Recommendations for Other Services OT consult  Frequency 7X/week   Plan Discharge plan remains appropriate    Precautions / Restrictions Precautions Precautions: Posterior Hip Precaution Comments: Pt recalls all THP Restrictions Weight Bearing Restrictions: No Other Position/Activity Restrictions: WBAT   Pertinent Vitals/Pain Min c/o pain    Mobility  Transfers Transfers: Sit to Stand;Stand to Sit Sit to Stand: 4: Min assist;3: Mod assist Stand to Sit: 4: Min assist Details for Transfer Assistance: cues for use of UEs and for LE management Ambulation/Gait Ambulation/Gait Assistance: 4: Min guard Ambulation Distance (Feet): 147 Feet Assistive device: Rolling walker Ambulation/Gait Assistance Details: cues for ER on L, posture and position from RW Gait Pattern: Step-to pattern    Exercises     PT Diagnosis:    PT Problem List:   PT Treatment Interventions:     PT Goals Acute Rehab PT Goals PT Goal Formulation: With patient Time For Goal Achievement: 12/20/11 Potential to Achieve Goals: Good Pt will go Supine/Side to Sit: with supervision PT Goal: Supine/Side to Sit - Progress: Progressing toward goal Pt will go Sit to Supine/Side: with supervision PT Goal: Sit to Supine/Side - Progress: Progressing toward goal Pt will go Sit to Stand: with supervision PT Goal: Sit to Stand - Progress: Progressing toward goal Pt will go Stand to Sit: with supervision PT Goal: Stand to Sit - Progress: Progressing toward goal Pt will  Ambulate: 51 - 150 feet;with supervision;with rolling walker PT Goal: Ambulate - Progress: Progressing toward goal  Visit Information  Last PT Received On: 12/16/11 Assistance Needed: +1    Subjective Data  Subjective: Now I know I can make it from the car into my sister's house Patient Stated Goal: Resume previous lifestyle with decreased pain   Cognition  Overall Cognitive Status: Appears within functional limits for tasks assessed/performed Arousal/Alertness: Awake/alert Orientation Level: Appears intact for tasks assessed Behavior During Session: [facility] for tasks performed    Balance     End of Session PT - End of Session Activity Tolerance: Patient tolerated treatment well Patient left: in chair;with call bell/phone within reach Nurse Communication: Mobility status   GP     Carla Wright 12/16/2011, 3:43 PM

## 2011-12-16 NOTE — Progress Notes (Signed)
Physical Therapy Treatment Patient Details Name: Carla Wright MRN: [id number] DOB: 1946/02/08 Today's Date: 12/16/2011 Time: 3086-5784 PT Time Calculation (min): 30 min  PT Assessment / Plan / Recommendation Comments on Treatment Session       Follow Up Recommendations  Home health PT     Does the patient have the potential to tolerate intense rehabilitation     Barriers to Discharge        Equipment Recommendations  None recommended by PT    Recommendations for Other Services OT consult  Frequency 7X/week   Plan Discharge plan remains appropriate    Precautions / Restrictions Precautions Precautions: Posterior Hip Precaution Comments: Pt recalls all THP Restrictions Weight Bearing Restrictions: No Other Position/Activity Restrictions: WBAT   Pertinent Vitals/Pain 3/10; ice packs provided    Mobility  Bed Mobility Bed Mobility: Supine to Sit Supine to Sit: 4: Min assist Details for Bed Mobility Assistance: cues for sequence and for use of R LE to self assist; increased time and multimodal cues to shift up and laterally in bed Transfers Transfers: Sit to Stand;Stand to Sit Sit to Stand: 4: Min assist Stand to Sit: 4: Min assist Details for Transfer Assistance: cues for use of UEs and for LE management Ambulation/Gait Ambulation/Gait Assistance: 4: Min assist;4: Min guard Ambulation Distance (Feet): 96 Feet Assistive device: Rolling walker Ambulation/Gait Assistance Details: cues for posture, position from RW and ER on L Gait Pattern: Step-to pattern    Exercises Total Joint Exercises Ankle Circles/Pumps: AROM;Both;Supine;20 reps Quad Sets: AROM;Both;10 reps;Supine Gluteal Sets: AROM;20 reps;Both;Supine Heel Slides: AAROM;Supine;Left;20 reps Hip ABduction/ADduction: AAROM;Left;Supine;20 reps   PT Diagnosis:    PT Problem List:   PT Treatment Interventions:     PT Goals Acute Rehab PT Goals PT Goal Formulation: With patient Time For Goal Achievement:  12/20/11 Potential to Achieve Goals: Good Pt will go Supine/Side to Sit: with supervision PT Goal: Supine/Side to Sit - Progress: Progressing toward goal Pt will go Sit to Supine/Side: with supervision PT Goal: Sit to Supine/Side - Progress: Progressing toward goal Pt will go Sit to Stand: with supervision PT Goal: Sit to Stand - Progress: Progressing toward goal Pt will go Stand to Sit: with supervision PT Goal: Stand to Sit - Progress: Progressing toward goal Pt will Ambulate: 51 - 150 feet;with supervision;with rolling walker PT Goal: Ambulate - Progress: Progressing toward goal  Visit Information  Last PT Received On: 12/16/11 Assistance Needed: +1    Subjective Data  Subjective: I'm ready when you are Patient Stated Goal: Resume previous lifestyle with decreased pain   Cognition  Overall Cognitive Status: Appears within functional limits for tasks assessed/performed Arousal/Alertness: Awake/alert Orientation Level: Appears intact for tasks assessed Behavior During Session: [facility] for tasks performed    Balance     End of Session PT - End of Session Activity Tolerance: Patient tolerated treatment well Patient left: in chair;with call bell/phone within reach Nurse Communication: Mobility status   GP     Rhett Mutschler 12/16/2011, 12:48 PM

## 2011-12-16 NOTE — Progress Notes (Signed)
   Subjective: 2 Days Post-Op Procedure(s) (LRB): TOTAL HIP ARTHROPLASTY (Left) Patient reports pain as mild and moderate.   Patient seen in rounds for Dr. Lequita Halt. Patient is well, but has had some minor complaints of pain in the hip, requiring pain medications Plan is to go Home after hospital stay.  Objective: Vital signs in last 24 hours: Temp:  [98 F (36.7 C)-101.9 F (38.8 C)] 98.1 F (36.7 C) (10/18 0616) Pulse Rate:  [88-99] 88  (10/18 0616) Resp:  [16-18] 16  (10/18 0616) BP: (108-138)/(69-82) 138/82 mmHg (10/18 0616) SpO2:  [96 %-100 %] 98 % (10/18 0616)  Intake/Output from previous day:  Intake/Output Summary (Last 24 hours) at 12/16/11 0752 Last data filed at 12/16/11 0616  Gross per 24 hour  Intake 1218.25 ml  Output   2600 ml  Net -1381.75 ml    Intake/Output this shift:    Labs:  Basename 12/16/11 0340 12/15/11 0343  HGB 9.7* 10.3*    Basename 12/16/11 0340 12/15/11 0343  WBC 11.5* 8.6  RBC 3.17* 3.38*  HCT 28.3* 30.7*  PLT 159 194    Basename 12/16/11 0340 12/15/11 0343  NA 133* 136  K 3.7 3.7  CL 100 101  CO2 26 28  BUN 13 17  CREATININE 0.91 0.87  GLUCOSE 124* 115*  CALCIUM 9.2 9.1   No results found for this basename: LABPT:2,INR:2 in the last 72 hours  EXAM General - Patient is Alert, Appropriate and Oriented Extremity - Neurovascular intact Sensation intact distally Dorsiflexion/Plantar flexion intact No cellulitis present Dressing/Incision - clean, dry, no drainage, healing Motor Function - intact, moving foot and toes well on exam.   Past Medical History  Diagnosis Date  . Obesity   . Allergy   . Diverticulosis   . Thyroid disease   . Actinic keratoses   . Glucose intolerance (impaired glucose tolerance)   . Gout   . Hypertension     EKG and clearance Dr Christena Flake on chart  . Hypothyroidism   . Sleep apnea     doesnt used machine in months-per Dr Susann Givens note- borderline  . Arthritis   . Cancer     basal cell  removed from face    Assessment/Plan: 2 Days Post-Op Procedure(s) (LRB): TOTAL HIP ARTHROPLASTY (Left) Active Problems:  Postop Acute blood loss anemia  Postop Hyponatremia  Estimated Body mass index is 41.44 kg/(m^2) as calculated from the following:   Height as of this encounter: 5\' 5" (1.651 m).   Weight as of this encounter: 249 lb(112.946 kg). Up with therapy Plan for discharge tomorrow Discharge home with home health  DVT Prophylaxis - Xarelto, 81 mg Aspirin on hold for now  Weight Bearing As Tolerated left Leg  PERKINS, ALEXZANDREW 12/16/2011, 7:52 AM

## 2011-12-17 LAB — CBC
HCT: 26.7 % — ABNORMAL LOW (ref 36.0–46.0)
Hemoglobin: 9 g/dL — ABNORMAL LOW (ref 12.0–15.0)
MCH: 30.2 pg (ref 26.0–34.0)
MCHC: 33.7 g/dL (ref 30.0–36.0)
MCV: 89.6 fL (ref 78.0–100.0)
Platelets: 179 10*3/uL (ref 150–400)
RBC: 2.98 MIL/uL — ABNORMAL LOW (ref 3.87–5.11)
RDW: 14.1 % (ref 11.5–15.5)
WBC: 9.2 10*3/uL (ref 4.0–10.5)

## 2011-12-17 LAB — BASIC METABOLIC PANEL
BUN: 17 mg/dL (ref 6–23)
CO2: 26 mEq/L (ref 19–32)
Calcium: 9.1 mg/dL (ref 8.4–10.5)
Chloride: 101 mEq/L (ref 96–112)
Creatinine, Ser: 0.92 mg/dL (ref 0.50–1.10)
GFR calc Af Amer: 74 mL/min — ABNORMAL LOW (ref >90)
GFR calc non Af Amer: 64 mL/min — ABNORMAL LOW (ref >90)
Glucose, Bld: 127 mg/dL — ABNORMAL HIGH (ref 70–99)
Potassium: 3.6 mEq/L (ref 3.5–5.1)
Sodium: 136 mEq/L (ref 135–145)

## 2011-12-17 MED ORDER — OXYCODONE HCL 5 MG PO TABS: 5.0000 mg | ORAL_TABLET | ORAL | Status: DC | PRN

## 2011-12-17 MED ORDER — RIVAROXABAN 10 MG PO TABS: 10.0000 mg | ORAL_TABLET | Freq: Every day | ORAL | Status: DC

## 2011-12-17 NOTE — Progress Notes (Signed)
Physical Therapy Treatment Patient Details Name: Carla Wright MRN: [id number] DOB: 10-07-1945 Today's Date: 12/17/2011 Time: 4098-1191 PT Time Calculation (min): 28 min  PT Assessment / Plan / Recommendation Comments on Treatment Session  Pt doing well with mobility, demonstrates compliance with posterior hip precautions. REady to DC from PT standpoint.     Follow Up Recommendations  Home health PT     Does the patient have the potential to tolerate intense rehabilitation     Barriers to Discharge        Equipment Recommendations  None recommended by PT    Recommendations for Other Services OT consult  Frequency 7X/week   Plan Discharge plan remains appropriate    Precautions / Restrictions Precautions Precautions: Posterior Hip Precaution Comments: Pt recalls 2/3 precautions, reviewed precautions Restrictions Weight Bearing Restrictions: No Other Position/Activity Restrictions: WBAT   Pertinent Vitals/Pain *4/10 L hip with walking Pain meds requested, ice applied**    Mobility  Bed Mobility Bed Mobility: Supine to Sit Supine to Sit: 4: Min assist Details for Bed Mobility Assistance: min A LLE Transfers Transfers: Sit to Stand;Stand to Sit Sit to Stand: 5: Supervision Stand to Sit: 5: Supervision Details for Transfer Assistance: cues for use of UEs and for LE management Ambulation/Gait Ambulation/Gait Assistance: 5: Supervision Ambulation Distance (Feet): 240 Feet Assistive device: Rolling walker Gait Pattern: Step-to pattern    Exercises Total Joint Exercises Ankle Circles/Pumps: AROM;Both;Supine;20 reps Quad Sets: AROM;Both;10 reps;Supine Gluteal Sets: AROM;20 reps;Both;Supine Short Arc Quad: AROM;Left;15 reps;Supine Heel Slides: AAROM;Supine;Left;20 reps Hip ABduction/ADduction: AAROM;Left;Supine;20 reps   PT Diagnosis:    PT Problem List:   PT Treatment Interventions:     PT Goals Acute Rehab PT Goals PT Goal Formulation: With patient Time For  Goal Achievement: 12/20/11 Potential to Achieve Goals: Good Pt will go Supine/Side to Sit: with supervision PT Goal: Supine/Side to Sit - Progress: Progressing toward goal Pt will go Sit to Supine/Side: with supervision Pt will go Sit to Stand: with supervision PT Goal: Sit to Stand - Progress: Met Pt will go Stand to Sit: with supervision PT Goal: Stand to Sit - Progress: Met Pt will Ambulate: 51 - 150 feet;with supervision;with rolling walker PT Goal: Ambulate - Progress: Met  Visit Information  Last PT Received On: 12/17/11 Assistance Needed: +1    Subjective Data  Subjective: I'm ready to leave.  Patient Stated Goal: return to PLOF   Cognition  Overall Cognitive Status: Appears within functional limits for tasks assessed/performed Arousal/Alertness: Awake/alert Orientation Level: Appears intact for tasks assessed Behavior During Session: [facility] for tasks performed    Balance     End of Session PT - End of Session Activity Tolerance: Patient tolerated treatment well Patient left: in chair;with call bell/phone within reach Nurse Communication: Mobility status   GP     Ralene Bathe Kistler 12/17/2011, 8:55 AM [phone]

## 2011-12-17 NOTE — Progress Notes (Signed)
Carla Wright  MRN: [id number] DOB/Age: May 29, 1945 66 y.o. Physician: Lynnea Maizes, M.D. 3 Days Post-Op Procedure(s) (LRB): TOTAL HIP ARTHROPLASTY (Left)  Subjective: Comfortable, good appetite, denies pain, anxious to go home Vital Signs Temp:  [99 F (37.2 C)-100.5 F (38.1 C)] 99.3 F (37.4 C) (10/19 0865) Pulse Rate:  [91-100] 91  (10/19 0613) Resp:  [16] 16  (10/19 0754) BP: (96-113)/(54-70) 96/60 mmHg (10/19 0613) SpO2:  [95 %-99 %] 96 % (10/19 0754)  Lab Results  Basename 12/17/11 0416 12/16/11 0340  WBC 9.2 11.5*  HGB 9.0* 9.7*  HCT 26.7* 28.3*  PLT 179 159   BMET  Basename 12/17/11 0416 12/16/11 0340  NA 136 133*  K 3.6 3.7  CL 101 100  CO2 26 26  GLUCOSE 127* 124*  BUN 17 13  CREATININE 0.92 0.91  CALCIUM 9.1 9.2   INR  Date Value Range Status  12/08/2011 1.04  0.00 - 1.49 Final     Exam  N/V intact LLE, good PT progress  Plan D/C home, HHPT, f/u 2 weeks Kayelynn Abdou M 12/17/2011, 9:09 AM

## 2011-12-17 NOTE — Progress Notes (Signed)
Pt stable, scripts, and d/c instructions given with no questions/concerns voiced.  Pt transported via wheelchair to private vehicle with NT and sister.

## 2011-12-22 NOTE — Discharge Summary (Signed)
Physician Discharge Summary   Patient ID: Carla Wright MRN: [id number] DOB/AGE: 65-Jan-1947 65 y.o.  Admit date: 12/14/2011 Discharge date: 12/17/11  Primary Diagnosis: Osteoarthritis Left hip   Admission Diagnoses:  Past Medical History  Diagnosis Date  . Obesity   . Allergy   . Diverticulosis   . Thyroid disease   . Actinic keratoses   . Glucose intolerance (impaired glucose tolerance)   . Gout   . Hypertension     EKG and clearance Dr Christena Flake on chart  . Hypothyroidism   . Sleep apnea     doesnt used machine in months-per Dr Susann Givens note- borderline  . Arthritis   . Cancer     basal cell removed from face   Discharge Diagnoses:   Active Problems:  Postop Acute blood loss anemia  Postop Hyponatremia  Estimated Body mass index is 41.44 kg/(m^2) as calculated from the following:   Height as of this encounter: 5\' 5" (1.651 m).   Weight as of this encounter: 249 lb(112.946 kg).  Classification of overweight in adults according to BMI (WHO, 1998)   Procedure: Procedure(s) (LRB): TOTAL HIP ARTHROPLASTY (Left)   Consults: None  HPI: Carla Wright is a 66 y.o. female with end stage arthritis of her left hip with progressively worsening pain and dysfunction. Pain occurs with activity and rest including pain at night. She has tried analgesics, protected weight bearing and rest without benefit. Pain is too severe to attempt physical therapy. Radiographs demonstrate bone on bone arthritis with subchondral cyst formation. She presents now for left THA.  Laboratory Data: Admission on 12/14/2011, Discharged on 12/17/2011  Component Date Value Range Status  . WBC 12/15/2011 8.6  4.0 - 10.5 K/uL Final  . RBC 12/15/2011 3.38* 3.87 - 5.11 MIL/uL Final  . Hemoglobin 12/15/2011 10.3* 12.0 - 15.0 g/dL Final  . HCT 40/98/1191 30.7* 36.0 - 46.0 % Final  . MCV 12/15/2011 90.8  78.0 - 100.0 fL Final  . MCH 12/15/2011 30.5  26.0 - 34.0 pg Final  . MCHC 12/15/2011 33.6  30.0 - 36.0 g/dL  Final  . RDW 47/82/9562 13.9  11.5 - 15.5 % Final  . Platelets 12/15/2011 194  150 - 400 K/uL Final  . Sodium 12/15/2011 136  135 - 145 mEq/L Final  . Potassium 12/15/2011 3.7  3.5 - 5.1 mEq/L Final  . Chloride 12/15/2011 101  96 - 112 mEq/L Final  . CO2 12/15/2011 28  19 - 32 mEq/L Final  . Glucose, Bld 12/15/2011 115* 70 - 99 mg/dL Final  . BUN 13/09/6576 17  6 - 23 mg/dL Final  . Creatinine, Ser 12/15/2011 0.87  0.50 - 1.10 mg/dL Final  . Calcium 46/96/2952 9.1  8.4 - 10.5 mg/dL Final  . GFR calc non Af Amer 12/15/2011 68* >90 mL/min Final  . GFR calc Af Amer 12/15/2011 79* >90 mL/min Final   Comment:                                 The eGFR has been calculated                          using the CKD EPI equation.                          This calculation has not been  validated in all clinical                          situations.                          eGFR's persistently                          <90 mL/min signify                          possible Chronic Kidney Disease.  . WBC 12/16/2011 11.5* 4.0 - 10.5 K/uL Final  . RBC 12/16/2011 3.17* 3.87 - 5.11 MIL/uL Final  . Hemoglobin 12/16/2011 9.7* 12.0 - 15.0 g/dL Final  . HCT 96/29/5284 28.3* 36.0 - 46.0 % Final  . MCV 12/16/2011 89.3  78.0 - 100.0 fL Final  . MCH 12/16/2011 30.6  26.0 - 34.0 pg Final  . MCHC 12/16/2011 34.3  30.0 - 36.0 g/dL Final  . RDW 13/24/4010 14.0  11.5 - 15.5 % Final  . Platelets 12/16/2011 159  150 - 400 K/uL Final  . Sodium 12/16/2011 133* 135 - 145 mEq/L Final  . Potassium 12/16/2011 3.7  3.5 - 5.1 mEq/L Final  . Chloride 12/16/2011 100  96 - 112 mEq/L Final  . CO2 12/16/2011 26  19 - 32 mEq/L Final  . Glucose, Bld 12/16/2011 124* 70 - 99 mg/dL Final  . BUN 27/25/3664 13  6 - 23 mg/dL Final  . Creatinine, Ser 12/16/2011 0.91  0.50 - 1.10 mg/dL Final  . Calcium 40/34/7425 9.2  8.4 - 10.5 mg/dL Final  . GFR calc non Af Amer 12/16/2011 65* >90 mL/min Final  . GFR calc Af Amer  12/16/2011 75* >90 mL/min Final   Comment:                                 The eGFR has been calculated                          using the CKD EPI equation.                          This calculation has not been                          validated in all clinical                          situations.                          eGFR's persistently                          <90 mL/min signify                          possible Chronic Kidney Disease.  . WBC 12/17/2011 9.2  4.0 - 10.5 K/uL Final  . RBC 12/17/2011 2.98* 3.87 - 5.11 MIL/uL Final  . Hemoglobin 12/17/2011 9.0* 12.0 - 15.0 g/dL Final  . HCT 95/63/8756 26.7* 36.0 - 46.0 %  Final  . MCV 12/17/2011 89.6  78.0 - 100.0 fL Final  . MCH 12/17/2011 30.2  26.0 - 34.0 pg Final  . MCHC 12/17/2011 33.7  30.0 - 36.0 g/dL Final  . RDW 16/11/9602 14.1  11.5 - 15.5 % Final  . Platelets 12/17/2011 179  150 - 400 K/uL Final  . Sodium 12/17/2011 136  135 - 145 mEq/L Final  . Potassium 12/17/2011 3.6  3.5 - 5.1 mEq/L Final  . Chloride 12/17/2011 101  96 - 112 mEq/L Final  . CO2 12/17/2011 26  19 - 32 mEq/L Final  . Glucose, Bld 12/17/2011 127* 70 - 99 mg/dL Final  . BUN 54/10/8117 17  6 - 23 mg/dL Final  . Creatinine, Ser 12/17/2011 0.92  0.50 - 1.10 mg/dL Final  . Calcium 14/78/2956 9.1  8.4 - 10.5 mg/dL Final  . GFR calc non Af Amer 12/17/2011 64* >90 mL/min Final  . GFR calc Af Amer 12/17/2011 74* >90 mL/min Final   Comment:                                 The eGFR has been calculated                          using the CKD EPI equation.                          This calculation has not been                          validated in all clinical                          situations.                          eGFR's persistently                          <90 mL/min signify                          possible Chronic Kidney Disease.  Hospital Outpatient Visit on 12/08/2011  Component Date Value Range Status  . aPTT 12/08/2011 32  24 - 37 seconds Final    . WBC 12/08/2011 7.6  4.0 - 10.5 K/uL Final  . RBC 12/08/2011 4.38  3.87 - 5.11 MIL/uL Final  . Hemoglobin 12/08/2011 12.9  12.0 - 15.0 g/dL Final  . HCT 21/30/8657 39.6  36.0 - 46.0 % Final  . MCV 12/08/2011 90.4  78.0 - 100.0 fL Final  . MCH 12/08/2011 29.5  26.0 - 34.0 pg Final  . MCHC 12/08/2011 32.6  30.0 - 36.0 g/dL Final  . RDW 84/69/6295 13.9  11.5 - 15.5 % Final  . Platelets 12/08/2011 289  150 - 400 K/uL Final  . Sodium 12/08/2011 135  135 - 145 mEq/L Final  . Potassium 12/08/2011 3.8  3.5 - 5.1 mEq/L Final  . Chloride 12/08/2011 97  96 - 112 mEq/L Final  . CO2 12/08/2011 29  19 - 32 mEq/L Final  . Glucose, Bld 12/08/2011 85  70 - 99 mg/dL Final  . BUN 28/41/3244 24* 6 - 23 mg/dL Final  . Creatinine,  Ser 12/08/2011 0.87  0.50 - 1.10 mg/dL Final  . Calcium 19/14/7829 10.3  8.4 - 10.5 mg/dL Final  . Total Protein 12/08/2011 7.4  6.0 - 8.3 g/dL Final  . Albumin 56/21/3086 3.7  3.5 - 5.2 g/dL Final  . AST 57/84/6962 14  0 - 37 U/L Final  . ALT 12/08/2011 10  0 - 35 U/L Final  . Alkaline Phosphatase 12/08/2011 93  39 - 117 U/L Final  . Total Bilirubin 12/08/2011 0.3  0.3 - 1.2 mg/dL Final  . GFR calc non Af Amer 12/08/2011 68* >90 mL/min Final  . GFR calc Af Amer 12/08/2011 79* >90 mL/min Final   Comment:                                 The eGFR has been calculated                          using the CKD EPI equation.                          This calculation has not been                          validated in all clinical                          situations.                          eGFR's persistently                          <90 mL/min signify                          possible Chronic Kidney Disease.  Marland Kitchen Prothrombin Time 12/08/2011 13.5  11.6 - 15.2 seconds Final  . INR 12/08/2011 1.04  0.00 - 1.49 Final  . Color, Urine 12/08/2011 YELLOW  YELLOW Final  . APPearance 12/08/2011 CLOUDY* CLEAR Final  . Specific Gravity, Urine 12/08/2011 1.009  1.005 - 1.030 Final  . pH  12/08/2011 6.0  5.0 - 8.0 Final  . Glucose, UA 12/08/2011 NEGATIVE  NEGATIVE mg/dL Final  . Hgb urine dipstick 12/08/2011 NEGATIVE  NEGATIVE Final  . Bilirubin Urine 12/08/2011 NEGATIVE  NEGATIVE Final  . Ketones, ur 12/08/2011 NEGATIVE  NEGATIVE mg/dL Final  . Protein, ur 95/28/4132 NEGATIVE  NEGATIVE mg/dL Final  . Urobilinogen, UA 12/08/2011 0.2  0.0 - 1.0 mg/dL Final  . Nitrite 44/02/270 POSITIVE* NEGATIVE Final  . Leukocytes, UA 12/08/2011 MODERATE* NEGATIVE Final  . MRSA, PCR 12/08/2011 NEGATIVE  NEGATIVE Final  . Staphylococcus aureus 12/08/2011 NEGATIVE  NEGATIVE Final   Comment:                                 The Xpert SA Assay (FDA                          approved for NASAL specimens  in patients over 24 years of age),                          is one component of                          a comprehensive surveillance                          program.  Test performance has                          been validated by [employer] for patients greater                          than or equal to 76 year old.                          It is not intended                          to diagnose infection nor to                          guide or monitor treatment.  . ABO/RH(D) 12/08/2011 O POS   Final  . Antibody Screen 12/08/2011 NEG   Final  . Sample Expiration 12/08/2011 12/17/2011   Final  . Squamous Epithelial / LPF 12/08/2011 RARE  RARE Final  . WBC, UA 12/08/2011 7-10  <3 WBC/hpf Final  . Bacteria, UA 12/08/2011 MANY* RARE Final     X-Rays:Dg Hip Complete Left  12/08/2011  *RADIOLOGY REPORT*  Clinical Data: Chronic hip pain  LEFT HIP - COMPLETE 2+ VIEW  Comparison: January 09, 2008  Findings: Frontal pelvis as well as frontal and lateral left hip images were obtained.  There is a total hip prosthesis on the right which is well seated.  There is advanced osteoarthritis in the left hip joint with essentially complete loss of disc  space and extensive remodelling of the femoral head.  There is probable underlying avascular necrosis in the left femoral head.  There has been progression of disease in this area compared to prior study.  There is no acute fracture or dislocation.  IMPRESSION: Advanced osteoarthritis in the left hip joint as described. There has been progression of disease since prior study.  There may be underlying avascular necrosis in the left femoral head.  No acute fracture or dislocation.  Status post total hip replacement on the right with prosthetic components appearing well seated.   Original Report Authenticated By: Arvin Collard. Margarita Grizzle III, M.D.    Dg Pelvis Portable  12/14/2011  *RADIOLOGY REPORT*  Clinical Data: Left hip arthroplasty  PORTABLE PELVIS  Comparison: 12/08/2011  Findings: Left hip replacement in satisfactory position alignment. No fracture or immediate complication.  IMPRESSION: Satisfactory left hip replacement.   Original Report Authenticated By: Camelia Phenes, M.D.    Dg Hip Portable 1 View Left  12/14/2011  *RADIOLOGY REPORT*  Clinical Data: Left hip arthroplasty  PORTABLE LEFT HIP - 1 VIEW  Comparison: 12/08/2011  Findings: Left hip arthroplasty in  satisfactory position alignment. No fracture or immediate complication.  IMPRESSION: Satisfactory left hip arthroplasty.   Original Report Authenticated By: Camelia Phenes, M.D.     EKG:No orders found for this or any previous visit.   Hospital Course:  Patient was admitted to [facility] and taken to the OR and underwent the above state procedure without complications.  Patient tolerated the procedure well and was later transferred to the recovery room and then to the orthopaedic floor for postoperative care.  They were given PO and IV analgesics for pain control following their surgery.  They were given 24 hours of postoperative antibiotics of  Anti-infectives     Start     Dose/Rate Route Frequency Ordered Stop   12/14/11 1630    ceFAZolin (ANCEF) IVPB 2 g/50 mL premix        2 g 100 mL/hr over 30 Minutes Intravenous Every 6 hours 12/14/11 1449 12/14/11 2240   12/14/11 0815   ceFAZolin (ANCEF) IVPB 2 g/50 mL premix        2 g 100 mL/hr over 30 Minutes Intravenous 60 min pre-op 12/14/11 0815 12/14/11 1037         and started on DVT prophylaxis in the form of Xarelto.   PT and OT were ordered for total hip protocol.  The patient was allowed to be WBAT with therapy. Discharge planning was consulted to help with postop disposition and equipment needs.  Patient had a good night on the evening of surgery and started to get up OOB with therapy on day one and walked over 30 feet.  Hemovac drain was pulled without difficulty.  The knee immobilizer was removed and discontinued.  Continued to work with therapy into day two and walked over 100 feet.  Dressing was changed on day two and the incision was healing well.  By day three, the patient had progressed with therapy and meeting their goals.  Incision was healing well.  Patient was seen in rounds and was ready to go home.  Discharge Medications: Prior to Admission medications   Medication Sig Start Date End Date Taking? Authorizing Provider  acetaminophen (TYLENOL ARTHRITIS PAIN) 650 MG CR tablet Take 650 mg by mouth every 8 (eight) hours as needed. Pain   Yes Historical Provider, MD  allopurinol (ZYLOPRIM) 300 MG tablet Take 300 mg by mouth daily before breakfast.   Yes Historical Provider, MD  levothyroxine (SYNTHROID, LEVOTHROID) 88 MCG tablet Take 88 mcg by mouth daily before breakfast.   Yes Historical Provider, MD  lisinopril-hydrochlorothiazide (PRINZIDE,ZESTORETIC) 20-12.5 MG per tablet Take 1 tablet by mouth daily before breakfast.   Yes Historical Provider, MD  traMADol-acetaminophen (ULTRACET) 37.5-325 MG per tablet Take 2 tablets by mouth every 6 (six) hours as needed. pain   Yes Historical Provider, MD  Multiple Vitamin (MULTIVITAMIN WITH MINERALS) TABS Take 1  tablet by mouth daily.    Historical Provider, MD  oxyCODONE (OXY IR/ROXICODONE) 5 MG immediate release tablet Take 1-2 tablets (5-10 mg total) by mouth every 4 (four) hours as needed. 12/17/11   Senaida Lange, MD  rivaroxaban (XARELTO) 10 MG TABS tablet Take 1 tablet (10 mg total) by mouth daily with breakfast. 12/17/11   Senaida Lange, MD    Diet: Cardiac diet Activity:WBAT No bending hip over 90 degrees- A "L" Angle Do not cross legs Do not let foot roll inward When turning these patients a pillow should be placed between the patient's legs to prevent crossing. Patients should have the  affected knee fully extended when trying to sit or stand from all surfaces to prevent excessive hip flexion. When ambulating and turning toward the affected side the affected leg should have the toes turned out prior to moving the walker and the rest of patient's body as to prevent internal rotation/ turning in of the leg. Abduction pillows are the most effective way to prevent a patient from not crossing legs or turning toes in at rest. If an abduction pillow is not ordered placing a regular pillow length wise between the patient's legs is also an effective reminder. It is imperative that these precautions be maintained so that the surgical hip does not dislocate. Follow-up:in 2 weeks Disposition - Home Discharged Condition: good      Medication List     As of 12/22/2011  9:35 AM    STOP taking these medications         aspirin EC 81 MG tablet      TAKE these medications         allopurinol 300 MG tablet   Commonly known as: ZYLOPRIM   Take 300 mg by mouth daily before breakfast.      levothyroxine 88 MCG tablet   Commonly known as: SYNTHROID, LEVOTHROID   Take 88 mcg by mouth daily before breakfast.      lisinopril-hydrochlorothiazide 20-12.5 MG per tablet   Commonly known as: PRINZIDE,ZESTORETIC   Take 1 tablet by mouth daily before breakfast.      multivitamin with minerals Tabs    Take 1 tablet by mouth daily.      oxyCODONE 5 MG immediate release tablet   Commonly known as: Oxy IR/ROXICODONE   Take 1-2 tablets (5-10 mg total) by mouth every 4 (four) hours as needed.      rivaroxaban 10 MG Tabs tablet   Commonly known as: XARELTO   Take 1 tablet (10 mg total) by mouth daily with breakfast.      traMADol-acetaminophen 37.5-325 MG per tablet   Commonly known as: ULTRACET   Take 2 tablets by mouth every 6 (six) hours as needed. pain      TYLENOL ARTHRITIS PAIN 650 MG CR tablet   Generic drug: acetaminophen   Take 650 mg by mouth every 8 (eight) hours as needed. Pain         Signed: Patrica Duel 12/22/2011, 9:35 AM

## 2012-04-05 LAB — HM MAMMOGRAPHY

## 2012-04-06 ENCOUNTER — Encounter: Payer: Self-pay | Admitting: Internal Medicine

## 2012-04-17 ENCOUNTER — Encounter: Payer: Self-pay | Admitting: Family Medicine

## 2012-04-17 LAB — HM MAMMOGRAPHY: HM Mammogram: NEGATIVE

## 2012-05-15 ENCOUNTER — Other Ambulatory Visit: Payer: Self-pay | Admitting: Family Medicine

## 2012-05-27 ENCOUNTER — Other Ambulatory Visit: Payer: Self-pay | Admitting: Family Medicine

## 2012-10-03 ENCOUNTER — Other Ambulatory Visit: Payer: Self-pay

## 2012-10-03 DIAGNOSIS — L570 Actinic keratosis: Secondary | ICD-10-CM | POA: Diagnosis not present

## 2012-10-18 DIAGNOSIS — L570 Actinic keratosis: Secondary | ICD-10-CM | POA: Diagnosis not present

## 2012-12-17 DIAGNOSIS — L570 Actinic keratosis: Secondary | ICD-10-CM | POA: Diagnosis not present

## 2012-12-24 ENCOUNTER — Telehealth: Payer: Self-pay | Admitting: Family Medicine

## 2012-12-24 ENCOUNTER — Other Ambulatory Visit: Payer: Self-pay

## 2012-12-24 MED ORDER — LISINOPRIL-HYDROCHLOROTHIAZIDE 20-12.5 MG PO TABS: ORAL_TABLET | ORAL | Status: DC

## 2012-12-24 NOTE — Telephone Encounter (Signed)
Pt called and mail cpe for 01/23/13.  She needs refill to [facility]  [phone] for Lisinopril.

## 2012-12-24 NOTE — Telephone Encounter (Signed)
DONE

## 2012-12-24 NOTE — Telephone Encounter (Signed)
SENT IN B/P MED  

## 2013-01-03 ENCOUNTER — Other Ambulatory Visit: Payer: Self-pay

## 2013-01-23 ENCOUNTER — Encounter: Payer: Self-pay | Admitting: Family Medicine

## 2013-01-23 ENCOUNTER — Other Ambulatory Visit: Payer: Self-pay | Admitting: Family Medicine

## 2013-01-23 ENCOUNTER — Ambulatory Visit (INDEPENDENT_AMBULATORY_CARE_PROVIDER_SITE_OTHER): Payer: Medicare Other | Admitting: Family Medicine

## 2013-01-23 VITALS — BP 134/80 | HR 98 | Ht 65.0 in | Wt 296.0 lb

## 2013-01-23 DIAGNOSIS — M109 Gout, unspecified: Secondary | ICD-10-CM | POA: Diagnosis not present

## 2013-01-23 DIAGNOSIS — Z79899 Other long term (current) drug therapy: Secondary | ICD-10-CM

## 2013-01-23 DIAGNOSIS — K5730 Diverticulosis of large intestine without perforation or abscess without bleeding: Secondary | ICD-10-CM

## 2013-01-23 DIAGNOSIS — G4733 Obstructive sleep apnea (adult) (pediatric): Secondary | ICD-10-CM | POA: Diagnosis not present

## 2013-01-23 DIAGNOSIS — I10 Essential (primary) hypertension: Secondary | ICD-10-CM

## 2013-01-23 DIAGNOSIS — R7309 Other abnormal glucose: Secondary | ICD-10-CM | POA: Diagnosis not present

## 2013-01-23 DIAGNOSIS — R319 Hematuria, unspecified: Secondary | ICD-10-CM

## 2013-01-23 DIAGNOSIS — E039 Hypothyroidism, unspecified: Secondary | ICD-10-CM | POA: Diagnosis not present

## 2013-01-23 DIAGNOSIS — R7302 Impaired glucose tolerance (oral): Secondary | ICD-10-CM

## 2013-01-23 DIAGNOSIS — Z23 Encounter for immunization: Secondary | ICD-10-CM | POA: Diagnosis not present

## 2013-01-23 DIAGNOSIS — L570 Actinic keratosis: Secondary | ICD-10-CM

## 2013-01-23 DIAGNOSIS — Z96649 Presence of unspecified artificial hip joint: Secondary | ICD-10-CM | POA: Insufficient documentation

## 2013-01-23 DIAGNOSIS — K5790 Diverticulosis of intestine, part unspecified, without perforation or abscess without bleeding: Secondary | ICD-10-CM

## 2013-01-23 LAB — COMPREHENSIVE METABOLIC PANEL
ALT: 16 U/L (ref 0–35)
AST: 15 U/L (ref 0–37)
Albumin: 4.3 g/dL (ref 3.5–5.2)
Alkaline Phosphatase: 72 U/L (ref 39–117)
BUN: 22 mg/dL (ref 6–23)
CO2: 24 mEq/L (ref 19–32)
Calcium: 10.1 mg/dL (ref 8.4–10.5)
Chloride: 101 mEq/L (ref 96–112)
Creat: 0.81 mg/dL (ref 0.50–1.10)
Glucose, Bld: 113 mg/dL — ABNORMAL HIGH (ref 70–99)
Potassium: 3.8 mEq/L (ref 3.5–5.3)
Sodium: 136 mEq/L (ref 135–145)
Total Bilirubin: 0.6 mg/dL (ref 0.3–1.2)
Total Protein: 7.3 g/dL (ref 6.0–8.3)

## 2013-01-23 LAB — CBC WITH DIFFERENTIAL/PLATELET
Basophils Absolute: 0 10*3/uL (ref 0.0–0.1)
Basophils Relative: 0 % (ref 0–1)
Eosinophils Absolute: 0.3 10*3/uL (ref 0.0–0.7)
Eosinophils Relative: 3 % (ref 0–5)
HCT: 41.1 % (ref 36.0–46.0)
Hemoglobin: 14.3 g/dL (ref 12.0–15.0)
Lymphocytes Relative: 22 % (ref 12–46)
Lymphs Abs: 2 10*3/uL (ref 0.7–4.0)
MCH: 31.3 pg (ref 26.0–34.0)
MCHC: 34.8 g/dL (ref 30.0–36.0)
MCV: 89.9 fL (ref 78.0–100.0)
Monocytes Absolute: 0.6 10*3/uL (ref 0.1–1.0)
Monocytes Relative: 6 % (ref 3–12)
Neutro Abs: 6.5 10*3/uL (ref 1.7–7.7)
Neutrophils Relative %: 69 % (ref 43–77)
Platelets: 228 10*3/uL (ref 150–400)
RBC: 4.57 MIL/uL (ref 3.87–5.11)
RDW: 14.9 % (ref 11.5–15.5)
WBC: 9.4 10*3/uL (ref 4.0–10.5)

## 2013-01-23 LAB — POCT URINALYSIS DIPSTICK
Bilirubin, UA: NEGATIVE
Glucose, UA: NEGATIVE
Ketones, UA: NEGATIVE
Nitrite, UA: NEGATIVE
Spec Grav, UA: 1.02
Urobilinogen, UA: NEGATIVE
pH, UA: 5

## 2013-01-23 LAB — URIC ACID: Uric Acid, Serum: 5.6 mg/dL (ref 2.4–7.0)

## 2013-01-23 NOTE — Progress Notes (Signed)
Teaching Physician: [name], MD Dictated By: Judithann Graves  Subjective:  Carla Wright is a 67 y.o. female who presents for her annual complete exam. She has no acute issues that she would like to address today.  She has a history of hypertension and she does not check her blood pressures at home frequently although when she does they tend to be 120s-130/80. At present she is not using her CPAP for her OSA, stating that because she now sleeps in her chair she no longer has any episodes of PND. She has no dyspnea when she is supine vs. inclined, rather, she only sleeps in this position because 'it feels better on her joints.' She has a remote history of diverticulosis and she has had no blood in her stools or constipation. Her hypothyroidism has been managed with Synthroid and she has had no change in appetite or bowel habits and no cold intolerance. She saw her dermatologist in August for her actinic keratoses and she also had a BCC removed from her nose. Since her last visit here she has had her second total hip replacement for her osteoarthritis and she is doing well with no pain or functional impairment. She concedes that she has probably gained some weight since retiring and no longer attending [employer] at work. Her physical activities are quite limited  She has a history of gout and this is under good control with her Allopurinol. She also has a history of glucose intolerance but at present she has no polyuria, polydipsia, change in vision.  Family and social histories were reviewed.   ROS negative except as in subjective.  Objective: Filed Vitals:   01/23/13 1052  BP: 134/80  Pulse: 98    Physical Exam:  BP 134/80  Pulse 98  Ht 5\' 5"  (1.651 m)  Wt 296 lb (134.265 kg)  BMI 49.26 kg/m2  SpO2 98%  General Appearance:    Alert, cooperative, no distress, appears stated age  Head:    Normocephalic, without obvious abnormality, atraumatic  Eyes:    PERRL, conjunctiva/corneas  clear, EOM's intact, fundi    benign  Ears:    Normal TM's and external ear canals  Nose:   Nares normal, mucosa normal, no drainage or sinus   tenderness  Throat:   Lips, mucosa, and tongue normal; teeth and gums normal  Neck:   Supple, no lymphadenopathy;  thyroid:  no   enlargement/tenderness/nodules; no carotid   bruit or JVD  Back:    Spine nontender, no curvature, ROM normal, no CVA     tenderness  Lungs:     Clear to auscultation bilaterally without wheezes, rales or     ronchi; respirations unlabored  Chest Wall:    No tenderness or deformity   Heart:    Regular rate and rhythm, S1 and S2 normal, no murmur, rub   or gallop  Breast Exam:    No tenderness, masses, or nipple discharge or inversion.      No axillary lymphadenopathy  Abdomen:     Soft, non-tender, nondistended, normoactive bowel sounds,    no masses, no hepatosplenomegaly        Extremities:   No clubbing, cyanosis or edema  Pulses:   2+ and symmetric all extremities  Skin:   Skin color, texture, turgor normal, no rashes or lesions  Lymph nodes:   Cervical, supraclavicular, and axillary nodes normal  Neurologic:   CNII-XII intact, normal strength, sensation and gait; reflexes 2+ and symmetric throughout  Psych:   Normal mood, affect, hygiene and grooming.    Assessment and Plan: 1. Hypertension Will continue her Lisinopril-HCTZ 20-12.5 mg tablet. - POCT Urinalysis Dipstick - CBC with Differential - Comprehensive metabolic panel  2. Need for prophylactic vaccination and inoculation against influenza - Flu vaccine HIGH DOSE PF (Fluzone Tri High dose)  3. Blood in urine Likely benign finding. No RBCs  and epithelial cell seen by microscopy. No dysuria or increased frequency, no suprapubic tenderness on exam.  - POCT Urinalysis Dipstick  4. Sleep apnea, obstructive Will try to find mask for CPAP that fits over her mouth and nose as she feels that this will be more comfortable.  5.  Hypothyroid Continuing on her Levothyroxine 88 mcg tablet. - Will check TSH today   6. Glucose intolerance (impaired glucose tolerance) Not symptomatic with no polyuria, polydipsia, no change in vision. - Comprehensive metabolic panel  7. Diverticulosis Not symptomatic with no bloody stools and no constipation.  8. Actinic keratoses Will continue to monitor for malignant transformation.   9. S/P total hip arthroplasty Tolerating her usual activities well with no functional impairment.  10. Gout Well managed with her Allopurinol with no recent gouty flares. - Will check Uric Acid level today  11. Encounter for long-term (current) use of other medications - CBC with Differential - Comprehensive metabolic panel - Uric Acid - TSH Discussed increasing her physical activity by joining [employer] sne[employer]. Also recommended getting involved in Weight [employer]. She does need the medication of being involved in his current program to get the full benefit. She does recognize the need to have someone monitoring her condition. I explained that this is a lifestyle issue. Also encouraged her to take walks with her dog. Over 45 minutes spent discussing all these issues with her.  Dr. Susann Givens was present for the encounter and agrees with the above assessment and plan.

## 2013-01-24 LAB — TSH: TSH: 5.092 u[IU]/mL — ABNORMAL HIGH (ref 0.350–4.500)

## 2013-01-28 ENCOUNTER — Telehealth: Payer: Self-pay | Admitting: Family Medicine

## 2013-01-28 LAB — HEMOGLOBIN A1C
Hgb A1c MFr Bld: 6.6 % — ABNORMAL HIGH (ref <5.7)
Mean Plasma Glucose: 143 mg/dL — ABNORMAL HIGH (ref <117)

## 2013-01-28 MED ORDER — ALLOPURINOL 300 MG PO TABS: ORAL_TABLET | ORAL | Status: DC

## 2013-01-28 NOTE — Telephone Encounter (Signed)
DR.LALONDE I DO NOT SEE ANYTHING IN NOTES ABOUT THIS PLEASE ADVISE

## 2013-01-28 NOTE — Telephone Encounter (Signed)
Pt states that when she was here on the 26 she was told something would be called in for a uti and as of today that has not been done. She also states that Allopurinal was to be refilled as well. Please call pt and inform when done. [phone]

## 2013-01-28 NOTE — Telephone Encounter (Signed)
LEFT WORD FOR WORD MESSAGE ON PT #

## 2013-01-28 NOTE — Telephone Encounter (Signed)
I called in the allopurinol however her microscopic on the urine was negative so no antibiotic needed

## 2013-02-15 ENCOUNTER — Telehealth: Payer: Self-pay | Admitting: Family Medicine

## 2013-02-15 MED ORDER — LEVOTHYROXINE SODIUM 88 MCG PO TABS: ORAL_TABLET | ORAL | Status: DC

## 2013-02-15 NOTE — Telephone Encounter (Signed)
Synthroid renewed 

## 2013-03-07 ENCOUNTER — Ambulatory Visit (INDEPENDENT_AMBULATORY_CARE_PROVIDER_SITE_OTHER): Payer: Medicare Other | Admitting: Family Medicine

## 2013-03-07 ENCOUNTER — Encounter: Payer: Self-pay | Admitting: Family Medicine

## 2013-03-07 VITALS — BP 130/82 | HR 108 | Wt 287.0 lb

## 2013-03-07 DIAGNOSIS — E119 Type 2 diabetes mellitus without complications: Secondary | ICD-10-CM | POA: Diagnosis not present

## 2013-03-07 DIAGNOSIS — E785 Hyperlipidemia, unspecified: Secondary | ICD-10-CM | POA: Diagnosis not present

## 2013-03-07 DIAGNOSIS — E1169 Type 2 diabetes mellitus with other specified complication: Secondary | ICD-10-CM | POA: Insufficient documentation

## 2013-03-07 LAB — LIPID PANEL
Cholesterol: 178 mg/dL (ref 0–200)
HDL: 47 mg/dL (ref >39)
LDL Cholesterol: 101 mg/dL — ABNORMAL HIGH (ref 0–99)
Total CHOL/HDL Ratio: 3.8 Ratio
Triglycerides: 148 mg/dL (ref <150)
VLDL: 30 mg/dL (ref 0–40)

## 2013-03-07 NOTE — Patient Instructions (Signed)
Go to the American diabetes association website or [email or web address]

## 2013-03-07 NOTE — Progress Notes (Signed)
   Subjective:    Patient ID: Carla Wright, female    DOB: [birth year], 67 y.o.   MRN: [id number]  HPI She is here for a recheck. Recent blood work did show elevated blood sugar and a subsequent hemoglobin A1c was reported at 6.6. Prior to this visit she has made some dietary and exercise changes and has lost several pounds.   Review of Systems     Objective:   Physical Exam Alert and in no distress otherwise not examined.      Assessment & Plan:  Diabetes mellitus, new onset - Plan: Lipid panel  over 20 minutes spent discussing the diagnosis of diabetes in regard to diet, exercise, indications, checking blood sugars. Since she's start to lose weight I encouraged her to continue with this. I will check her lipid panel and possibly place her on a statin drug. Otherwise I will recheck her in 2 months. Encouraged her to check various websites to get more information on diabetes. She will also be referred to diabetes education.

## 2013-03-08 MED ORDER — ATORVASTATIN CALCIUM 10 MG PO TABS: 10.0000 mg | ORAL_TABLET | Freq: Every day | ORAL | Status: DC

## 2013-03-08 NOTE — Addendum Note (Signed)
Addended by: Denita Lung on: 03/08/2013 09:46 AM   Modules accepted: Orders

## 2013-03-20 ENCOUNTER — Other Ambulatory Visit: Payer: Self-pay | Admitting: Family Medicine

## 2013-03-22 ENCOUNTER — Encounter: Payer: Medicare Other | Attending: Family Medicine

## 2013-03-22 VITALS — Ht 65.0 in | Wt 293.1 lb

## 2013-03-22 DIAGNOSIS — Z713 Dietary counseling and surveillance: Secondary | ICD-10-CM | POA: Diagnosis not present

## 2013-03-22 DIAGNOSIS — E119 Type 2 diabetes mellitus without complications: Secondary | ICD-10-CM | POA: Diagnosis not present

## 2013-03-27 NOTE — Progress Notes (Signed)
Patient was seen on 03/22/13 for the first of a series of three diabetes self-management courses at the [facility].  Current HbA1c: 6.6%  The following learning objectives were met by the patient during this class:  Describe diabetes  State some common risk factors for diabetes  Defines the role of glucose and insulin  Identifies type of diabetes and pathophysiology  Describe the relationship between diabetes and cardiovascular risk  State the members of the Healthcare Team  States the rationale for glucose monitoring  State when to test glucose  State their individual Target Range  State the importance of logging glucose readings  Describe how to interpret glucose readings  Identifies A1C target  Explain the correlation between A1c and eAG values  State symptoms and treatment of high blood glucose  State symptoms and treatment of low blood glucose  Explain proper technique for glucose testing  Identifies proper sharps disposal  Handouts given during class include:  Living Well with Diabetes book  Carb Counting and Meal Planning book  Meal Plan Card  Carbohydrate guide  Meal planning worksheet  Low Sodium Flavoring Tips  The diabetes portion plate  M0N to eAG Conversion Chart  Diabetes Medications  Diabetes Recommended Care Schedule  Support Group  Diabetes Success Plan  Core Class Satisfaction Survey  Follow-Up Plan:  Attend core 2

## 2013-03-27 NOTE — Progress Notes (Signed)
Patient was seen on 03/22/13 for the first of a series of three diabetes self-management courses at the [facility].  Current HbA1c: 6.6% 01/2013  The following learning objectives were met by the patient during this class:  Describe diabetes  State some common risk factors for diabetes  Defines the role of glucose and insulin  Identifies type of diabetes and pathophysiology  Describe the relationship between diabetes and cardiovascular risk  State the members of the Healthcare Team  States the rationale for glucose monitoring  State when to test glucose  State their individual Target Range  State the importance of logging glucose readings  Describe how to interpret glucose readings  Identifies A1C target  Explain the correlation between A1c and eAG values  State symptoms and treatment of high blood glucose  State symptoms and treatment of low blood glucose  Explain proper technique for glucose testing  Identifies proper sharps disposal  Handouts given during class include:  Living Well with Diabetes book  Carb Counting and Meal Planning book  Meal Plan Card  Carbohydrate guide  Meal planning worksheet  Low Sodium Flavoring Tips  The diabetes portion plate  M4W to eAG Conversion Chart  Diabetes Medications  Diabetes Recommended Care Schedule  Support Group  Diabetes Success Plan  Core Class Satisfaction Survey  Follow-Up Plan:  Attend core 2

## 2013-04-05 ENCOUNTER — Encounter: Payer: Medicare Other | Attending: Family Medicine

## 2013-04-05 DIAGNOSIS — Z713 Dietary counseling and surveillance: Secondary | ICD-10-CM | POA: Insufficient documentation

## 2013-04-05 DIAGNOSIS — E119 Type 2 diabetes mellitus without complications: Secondary | ICD-10-CM | POA: Insufficient documentation

## 2013-04-09 NOTE — Progress Notes (Signed)
Patient was seen on 04/05/13 for the third of a series of three diabetes self-management courses at the [facility]. The following learning objectives were met by the patient during this class:    State the amount of activity recommended for healthy living   Describe activities suitable for individual needs   Identify ways to regularly incorporate activity into daily life   Identify barriers to activity and ways to over come these barriers  Identify diabetes medications being personally used and their primary action for lowering glucose and possible side effects   Describe role of stress on blood glucose and develop strategies to address psychosocial issues   Identify diabetes complications and ways to prevent them  Explain how to manage diabetes during illness   Evaluate success in meeting personal goal   Establish 2-3 goals that they will plan to diligently work on until they return for the  64-monthfollow-up visit  Goals:  Follow Diabetes Meal Plan as instructed  Aim for 15-30 mins of physical activity daily as tolerated  Bring food record and glucose log to your follow up visit  Your patient has established the following 4 month goals in their individualized success plan:  Count carbohydrates at most meal and snacks  Increase my activity by 30 minutes 3 X week  Your patient has identified these potential barriers to change:  Motivation to exercise  Find a friend to move with and or encourage  Your patient has identified their diabetes self-care support plan as  [facility]  Confide in family and friend

## 2013-05-06 ENCOUNTER — Encounter: Payer: Self-pay | Admitting: Family Medicine

## 2013-05-06 ENCOUNTER — Ambulatory Visit (INDEPENDENT_AMBULATORY_CARE_PROVIDER_SITE_OTHER): Payer: Medicare Other | Admitting: Family Medicine

## 2013-05-06 VITALS — BP 110/72 | HR 88 | Ht 65.0 in | Wt 283.0 lb

## 2013-05-06 DIAGNOSIS — E119 Type 2 diabetes mellitus without complications: Secondary | ICD-10-CM | POA: Diagnosis not present

## 2013-05-06 DIAGNOSIS — E785 Hyperlipidemia, unspecified: Secondary | ICD-10-CM | POA: Diagnosis not present

## 2013-05-06 DIAGNOSIS — E1169 Type 2 diabetes mellitus with other specified complication: Secondary | ICD-10-CM | POA: Insufficient documentation

## 2013-05-06 DIAGNOSIS — I10 Essential (primary) hypertension: Secondary | ICD-10-CM

## 2013-05-06 LAB — POCT GLYCOSYLATED HEMOGLOBIN (HGB A1C): Hemoglobin A1C: 6.1

## 2013-05-06 NOTE — Progress Notes (Signed)
   Subjective:    Patient ID: Carla Wright, female    DOB: November 09, 1945, 68 y.o.   MRN: [id number]  HPI She is here for a recheck. She has been walking a mile several times per week. She is also made some dietary adjustments. She has gone to the diabetes nutrition Center and has gotten some good information from this. She is incorporating that into her new lifestyle. Smoking and drinking were reviewed. She was educated on foot care, eye care and checking blood sugars. She does not have a glucometer.   Review of Systems     Objective:   Physical Exam Alert and in no distress. Hemoglobin A1c 6.1       Assessment & Plan:  Diabetes mellitus, new onset - Plan: HgB A1c  Hypertension  Morbid obesity  Hyperlipidemia LDL goal < 70  I reinforced the fact that she needs to continue with her new active lifestyle and continued weight loss. Discussed checking her blood sugars. We'll educate her concerning this. Recheck in 4 months.

## 2013-05-07 DIAGNOSIS — Z85828 Personal history of other malignant neoplasm of skin: Secondary | ICD-10-CM | POA: Diagnosis not present

## 2013-05-07 DIAGNOSIS — I781 Nevus, non-neoplastic: Secondary | ICD-10-CM | POA: Diagnosis not present

## 2013-05-07 DIAGNOSIS — D485 Neoplasm of uncertain behavior of skin: Secondary | ICD-10-CM | POA: Diagnosis not present

## 2013-05-07 DIAGNOSIS — D239 Other benign neoplasm of skin, unspecified: Secondary | ICD-10-CM | POA: Diagnosis not present

## 2013-05-07 DIAGNOSIS — L821 Other seborrheic keratosis: Secondary | ICD-10-CM | POA: Diagnosis not present

## 2013-05-07 DIAGNOSIS — L570 Actinic keratosis: Secondary | ICD-10-CM | POA: Diagnosis not present

## 2013-05-08 DIAGNOSIS — C44519 Basal cell carcinoma of skin of other part of trunk: Secondary | ICD-10-CM | POA: Diagnosis not present

## 2013-05-08 DIAGNOSIS — L678 Other hair color and hair shaft abnormalities: Secondary | ICD-10-CM | POA: Diagnosis not present

## 2013-05-08 DIAGNOSIS — L738 Other specified follicular disorders: Secondary | ICD-10-CM | POA: Diagnosis not present

## 2013-05-10 ENCOUNTER — Encounter: Payer: Self-pay | Admitting: Family Medicine

## 2013-06-20 ENCOUNTER — Other Ambulatory Visit: Payer: Self-pay | Admitting: Family Medicine

## 2013-06-20 DIAGNOSIS — L570 Actinic keratosis: Secondary | ICD-10-CM | POA: Diagnosis not present

## 2013-06-20 DIAGNOSIS — C44519 Basal cell carcinoma of skin of other part of trunk: Secondary | ICD-10-CM | POA: Diagnosis not present

## 2013-07-01 ENCOUNTER — Ambulatory Visit: Payer: 59

## 2013-07-29 ENCOUNTER — Encounter: Payer: Medicare Other | Attending: Family Medicine

## 2013-07-29 DIAGNOSIS — Z713 Dietary counseling and surveillance: Secondary | ICD-10-CM | POA: Insufficient documentation

## 2013-07-29 DIAGNOSIS — E119 Type 2 diabetes mellitus without complications: Secondary | ICD-10-CM | POA: Insufficient documentation

## 2013-07-29 NOTE — Progress Notes (Signed)
Patient was seen on 07/29/2013 for a review of the series of three diabetes self-management courses at the [facility]. The following learning objectives were met by the patient during this class:    Reviewed blood glucose monitoring and interpretation including the recommended target ranges and Hgb A1c.    Reviewed on carb counting, importance of regularly scheduled meals/snacks, and meal planning.    Reviewed the effects of physical activity on glucose levels and long-term glucose control.  Recommended goal of 150 minutes of physical activity/week.   Reviewed patient medications and discussed role of medication on blood glucose and possible side effects.   Discussed strategies to manage stress, psychosocial issues, and other obstacles to diabetes management.   Encouraged moderate weight reduction to improve glucose levels.     Reviewed short-term complications: hyper- and hypo-glycemia.  Discussed causes, symptoms, and treatment options.   Reviewed prevention, detection, and treatment of long-term complications.  Discussed the role of prolonged elevated glucose levels on body systems.  Goals:  Follow Diabetes Meal Plan as instructed  Eat 3 meals and 2 snacks, every 3-5 hrs  Limit carbohydrate intake to 45 grams carbohydrate/meal Limit carbohydrate intake to 15 grams carbohydrate/snack Add lean protein foods to meals/snacks  Monitor glucose levels as instructed by your doctor  Aim for goal of 15-30 mins of physical activity daily as tolerated  Bring food record and glucose log to your next nutrition visit   

## 2013-09-09 ENCOUNTER — Ambulatory Visit (INDEPENDENT_AMBULATORY_CARE_PROVIDER_SITE_OTHER): Payer: Medicare Other | Admitting: Family Medicine

## 2013-09-09 ENCOUNTER — Encounter: Payer: Self-pay | Admitting: Family Medicine

## 2013-09-09 VITALS — BP 118/80 | HR 87 | Wt 266.0 lb

## 2013-09-09 DIAGNOSIS — E039 Hypothyroidism, unspecified: Secondary | ICD-10-CM

## 2013-09-09 DIAGNOSIS — E1169 Type 2 diabetes mellitus with other specified complication: Secondary | ICD-10-CM | POA: Diagnosis not present

## 2013-09-09 DIAGNOSIS — E119 Type 2 diabetes mellitus without complications: Secondary | ICD-10-CM | POA: Diagnosis not present

## 2013-09-09 DIAGNOSIS — Z23 Encounter for immunization: Secondary | ICD-10-CM

## 2013-09-09 DIAGNOSIS — E1159 Type 2 diabetes mellitus with other circulatory complications: Secondary | ICD-10-CM

## 2013-09-09 DIAGNOSIS — E785 Hyperlipidemia, unspecified: Secondary | ICD-10-CM | POA: Diagnosis not present

## 2013-09-09 DIAGNOSIS — I10 Essential (primary) hypertension: Secondary | ICD-10-CM

## 2013-09-09 LAB — POCT GLYCOSYLATED HEMOGLOBIN (HGB A1C): Hemoglobin A1C: 6.2

## 2013-09-09 NOTE — Progress Notes (Signed)
  Subjective:    Carla Wright is a 68 y.o. female who presents for follow-up of Type 2 diabetes mellitus.  Review of record indicates need followup on cholesterol as well as on her thyroid.  Home blood sugar records: AVG 120  Current symptoms/problems NONE Daily foot checks: Any foot concerns: NONE Last eye exam:  JAN 2015 [facility]   Medication compliance: Good Current diet:LOW CARBS. She has been to the diabetes education and is making good progress on lowering her carbs and carb counting. Current exercise: WALKING 3 DAYS A WEEK 15 TO 20 MIN A WEEK Known diabetic complications: none Cardiovascular risk factors: advanced age (older than 58 for men, 29 for women), diabetes mellitus, dyslipidemia and obesity (BMI >= 30 kg/m2)   The following portions of the patient's history were reviewed and updated as appropriate: allergies, current medications, past family history, past medical history, past social history and problem list.  ROS as in subjective above    Objective:     General appearence: alert, no distress, WD/WN Lab Review Lab Results  Component Value Date   HGBA1C 6.1 05/06/2013   Lab Results  Component Value Date   CHOL 178 03/07/2013   HDL 47 03/07/2013   LDLCALC 101* 03/07/2013   TRIG 148 03/07/2013   CHOLHDL 3.8 03/07/2013      Chemistry      Component Value Date/Time   NA 136 01/23/2013 1255   K 3.8 01/23/2013 1255   CL 101 01/23/2013 1255   CO2 24 01/23/2013 1255   BUN 22 01/23/2013 1255   CREATININE 0.81 01/23/2013 1255   CREATININE 0.92 12/17/2011 0416      Component Value Date/Time   CALCIUM 10.1 01/23/2013 1255   ALKPHOS 72 01/23/2013 1255   AST 15 01/23/2013 1255   ALT 16 01/23/2013 1255   BILITOT 0.6 01/23/2013 1255        Chemistry      Component Value Date/Time   NA 136 01/23/2013 1255   K 3.8 01/23/2013 1255   CL 101 01/23/2013 1255   CO2 24 01/23/2013 1255   BUN 22 01/23/2013 1255   CREATININE 0.81 01/23/2013 1255   CREATININE 0.92  12/17/2011 0416      Component Value Date/Time   CALCIUM 10.1 01/23/2013 1255   ALKPHOS 72 01/23/2013 1255   AST 15 01/23/2013 1255   ALT 16 01/23/2013 1255   BILITOT 0.6 01/23/2013 1255     Hemoglobin A1c 6.2    Assessment:  Need for prophylactic vaccination against Streptococcus pneumoniae (pneumococcus) - Plan: Pneumococcal conjugate vaccine 13-valent  Morbid obesity  Hypothyroidism, unspecified hypothyroidism type - Plan: TSH  Hypertension associated with diabetes  Hyperlipidemia with target LDL less than 70 - Plan: Lipid panel  Type II or unspecified type diabetes mellitus without mention of complication, not stated as uncontrolled - Plan: POCT glycosylated hemoglobin (Hb A1C)         Plan:    1.  Rx changes: none 2.  Education: Reviewed 'ABCs' of diabetes management (respective goals in parentheses):  A1C (<7), blood pressure (<130/80), and cholesterol (LDL <100). 3.  Compliance at present is estimated to be good. Efforts to improve compliance (if necessary) will be directed at dietary modifications: Continue. 4. Follow up: 4 months  Encouraged her to continue with her carb counting and to increase her physical activity to 15-20 minutes of walking every day.

## 2013-09-09 NOTE — Patient Instructions (Signed)
Walk every day for 15 to 20 minutes

## 2013-09-10 DIAGNOSIS — L570 Actinic keratosis: Secondary | ICD-10-CM | POA: Diagnosis not present

## 2013-09-10 DIAGNOSIS — Z85828 Personal history of other malignant neoplasm of skin: Secondary | ICD-10-CM | POA: Diagnosis not present

## 2013-09-10 LAB — LIPID PANEL
Cholesterol: 115 mg/dL (ref 0–200)
HDL: 40 mg/dL (ref >39)
LDL Cholesterol: 56 mg/dL (ref 0–99)
Total CHOL/HDL Ratio: 2.9 Ratio
Triglycerides: 96 mg/dL (ref <150)
VLDL: 19 mg/dL (ref 0–40)

## 2013-09-10 LAB — TSH: TSH: 3.285 u[IU]/mL (ref 0.350–4.500)

## 2013-09-18 ENCOUNTER — Telehealth: Payer: Self-pay | Admitting: Internal Medicine

## 2013-09-18 MED ORDER — LISINOPRIL-HYDROCHLOROTHIAZIDE 20-12.5 MG PO TABS: ORAL_TABLET | ORAL | Status: DC

## 2013-09-18 NOTE — Telephone Encounter (Signed)
Refill request for lisinopril-hctz to [facility]

## 2013-10-25 ENCOUNTER — Encounter: Payer: Self-pay | Admitting: Internal Medicine

## 2013-12-11 ENCOUNTER — Telehealth: Payer: Self-pay | Admitting: Family Medicine

## 2013-12-11 ENCOUNTER — Other Ambulatory Visit: Payer: Self-pay

## 2013-12-11 MED ORDER — LISINOPRIL-HYDROCHLOROTHIAZIDE 20-12.5 MG PO TABS: ORAL_TABLET | ORAL | Status: DC

## 2013-12-11 NOTE — Telephone Encounter (Signed)
[facility] refill Lisinopril-HCTZ 20-12.5 tab #90

## 2013-12-11 NOTE — Telephone Encounter (Signed)
DONE

## 2014-01-13 ENCOUNTER — Encounter: Payer: Self-pay | Admitting: Family Medicine

## 2014-01-13 ENCOUNTER — Ambulatory Visit (INDEPENDENT_AMBULATORY_CARE_PROVIDER_SITE_OTHER): Payer: Medicare Other | Admitting: Family Medicine

## 2014-01-13 VITALS — BP 122/80 | HR 93 | Wt 255.0 lb

## 2014-01-13 DIAGNOSIS — E119 Type 2 diabetes mellitus without complications: Secondary | ICD-10-CM

## 2014-01-13 DIAGNOSIS — I152 Hypertension secondary to endocrine disorders: Secondary | ICD-10-CM

## 2014-01-13 DIAGNOSIS — E039 Hypothyroidism, unspecified: Secondary | ICD-10-CM | POA: Diagnosis not present

## 2014-01-13 DIAGNOSIS — E1169 Type 2 diabetes mellitus with other specified complication: Secondary | ICD-10-CM | POA: Diagnosis not present

## 2014-01-13 DIAGNOSIS — M10079 Idiopathic gout, unspecified ankle and foot: Secondary | ICD-10-CM

## 2014-01-13 DIAGNOSIS — I10 Essential (primary) hypertension: Secondary | ICD-10-CM

## 2014-01-13 DIAGNOSIS — M1000 Idiopathic gout, unspecified site: Secondary | ICD-10-CM | POA: Diagnosis not present

## 2014-01-13 DIAGNOSIS — Z23 Encounter for immunization: Secondary | ICD-10-CM | POA: Diagnosis not present

## 2014-01-13 DIAGNOSIS — E785 Hyperlipidemia, unspecified: Secondary | ICD-10-CM | POA: Diagnosis not present

## 2014-01-13 DIAGNOSIS — E1159 Type 2 diabetes mellitus with other circulatory complications: Secondary | ICD-10-CM

## 2014-01-13 DIAGNOSIS — G4733 Obstructive sleep apnea (adult) (pediatric): Secondary | ICD-10-CM

## 2014-01-13 LAB — COMPREHENSIVE METABOLIC PANEL
ALT: 15 U/L (ref 0–35)
AST: 14 U/L (ref 0–37)
Albumin: 4 g/dL (ref 3.5–5.2)
Alkaline Phosphatase: 86 U/L (ref 39–117)
BUN: 20 mg/dL (ref 6–23)
CO2: 24 mEq/L (ref 19–32)
Calcium: 9.6 mg/dL (ref 8.4–10.5)
Chloride: 100 mEq/L (ref 96–112)
Creat: 0.96 mg/dL (ref 0.50–1.10)
Glucose, Bld: 112 mg/dL — ABNORMAL HIGH (ref 70–99)
Potassium: 3.8 mEq/L (ref 3.5–5.3)
Sodium: 137 mEq/L (ref 135–145)
Total Bilirubin: 0.6 mg/dL (ref 0.2–1.2)
Total Protein: 7 g/dL (ref 6.0–8.3)

## 2014-01-13 LAB — CBC WITH DIFFERENTIAL/PLATELET
Basophils Absolute: 0 10*3/uL (ref 0.0–0.1)
Basophils Relative: 0 % (ref 0–1)
Eosinophils Absolute: 0.2 10*3/uL (ref 0.0–0.7)
Eosinophils Relative: 3 % (ref 0–5)
HCT: 41.7 % (ref 36.0–46.0)
Hemoglobin: 13.6 g/dL (ref 12.0–15.0)
Lymphocytes Relative: 27 % (ref 12–46)
Lymphs Abs: 2 10*3/uL (ref 0.7–4.0)
MCH: 30 pg (ref 26.0–34.0)
MCHC: 32.6 g/dL (ref 30.0–36.0)
MCV: 92.1 fL (ref 78.0–100.0)
MPV: 9.9 fL (ref 9.4–12.4)
Monocytes Absolute: 0.5 10*3/uL (ref 0.1–1.0)
Monocytes Relative: 6 % (ref 3–12)
Neutro Abs: 4.8 10*3/uL (ref 1.7–7.7)
Neutrophils Relative %: 64 % (ref 43–77)
Platelets: 222 10*3/uL (ref 150–400)
RBC: 4.53 MIL/uL (ref 3.87–5.11)
RDW: 14.3 % (ref 11.5–15.5)
WBC: 7.5 10*3/uL (ref 4.0–10.5)

## 2014-01-13 LAB — LIPID PANEL
Cholesterol: 113 mg/dL (ref 0–200)
HDL: 48 mg/dL (ref >39)
LDL Cholesterol: 50 mg/dL (ref 0–99)
Total CHOL/HDL Ratio: 2.4 Ratio
Triglycerides: 73 mg/dL (ref <150)
VLDL: 15 mg/dL (ref 0–40)

## 2014-01-13 LAB — URIC ACID: Uric Acid, Serum: 4.6 mg/dL (ref 2.4–7.0)

## 2014-01-13 LAB — POCT GLYCOSYLATED HEMOGLOBIN (HGB A1C): Hemoglobin A1C: 5.9

## 2014-01-13 NOTE — Progress Notes (Signed)
Subjective:    Carla Wright is a 68 y.o. female who presents for follow-up of Type 2 diabetes mellitus.  He does have underlying sleep apnea however is not using her machine regularly. She has had no difficulty with her gout.  Home blood sugar records: Patient test one time a day  Current symptoms/probl     none Daily foot checks:  Any foot concerns: none Last eye exam: 02/2013    Medication compliance:excellent Current diet: none watching carbs Current exercise:none Known diabetic complications: none Cardiovascular risk factors: advanced age (older than 86 for men, 76 for women), diabetes mellitus, dyslipidemia, obesity (BMI >= 30 kg/m2) and sedentary lifestyle   The following portions of the patient's history were reviewed and updated as appropriate: allergies, current medications, past medical history, past social history and problem list.  ROS as in subjective above    Objective:    There were no vitals taken for this visit.  There were no vitals filed for this visit.  General appearence: alert, no distress, WD/WN Neck: supple, no lymphadenopathy, no thyromegaly, no masses Heart: RRR, normal S1, S2, no murmurs Lungs: CTA bilaterally, no wheezes, rhonchi, or rales Abdomen: +bs, soft, non tender, non distended, no masses, no hepatomegaly, no splenomegaly Pulses: 2+ symmetric, upper and lower extremities, normal cap refill Ext: no edema Foot exam:  Neuro: foot monofilament exam normal   Lab Review Lab Results  Component Value Date   HGBA1C 6.2 09/09/2013   Lab Results  Component Value Date   CHOL 115 09/09/2013   HDL 40 09/09/2013   LDLCALC 56 09/09/2013   TRIG 96 09/09/2013   CHOLHDL 2.9 09/09/2013   No results found for: Derl Barrow   Chemistry      Component Value Date/Time   NA 136 01/23/2013 1255   K 3.8 01/23/2013 1255   CL 101 01/23/2013 1255   CO2 24 01/23/2013 1255   BUN 22 01/23/2013 1255   CREATININE 0.81 01/23/2013 1255   CREATININE  0.92 12/17/2011 0416      Component Value Date/Time   CALCIUM 10.1 01/23/2013 1255   ALKPHOS 72 01/23/2013 1255   AST 15 01/23/2013 1255   ALT 16 01/23/2013 1255   BILITOT 0.6 01/23/2013 1255        Chemistry      Component Value Date/Time   NA 136 01/23/2013 1255   K 3.8 01/23/2013 1255   CL 101 01/23/2013 1255   CO2 24 01/23/2013 1255   BUN 22 01/23/2013 1255   CREATININE 0.81 01/23/2013 1255   CREATININE 0.92 12/17/2011 0416      Component Value Date/Time   CALCIUM 10.1 01/23/2013 1255   ALKPHOS 72 01/23/2013 1255   AST 15 01/23/2013 1255   ALT 16 01/23/2013 1255   BILITOT 0.6 01/23/2013 1255       Hemoglobin A1c 5.9  Assessment:  Type 2 diabetes mellitus without complication - Plan: POCT glycosylated hemoglobin (Hb A1C), CBC with Differential, Comprehensive metabolic panel, Lipid panel, CANCELED: POCT UA - Microalbumin  Need for prophylactic vaccination and inoculation against influenza - Plan: Flu vaccine HIGH DOSE PF (Fluzone Tri High dose)  Hypertension associated with diabetes  Idiopathic gout of foot, unspecified chronicity, unspecified laterality - Plan: Uric Acid  Hyperlipidemia associated with type 2 diabetes mellitus - Plan: Lipid panel  Hypothyroidism, unspecified hypothyroidism type  Morbid obesity  Sleep apnea, obstructive        Plan:    1.  Rx changes: none 2.  Education: Reviewed 'ABCs'  of diabetes management (respective goals in parentheses):  A1C (<7), blood pressure (<130/80), and cholesterol (LDL <100). 3.  Compliance at present is estimated to be excellent. Efforts to improve compliance (if necessary) will be directed at increased exercise. 4. Follow up: 4 months   Discussed exercise with her. Recommended water aerobics or some other group type activity as she does tend to do better in group type activities. Discussed continued weight loss and potentially getting down to a dress size of 8-10.

## 2014-01-29 ENCOUNTER — Other Ambulatory Visit: Payer: Self-pay | Admitting: Family Medicine

## 2014-03-10 DIAGNOSIS — E119 Type 2 diabetes mellitus without complications: Secondary | ICD-10-CM | POA: Diagnosis not present

## 2014-03-10 LAB — HM DIABETES EYE EXAM

## 2014-03-12 ENCOUNTER — Other Ambulatory Visit: Payer: Self-pay | Admitting: Family Medicine

## 2014-03-27 ENCOUNTER — Encounter: Payer: Self-pay | Admitting: Internal Medicine

## 2014-04-21 IMAGING — CR DG HIP 1V PORT*R*
1 series · 1 of 1 positions shown · non-contrast
Comparison: Preoperative study 08/15/2011.

CLINICAL DATA: 66-year-old female status post right hip
arthroplasty.

PORTABLE PELVIS,
PORTABLE RIGHT HIP - 1 VIEW

[AP]
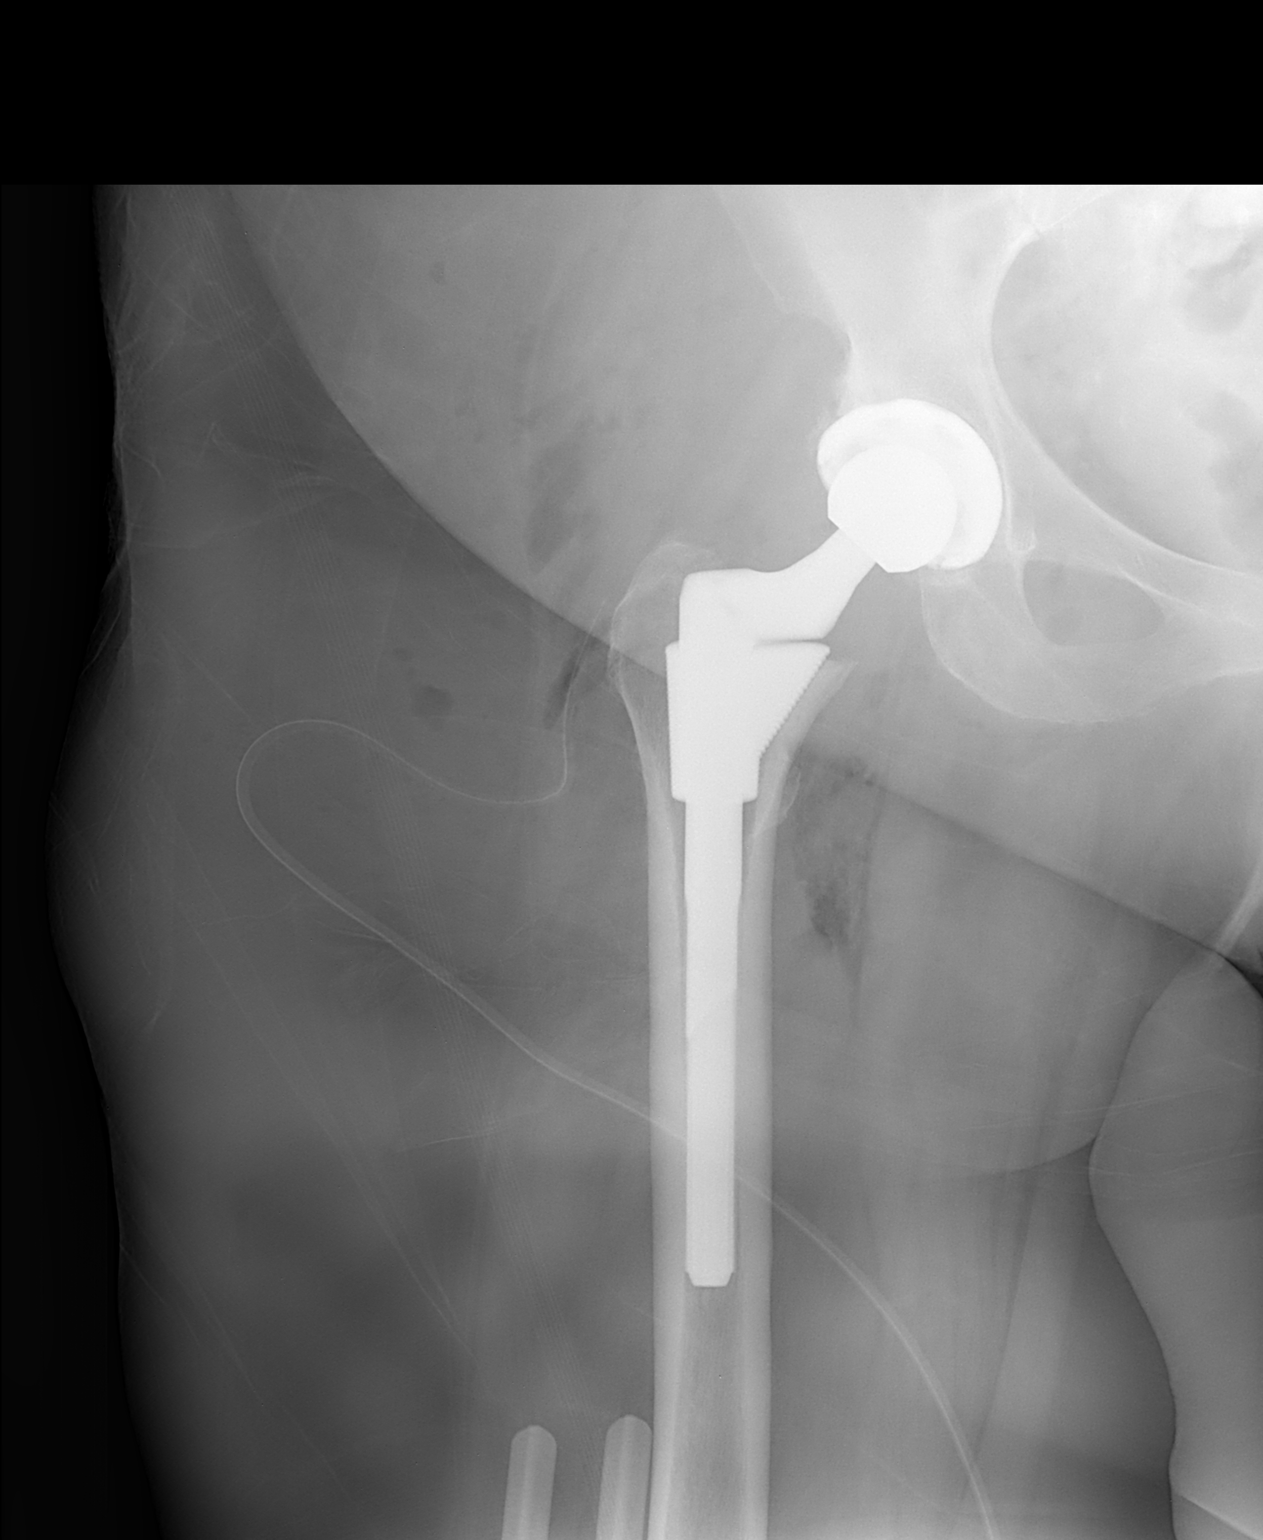

[1 of 1 positions shown; findings below may reference images not displayed]

FINDINGS: Pelvis:  AP portable view at 2520 hours.  Right bipolar hip
arthroplasty.  Components appear normally aligned on this view.
Postoperative drain in place.  No unexpected osseous changes.
Severe degenerative changes at the left hip again noted.

AP hip:  Portable view at 7872 hours.  The entire right femoral
implant now is visible.  Hardware appears intact normally aligned.
Postoperative drain in place.  No unexpected osseous changes.
IMPRESSION: Right hip arthroplasty with no adverse features.

## 2014-04-28 ENCOUNTER — Other Ambulatory Visit: Payer: Self-pay

## 2014-04-28 ENCOUNTER — Telehealth: Payer: Self-pay | Admitting: Family Medicine

## 2014-04-28 MED ORDER — ATORVASTATIN CALCIUM 10 MG PO TABS: 10.0000 mg | ORAL_TABLET | Freq: Every day | ORAL | Status: DC

## 2014-04-28 MED ORDER — LISINOPRIL-HYDROCHLOROTHIAZIDE 20-12.5 MG PO TABS: ORAL_TABLET | ORAL | Status: DC

## 2014-04-28 MED ORDER — ALLOPURINOL 300 MG PO TABS: 300.0000 mg | ORAL_TABLET | Freq: Every day | ORAL | Status: DC

## 2014-04-28 MED ORDER — LEVOTHYROXINE SODIUM 88 MCG PO TABS: 88.0000 ug | ORAL_TABLET | Freq: Every day | ORAL | Status: DC

## 2014-04-28 NOTE — Telephone Encounter (Signed)
Done and sent to the only [facility] in [place]

## 2014-04-28 NOTE — Telephone Encounter (Signed)
Requesting refill on Allopurinol 300mg , Synthroid 31mcg, Lipitor 10mg , Lisinopril 20-12.5mg  to her new pharmacy [facility] in [facility]

## 2014-05-13 ENCOUNTER — Ambulatory Visit: Payer: Medicare Other | Admitting: Family Medicine

## 2014-05-19 ENCOUNTER — Ambulatory Visit: Payer: Medicare Other | Admitting: Family Medicine

## 2014-05-21 DIAGNOSIS — Z86018 Personal history of other benign neoplasm: Secondary | ICD-10-CM | POA: Diagnosis not present

## 2014-05-21 DIAGNOSIS — Z85828 Personal history of other malignant neoplasm of skin: Secondary | ICD-10-CM | POA: Diagnosis not present

## 2014-05-21 DIAGNOSIS — D489 Neoplasm of uncertain behavior, unspecified: Secondary | ICD-10-CM | POA: Diagnosis not present

## 2014-05-21 DIAGNOSIS — L570 Actinic keratosis: Secondary | ICD-10-CM | POA: Diagnosis not present

## 2014-05-21 DIAGNOSIS — L821 Other seborrheic keratosis: Secondary | ICD-10-CM | POA: Diagnosis not present

## 2014-05-28 ENCOUNTER — Ambulatory Visit (INDEPENDENT_AMBULATORY_CARE_PROVIDER_SITE_OTHER): Payer: Medicare Other | Admitting: Medical

## 2014-05-28 ENCOUNTER — Encounter: Payer: Self-pay | Admitting: Medical

## 2014-05-28 VITALS — BP 102/60 | HR 83 | Temp 97.9°F | Resp 14 | Wt 248.0 lb

## 2014-05-28 DIAGNOSIS — R350 Frequency of micturition: Secondary | ICD-10-CM | POA: Diagnosis not present

## 2014-05-28 DIAGNOSIS — R300 Dysuria: Secondary | ICD-10-CM

## 2014-05-28 DIAGNOSIS — R195 Other fecal abnormalities: Secondary | ICD-10-CM

## 2014-05-28 LAB — POCT URINALYSIS DIPSTICK
Bilirubin, UA: NEGATIVE
Glucose, UA: NEGATIVE
Ketones, UA: NEGATIVE
Nitrite, UA: NEGATIVE
Spec Grav, UA: 1.02
Urobilinogen, UA: NEGATIVE
pH, UA: 6

## 2014-05-28 MED ORDER — SULFAMETHOXAZOLE-TRIMETHOPRIM 800-160 MG PO TABS: 1.0000 | ORAL_TABLET | Freq: Two times a day (BID) | ORAL | Status: DC

## 2014-05-28 NOTE — Progress Notes (Signed)
Subjective:  Carla Wright is a 69 y.o. female who complains of possible urinary tract infection.  She has had symptoms for 3 days.  Symptoms include urinary urgency, frequency, odor in urine, suprapubic pressure. Had 2 loose stools this morning. Patient denies back pain, fever and vaginal discharge.  Last UTI was years ago, has only had 2 prior.   Using increased water intake and some cranberry juice for current symptoms.  Patient does not have a history of recurrent UTI. Patient does not have a history of pyelonephritis.  No other aggravating or relieving factors.  No other c/o.  Past Medical History  Diagnosis Date  . Obesity   . Allergy   . Diverticulosis   . Thyroid disease   . Actinic keratoses   . Glucose intolerance (impaired glucose tolerance)   . Gout   . Hypertension     EKG and clearance Dr Judson Roch on chart  . Hypothyroidism   . Sleep apnea     doesnt used machine in months-per Dr Redmond School note- borderline  . Arthritis   . Cancer     basal cell removed from face    ROS as in subjective  Reviewed allergies, medications, past medical, surgical, and social history.    Objective: Filed Vitals:   05/28/14 1124  BP: 102/60  Pulse: 83  Temp: 97.9 F (36.6 C)  Resp: 14    General appearance: alert, no distress, WD/WN, female Abdomen: +bs, soft, mild LLQ and suprapubic tenderness, otherwise non tender, non distended, no masses, no hepatomegaly, no splenomegaly, no bruits Back: no CVA tenderness GU: deferred      Assessment: Encounter Diagnoses  Name Primary?  . Dysuria Yes  . Urinary frequency   . Loose stools     Plan: Discussed symptoms, diagnosis, possible complications, and usual course of illness.  Begin medication bactrim per orders below.  Advised increased water intake, can use OTC Tylenol for pain.    Advised few days of cranberry juice.   Urine culture sent.    Call or return if worse or not improving.

## 2014-06-09 ENCOUNTER — Ambulatory Visit: Payer: Medicare Other | Admitting: Family Medicine

## 2014-06-19 ENCOUNTER — Ambulatory Visit: Payer: Medicare Other | Admitting: Family Medicine

## 2014-06-25 ENCOUNTER — Ambulatory Visit: Payer: Medicare Other | Admitting: Family Medicine

## 2014-07-28 ENCOUNTER — Other Ambulatory Visit: Payer: Self-pay | Admitting: Family Medicine

## 2014-08-07 IMAGING — CR DG HIP (WITH OR WITHOUT PELVIS) 2-3V*L*
3 series · 3 of 3 positions shown · non-contrast
Comparison: January 09, 2008

CLINICAL DATA: Chronic hip pain

LEFT HIP - COMPLETE 2+ VIEW

[t pelvis a.p.]
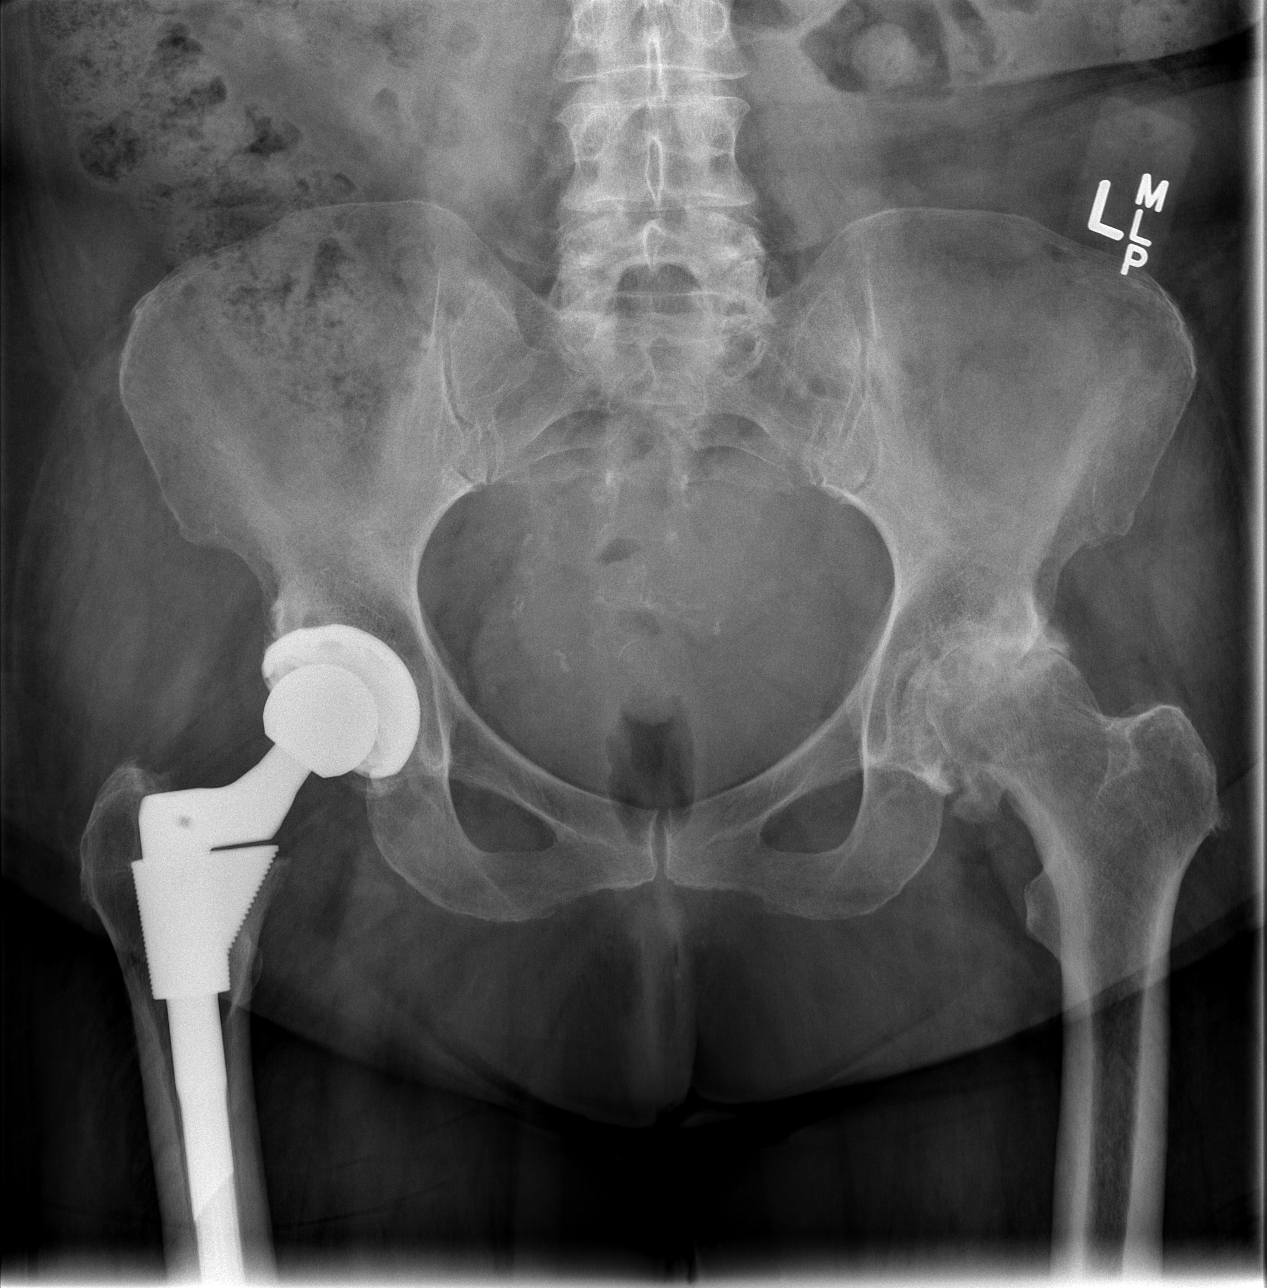

[t hip ap left]
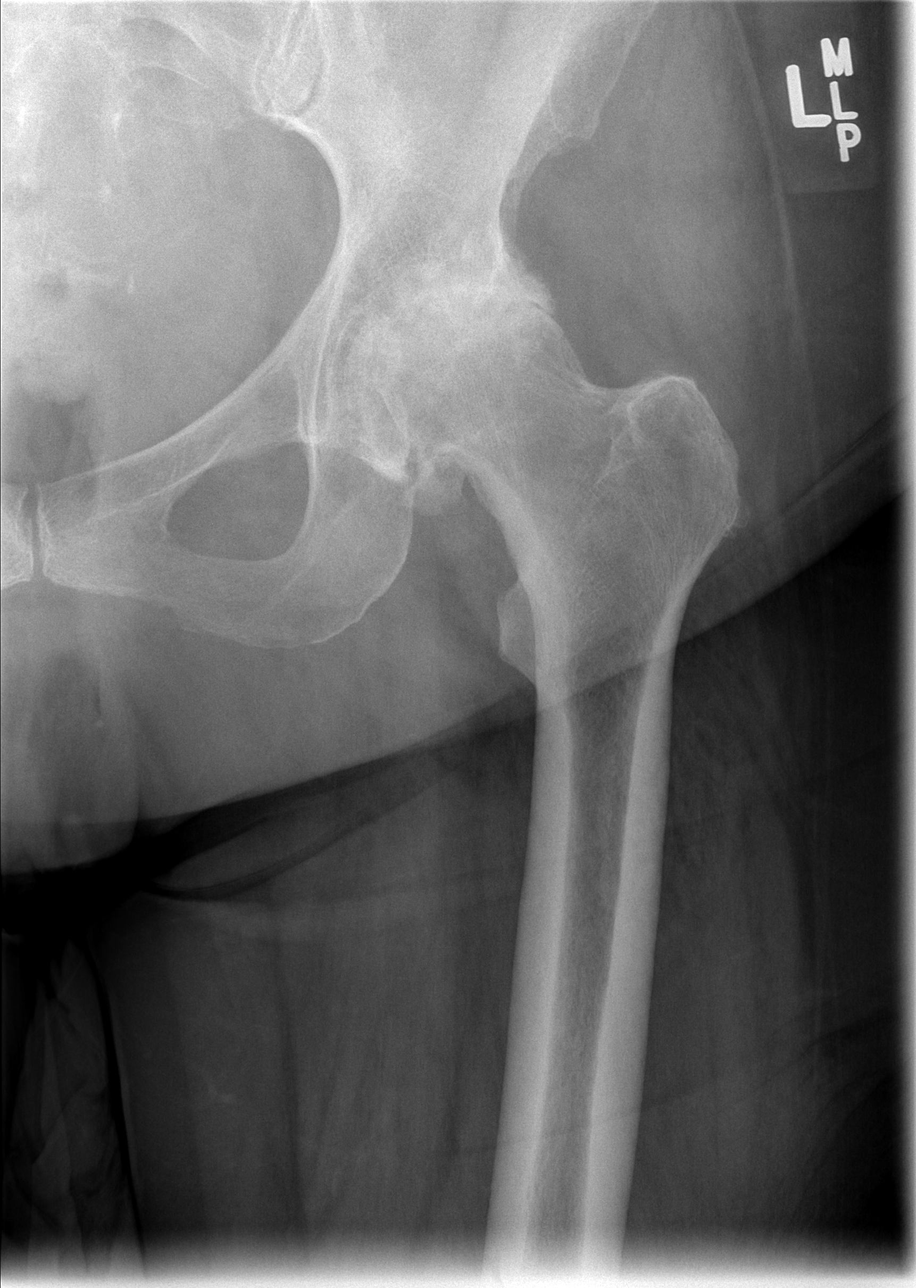

[t hip frog leg left]
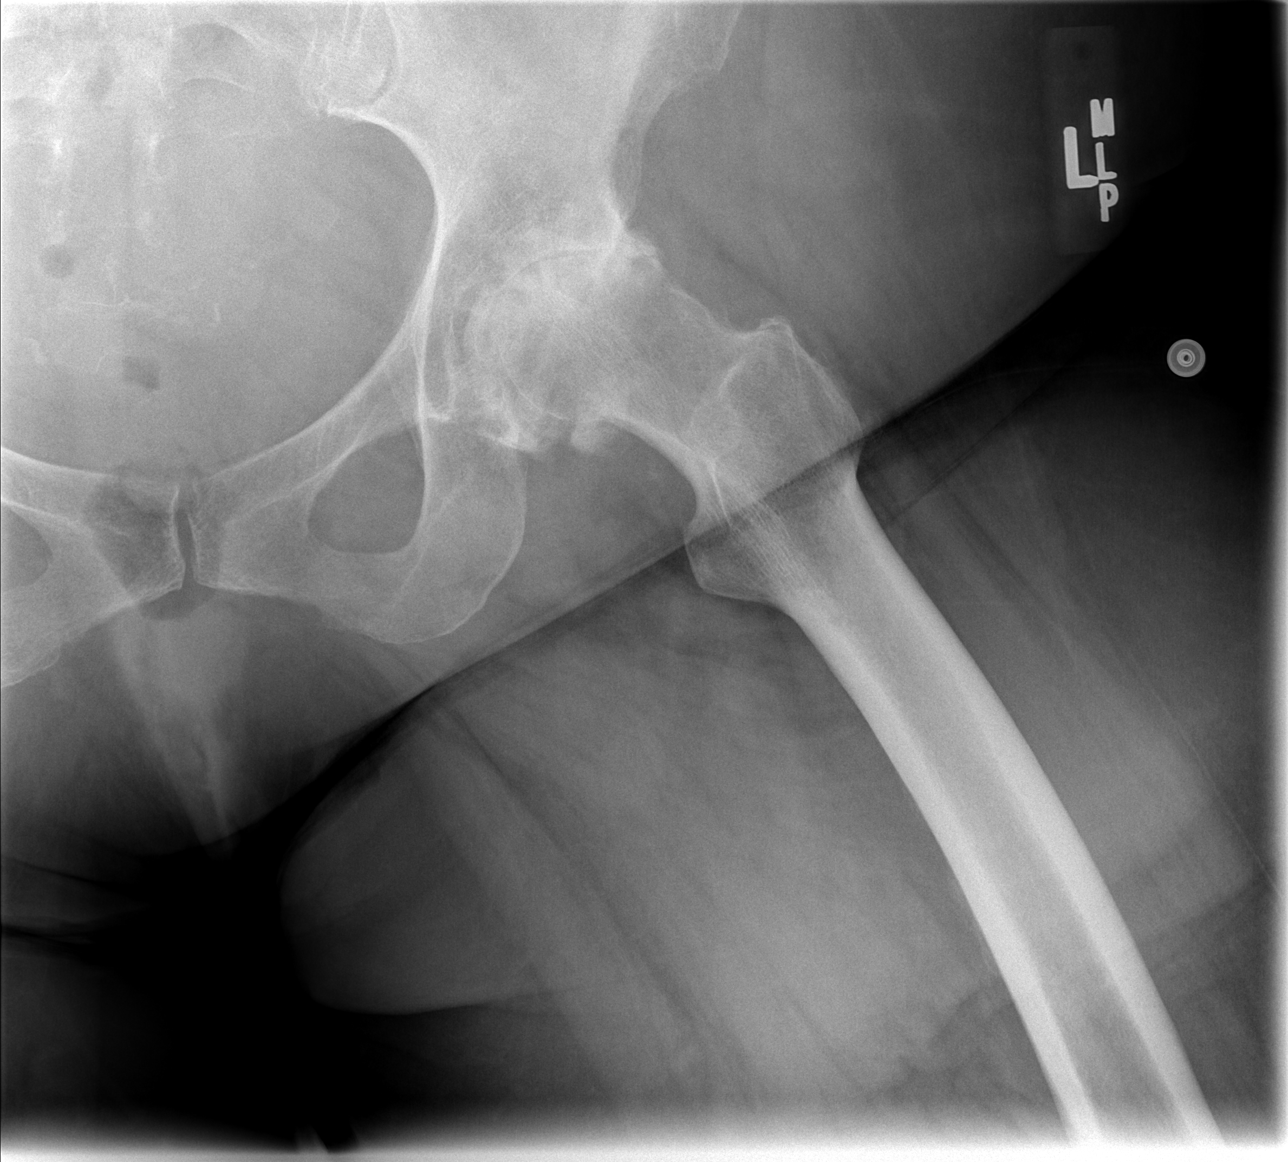

[3 of 3 positions shown; findings below may reference images not displayed]

FINDINGS: Frontal pelvis as well as frontal and lateral left hip
images were obtained.  There is a total hip prosthesis on the right
which is well seated.

There is advanced osteoarthritis in the left hip joint with
essentially complete loss of disc space and extensive remodelling
of the femoral head.  There is probable underlying avascular
necrosis in the left femoral head.  There has been progression of
disease in this area compared to prior study.

There is no acute fracture or dislocation.
IMPRESSION: Advanced osteoarthritis in the left hip joint as described. There
has been progression of disease since prior study.  There may be
underlying avascular necrosis in the left femoral head.

No acute fracture or dislocation.

Status post total hip replacement on the right with prosthetic
components appearing well seated.

## 2014-08-13 IMAGING — CR DG PORTABLE PELVIS
1 series · 2 of 2 positions shown · non-contrast
Comparison: 12/08/2011

CLINICAL DATA: Left hip arthroplasty

PORTABLE PELVIS

[Series 1: AP · U · 2 of 2 slices shown]
[im 1/2]
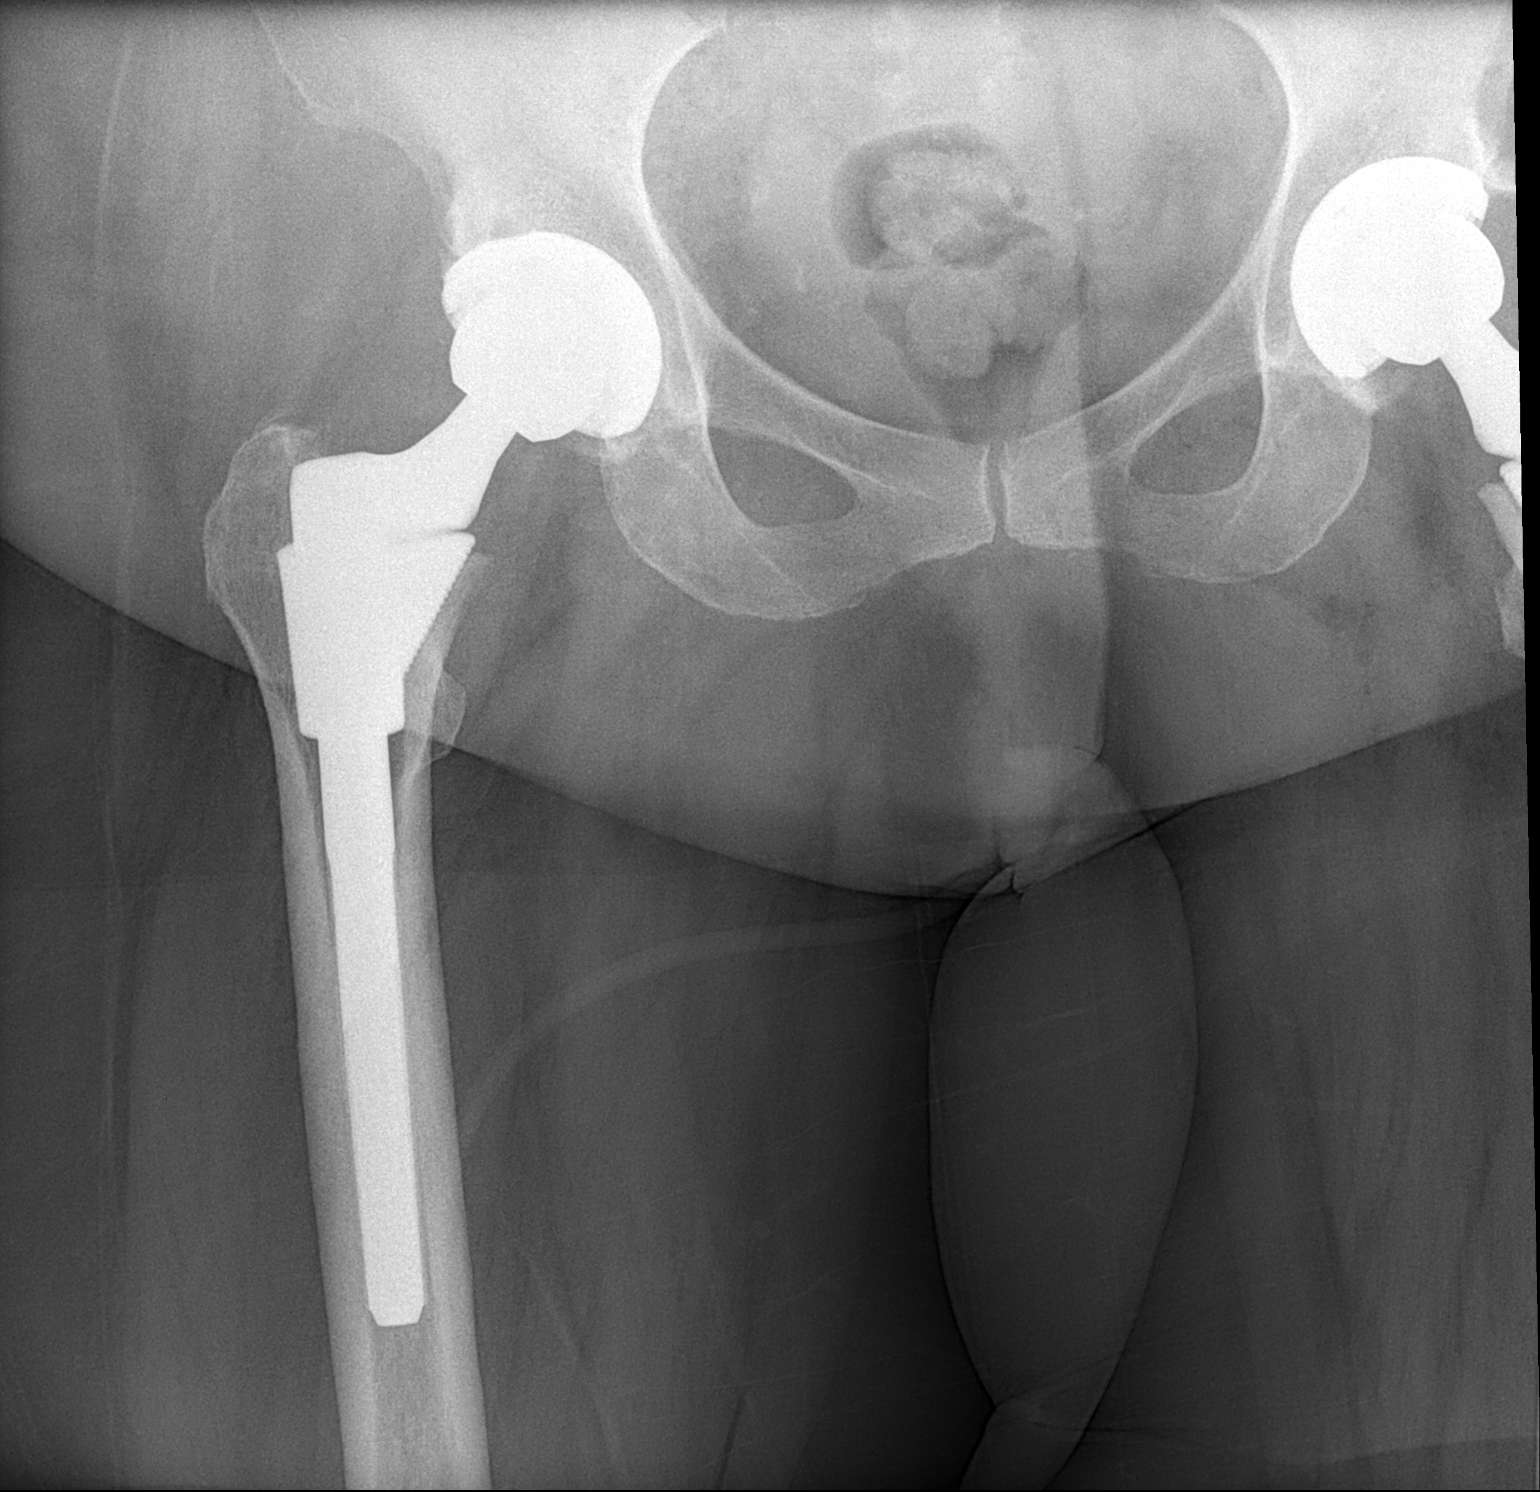
[im 2/2]
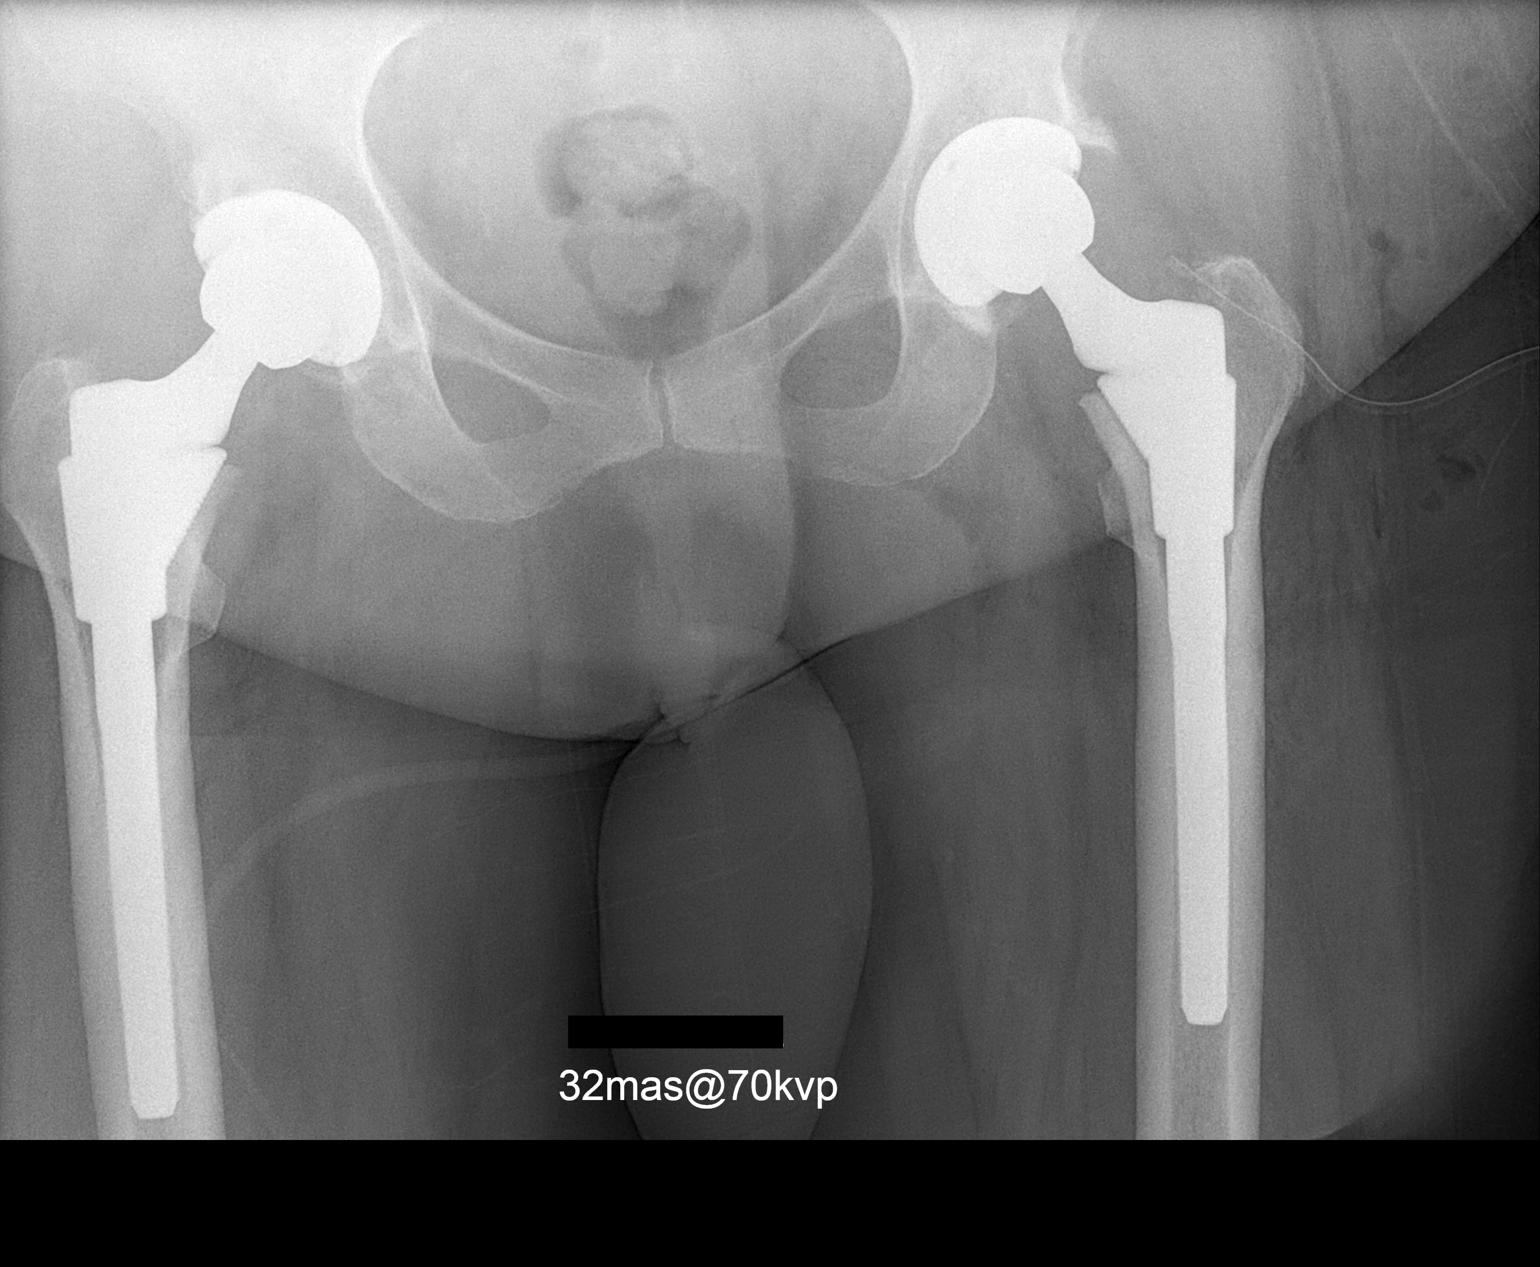

[2 of 2 positions shown; findings below may reference images not displayed]

FINDINGS: Left hip replacement in satisfactory position alignment.
No fracture or immediate complication.
IMPRESSION: Satisfactory left hip replacement.

## 2014-08-26 ENCOUNTER — Encounter: Payer: Self-pay | Admitting: Family Medicine

## 2014-08-26 ENCOUNTER — Ambulatory Visit (INDEPENDENT_AMBULATORY_CARE_PROVIDER_SITE_OTHER): Payer: Medicare Other | Admitting: Family Medicine

## 2014-08-26 VITALS — BP 126/82 | HR 70 | Ht 65.0 in | Wt 239.0 lb

## 2014-08-26 DIAGNOSIS — L570 Actinic keratosis: Secondary | ICD-10-CM

## 2014-08-26 DIAGNOSIS — Z8639 Personal history of other endocrine, nutritional and metabolic disease: Secondary | ICD-10-CM | POA: Diagnosis not present

## 2014-08-26 DIAGNOSIS — E1159 Type 2 diabetes mellitus with other circulatory complications: Secondary | ICD-10-CM

## 2014-08-26 DIAGNOSIS — K5730 Diverticulosis of large intestine without perforation or abscess without bleeding: Secondary | ICD-10-CM | POA: Diagnosis not present

## 2014-08-26 DIAGNOSIS — Z8739 Personal history of other diseases of the musculoskeletal system and connective tissue: Secondary | ICD-10-CM

## 2014-08-26 DIAGNOSIS — E785 Hyperlipidemia, unspecified: Secondary | ICD-10-CM

## 2014-08-26 DIAGNOSIS — I152 Hypertension secondary to endocrine disorders: Secondary | ICD-10-CM

## 2014-08-26 DIAGNOSIS — E038 Other specified hypothyroidism: Secondary | ICD-10-CM

## 2014-08-26 DIAGNOSIS — E119 Type 2 diabetes mellitus without complications: Secondary | ICD-10-CM | POA: Diagnosis not present

## 2014-08-26 DIAGNOSIS — G4733 Obstructive sleep apnea (adult) (pediatric): Secondary | ICD-10-CM

## 2014-08-26 DIAGNOSIS — Z96649 Presence of unspecified artificial hip joint: Secondary | ICD-10-CM

## 2014-08-26 DIAGNOSIS — I10 Essential (primary) hypertension: Secondary | ICD-10-CM

## 2014-08-26 DIAGNOSIS — E1169 Type 2 diabetes mellitus with other specified complication: Secondary | ICD-10-CM

## 2014-08-26 DIAGNOSIS — Z9660 Presence of unspecified orthopedic joint implant: Secondary | ICD-10-CM | POA: Diagnosis not present

## 2014-08-26 LAB — POCT UA - MICROALBUMIN
Albumin/Creatinine Ratio, Urine, POC: 37.3
Creatinine, POC: 18.5 mg/dL
Microalbumin Ur, POC: 49.6 mg/L

## 2014-08-26 LAB — POCT GLYCOSYLATED HEMOGLOBIN (HGB A1C): Hemoglobin A1C: 5.8

## 2014-08-26 NOTE — Progress Notes (Signed)
   Subjective:    Patient ID: Carla Wright, female    DOB: April 29, 1945, 69 y.o.   MRN: [id number]  HPI She is here for an AWV.Her medical and social history as well as family history was reviewed. She mainly gets her care through me but has occasionally seen Dr. He. She demonstrates no cognitive impairment. She's had no falls. Psychologically things are going quite well for her. She does have an advanced directive. She keeps himself quite active and has noted an increase in her strength due to an exercise program she started. She has lost weight during this timeframe. She does get regular eye checks and does check her feet. Smoking and drinking were reviewed. She does check her blood sugars periodically. Her health maintenance was reviewed and she will set up for a mammogram. She's had no GI symptoms referable to her diverticulosis. She does see her dermatologist regularly for follow-up on the actinic keratoses. She continues on her thyroid medication as well as allopurinol for gout. She does have sleep apnea but is not interested in using the CPAP. She finds it hard to use. She has had both hips replaced and is quite happy with that.   Review of Systems  All other systems reviewed and are negative.      Objective:   Physical Exam Alert and in no distress. Tympanic membranes and canals are normal. Pharyngeal area is normal. Neck is supple without adenopathy or thyromegaly. Cardiac exam shows a regular sinus rhythm without murmurs or gallops. Lungs are clear to auscultation.Abdominal exam shows no hepatosplenomegaly, masses or tenderness. HbA1c 5.8        Assessment & Plan:  Type 2 diabetes mellitus without complication  Morbid obesity  Diverticulosis of large intestine without hemorrhage  Actinic keratoses  History of gout  Sleep apnea, obstructive  Other specified hypothyroidism  Hypertension associated with diabetes  S/P total hip arthroplasty  Hyperlipidemia associated with  type 2 diabetes mellitus I encouraged her to keep doing a good job with diet and exercise. She has a good feel for this needs to be a permanent lifestyle change. She has got rid of a lot of her close as well. Discussed getting a mammogram which she will set up. Also recommend she continue to check her blood sugars several times per week. I will do blood work with her next visit.

## 2014-08-26 NOTE — Addendum Note (Signed)
Addended by: Randel Books on: 08/26/2014 10:05 AM   Modules accepted: Orders

## 2014-08-26 NOTE — Patient Instructions (Addendum)
150 minutes a week of something physical. Your goal should be to continue to work on lifestyle change

## 2014-09-08 DIAGNOSIS — Z1231 Encounter for screening mammogram for malignant neoplasm of breast: Secondary | ICD-10-CM | POA: Diagnosis not present

## 2014-09-08 LAB — HM MAMMOGRAPHY: HM Mammogram: NEGATIVE

## 2014-09-09 ENCOUNTER — Encounter: Payer: Self-pay | Admitting: Internal Medicine

## 2014-09-10 DIAGNOSIS — M1711 Unilateral primary osteoarthritis, right knee: Secondary | ICD-10-CM | POA: Diagnosis not present

## 2014-10-08 DIAGNOSIS — M1711 Unilateral primary osteoarthritis, right knee: Secondary | ICD-10-CM | POA: Diagnosis not present

## 2014-10-17 ENCOUNTER — Telehealth: Payer: Self-pay

## 2014-10-17 DIAGNOSIS — M1711 Unilateral primary osteoarthritis, right knee: Secondary | ICD-10-CM | POA: Diagnosis not present

## 2014-10-17 NOTE — Telephone Encounter (Signed)
Pt wants to use [facility] for diabetic supplies. She says she has gotten her A1c down so she's not sure if she needs the supplies. Sending [facility] request form to Dr. Redmond School to decide if pt needs the supplies.

## 2014-10-17 NOTE — Telephone Encounter (Signed)
I will sign the order but she definitely needs the supplies.

## 2014-10-24 ENCOUNTER — Other Ambulatory Visit: Payer: Self-pay | Admitting: Family Medicine

## 2014-10-27 DIAGNOSIS — M1711 Unilateral primary osteoarthritis, right knee: Secondary | ICD-10-CM | POA: Diagnosis not present

## 2014-11-06 DIAGNOSIS — L821 Other seborrheic keratosis: Secondary | ICD-10-CM | POA: Diagnosis not present

## 2014-11-06 DIAGNOSIS — L570 Actinic keratosis: Secondary | ICD-10-CM | POA: Diagnosis not present

## 2014-12-02 ENCOUNTER — Other Ambulatory Visit: Payer: Self-pay | Admitting: Family Medicine

## 2014-12-04 DIAGNOSIS — M1711 Unilateral primary osteoarthritis, right knee: Secondary | ICD-10-CM | POA: Diagnosis not present

## 2014-12-25 ENCOUNTER — Ambulatory Visit: Payer: Medicare Other | Admitting: Family Medicine

## 2015-01-10 DIAGNOSIS — E119 Type 2 diabetes mellitus without complications: Secondary | ICD-10-CM | POA: Diagnosis not present

## 2015-01-10 DIAGNOSIS — S61211A Laceration without foreign body of left index finger without damage to nail, initial encounter: Secondary | ICD-10-CM | POA: Diagnosis not present

## 2015-01-10 DIAGNOSIS — Z87891 Personal history of nicotine dependence: Secondary | ICD-10-CM | POA: Diagnosis not present

## 2015-01-10 DIAGNOSIS — S61219A Laceration without foreign body of unspecified finger without damage to nail, initial encounter: Secondary | ICD-10-CM | POA: Diagnosis not present

## 2015-01-10 DIAGNOSIS — I10 Essential (primary) hypertension: Secondary | ICD-10-CM | POA: Diagnosis not present

## 2015-01-10 DIAGNOSIS — Z23 Encounter for immunization: Secondary | ICD-10-CM | POA: Diagnosis not present

## 2015-01-10 DIAGNOSIS — Z7982 Long term (current) use of aspirin: Secondary | ICD-10-CM | POA: Diagnosis not present

## 2015-01-18 ENCOUNTER — Other Ambulatory Visit: Payer: Self-pay | Admitting: Family Medicine

## 2015-01-21 ENCOUNTER — Ambulatory Visit: Payer: Medicare Other | Admitting: Family Medicine

## 2015-01-21 DIAGNOSIS — Z23 Encounter for immunization: Secondary | ICD-10-CM | POA: Diagnosis not present

## 2015-01-29 DIAGNOSIS — D485 Neoplasm of uncertain behavior of skin: Secondary | ICD-10-CM | POA: Diagnosis not present

## 2015-01-29 DIAGNOSIS — L570 Actinic keratosis: Secondary | ICD-10-CM | POA: Diagnosis not present

## 2015-01-29 DIAGNOSIS — L738 Other specified follicular disorders: Secondary | ICD-10-CM | POA: Diagnosis not present

## 2015-02-06 ENCOUNTER — Encounter: Payer: Self-pay | Admitting: Family Medicine

## 2015-03-03 ENCOUNTER — Other Ambulatory Visit: Payer: Self-pay | Admitting: Family Medicine

## 2015-03-05 DIAGNOSIS — M1711 Unilateral primary osteoarthritis, right knee: Secondary | ICD-10-CM | POA: Diagnosis not present

## 2015-03-18 DIAGNOSIS — H2513 Age-related nuclear cataract, bilateral: Secondary | ICD-10-CM | POA: Diagnosis not present

## 2015-03-18 DIAGNOSIS — E119 Type 2 diabetes mellitus without complications: Secondary | ICD-10-CM | POA: Diagnosis not present

## 2015-03-18 LAB — HM DIABETES EYE EXAM

## 2015-05-05 DIAGNOSIS — M1711 Unilateral primary osteoarthritis, right knee: Secondary | ICD-10-CM | POA: Diagnosis not present

## 2015-05-15 DIAGNOSIS — M1711 Unilateral primary osteoarthritis, right knee: Secondary | ICD-10-CM | POA: Diagnosis not present

## 2015-05-20 ENCOUNTER — Encounter: Payer: Self-pay | Admitting: *Deleted

## 2015-05-22 DIAGNOSIS — M1711 Unilateral primary osteoarthritis, right knee: Secondary | ICD-10-CM | POA: Diagnosis not present

## 2015-05-27 DIAGNOSIS — Z86018 Personal history of other benign neoplasm: Secondary | ICD-10-CM | POA: Diagnosis not present

## 2015-05-27 DIAGNOSIS — L570 Actinic keratosis: Secondary | ICD-10-CM | POA: Diagnosis not present

## 2015-05-27 DIAGNOSIS — D485 Neoplasm of uncertain behavior of skin: Secondary | ICD-10-CM | POA: Diagnosis not present

## 2015-05-27 DIAGNOSIS — Z23 Encounter for immunization: Secondary | ICD-10-CM | POA: Diagnosis not present

## 2015-05-27 DIAGNOSIS — Z85828 Personal history of other malignant neoplasm of skin: Secondary | ICD-10-CM | POA: Diagnosis not present

## 2015-05-27 DIAGNOSIS — L821 Other seborrheic keratosis: Secondary | ICD-10-CM | POA: Diagnosis not present

## 2015-06-04 ENCOUNTER — Other Ambulatory Visit: Payer: Self-pay | Admitting: Family Medicine

## 2015-06-05 ENCOUNTER — Other Ambulatory Visit: Payer: Self-pay | Admitting: Family Medicine

## 2015-06-09 DIAGNOSIS — L570 Actinic keratosis: Secondary | ICD-10-CM | POA: Diagnosis not present

## 2015-06-16 ENCOUNTER — Encounter: Payer: Self-pay | Admitting: Family Medicine

## 2015-08-12 DIAGNOSIS — L570 Actinic keratosis: Secondary | ICD-10-CM | POA: Diagnosis not present

## 2015-09-14 ENCOUNTER — Ambulatory Visit (INDEPENDENT_AMBULATORY_CARE_PROVIDER_SITE_OTHER): Payer: Medicare Other | Admitting: Family Medicine

## 2015-09-14 ENCOUNTER — Encounter: Payer: Self-pay | Admitting: Family Medicine

## 2015-09-14 VITALS — BP 120/80 | HR 78 | Ht 65.0 in | Wt 260.0 lb

## 2015-09-14 DIAGNOSIS — E1159 Type 2 diabetes mellitus with other circulatory complications: Secondary | ICD-10-CM

## 2015-09-14 DIAGNOSIS — Z8639 Personal history of other endocrine, nutritional and metabolic disease: Secondary | ICD-10-CM

## 2015-09-14 DIAGNOSIS — E1121 Type 2 diabetes mellitus with diabetic nephropathy: Secondary | ICD-10-CM | POA: Diagnosis not present

## 2015-09-14 DIAGNOSIS — Z9660 Presence of unspecified orthopedic joint implant: Secondary | ICD-10-CM

## 2015-09-14 DIAGNOSIS — I10 Essential (primary) hypertension: Secondary | ICD-10-CM

## 2015-09-14 DIAGNOSIS — E1169 Type 2 diabetes mellitus with other specified complication: Secondary | ICD-10-CM

## 2015-09-14 DIAGNOSIS — E039 Hypothyroidism, unspecified: Secondary | ICD-10-CM

## 2015-09-14 DIAGNOSIS — I152 Hypertension secondary to endocrine disorders: Secondary | ICD-10-CM

## 2015-09-14 DIAGNOSIS — G4733 Obstructive sleep apnea (adult) (pediatric): Secondary | ICD-10-CM

## 2015-09-14 DIAGNOSIS — Z1159 Encounter for screening for other viral diseases: Secondary | ICD-10-CM | POA: Diagnosis not present

## 2015-09-14 DIAGNOSIS — E785 Hyperlipidemia, unspecified: Secondary | ICD-10-CM | POA: Diagnosis not present

## 2015-09-14 DIAGNOSIS — E119 Type 2 diabetes mellitus without complications: Secondary | ICD-10-CM | POA: Diagnosis not present

## 2015-09-14 DIAGNOSIS — E11618 Type 2 diabetes mellitus with other diabetic arthropathy: Secondary | ICD-10-CM | POA: Diagnosis not present

## 2015-09-14 DIAGNOSIS — Z8739 Personal history of other diseases of the musculoskeletal system and connective tissue: Secondary | ICD-10-CM

## 2015-09-14 DIAGNOSIS — Z96649 Presence of unspecified artificial hip joint: Secondary | ICD-10-CM

## 2015-09-14 LAB — POCT GLYCOSYLATED HEMOGLOBIN (HGB A1C): Hemoglobin A1C: 6

## 2015-09-14 MED ORDER — LISINOPRIL-HYDROCHLOROTHIAZIDE 20-12.5 MG PO TABS: 1.0000 | ORAL_TABLET | Freq: Every day | ORAL | Status: DC

## 2015-09-14 MED ORDER — ALLOPURINOL 300 MG PO TABS: 300.0000 mg | ORAL_TABLET | Freq: Every day | ORAL | Status: DC

## 2015-09-14 MED ORDER — ATORVASTATIN CALCIUM 10 MG PO TABS: 10.0000 mg | ORAL_TABLET | Freq: Every day | ORAL | Status: DC

## 2015-09-14 MED ORDER — LEVOTHYROXINE SODIUM 88 MCG PO TABS: 88.0000 ug | ORAL_TABLET | Freq: Every day | ORAL | Status: DC

## 2015-09-14 NOTE — Patient Instructions (Signed)
20 minutes daily of something physical.. Walk, jog, ride a bike, swim. You can do 2-10 minute walks

## 2015-09-14 NOTE — Progress Notes (Deleted)
   Subjective:    Patient ID: Carla Wright, female    DOB: 1945/06/28, 70 y.o.   MRN: [id number]  HPI She is here for diabetes recheck. He admits to dietary as well as exercise indiscretion. He would like refills on her medications. She does have difficulty with knee pain. She has had previous hip replacements. She does recognize the need to increase her physical activities help with her knee pain.   Review of Systems     Objective:   Physical Exam        Assessment & Plan:  Type 2 diabetes mellitus without complication, without long-term current use of insulin ([place]) - Plan: POCT glycosylated hemoglobin (Hb A1C), CBC with Differential/Platelet, Comprehensive metabolic panel, Lipid panel, Microalbumin/Creatinine Ratio, Urine, CANCELED: POCT UA - Microalbumin  Morbid obesity due to excess calories ([facility])  History of gout - Plan: Uric Acid, allopurinol (ZYLOPRIM) 300 MG tablet  Hypothyroidism, unspecified hypothyroidism type - Plan: TSH, levothyroxine (SYNTHROID, LEVOTHROID) 88 MCG tablet  Hypertension associated with diabetes ([place]) - Plan: CBC with Differential/Platelet, Comprehensive metabolic panel, lisinopril-hydrochlorothiazide (PRINZIDE,ZESTORETIC) 20-12.5 MG tablet  S/P total hip arthroplasty  Hyperlipidemia associated with type 2 diabetes mellitus ([facility]) - Plan: Lipid panel, atorvastatin (LIPITOR) 10 MG tablet  Need for hepatitis C screening test - Plan: Hepatitis C antibody  Sleep apnea, obstructive  Arthritis associated with diabetes ([place])

## 2015-09-14 NOTE — Progress Notes (Signed)
Subjective:    Patient ID: Carla Wright, female    DOB: 29-Jul-1945, 70 y.o.   MRN: [id number]  Carla Wright is a 70 y.o. female who presents for follow-up of Type 2 diabetes mellitus.  Patient is checking home blood sugars.   Home blood sugar records: 123 to103 How often is blood sugars being checked: once a day Current symptoms/problem none Daily foot checks: yes  Any foot concerns: none Last eye exam: 1/17 Exercise: none.She blames knee pain for this but then mentions that walking does not cause any pain. She has had both hips replaced and has seen Dr. Wynelle Link for follow-up on her knees. She apparently has had at least one injection. She continues on lisinopril as well as atorvastatin. She is on multiple over-the-counter medications. She also takes allopurinol for her gout but has not had any gout related symptoms. She continues on Synthroid and is again having no skin or hair changes, hot or cold intolerance.She does have a history of OSA but at present time is not using a machine nor does she want to get back on it. She states it interferes with her sleep. The following portions of the patient's history were reviewed and updated as appropriate: allergies, current medications, past medical history, past social history and problem list.  ROS as in subjective above.     Objective:    Physical Exam Alert and in no distress. Tympanic membranes and canals are normal. Pharyngeal area is normal. Neck is supple without adenopathy or thyromegaly. Cardiac exam shows a regular sinus rhythm without murmurs or gallops. Lungs are clear to auscultation.  Lab Review Diabetic Labs Latest Ref Rng 08/26/2014 01/13/2014 09/09/2013 05/06/2013 03/07/2013  HbA1c - 5.8 5.9 6.2 6.1 -  Chol 0 - 200 mg/dL - 113 115 - 178  HDL >39 mg/dL - 48 40 - 47  Calc LDL 0 - 99 mg/dL - 50 56 - 101(H)  Triglycerides <150 mg/dL - 73 96 - 148  Creatinine 0.50 - 1.10 mg/dL - 0.96 - - -   BP/Weight 08/26/2014 05/28/2014 01/13/2014  A999333 [id number]  Systolic BP 123XX123 A999333 [id number] A999333  Diastolic BP 82 60 80 80 72  Wt. (Lbs) 239 248 255 266 283  BMI 39.77 41.27 42.43 44.26 47.09   Foot/eye exam completion dates Latest Ref Rng 03/18/2015 03/10/2014  Eye Exam No Retinopathy No Retinopathy No Retinopathy  Foot Form Completion - - -  A1c 6.0  Carla Wright  reports that she quit smoking about 34 years ago. She has never used smokeless tobacco. She reports that she drinks alcohol. She reports that she does not use illicit drugs.     Assessment & Plan:    Type 2 diabetes mellitus without complication, without long-term current use of insulin (HCC) - Plan: POCT glycosylated hemoglobin (Hb A1C), CBC with Differential/Platelet, Comprehensive metabolic panel, Lipid panel, Microalbumin/Creatinine Ratio, Urine, CANCELED: POCT UA - Microalbumin  Morbid obesity due to excess calories (HCC)  History of gout - Plan: Uric Acid, allopurinol (ZYLOPRIM) 300 MG tablet  Hypothyroidism, unspecified hypothyroidism type - Plan: TSH, levothyroxine (SYNTHROID, LEVOTHROID) 88 MCG tablet  Hypertension associated with diabetes ([place]) - Plan: CBC with Differential/Platelet, Comprehensive metabolic panel, lisinopril-hydrochlorothiazide (PRINZIDE,ZESTORETIC) 20-12.5 MG tablet  S/P total hip arthroplasty  Hyperlipidemia associated with type 2 diabetes mellitus ([place]) - Plan: Lipid panel, atorvastatin (LIPITOR) 10 MG tablet  Need for hepatitis C screening test - Plan: Hepatitis C antibody  Sleep apnea, obstructive  Arthritis associated with diabetes ([place])  1. Rx changes: none 2. Education: Reviewed 'ABCs' of diabetes management (respective goals in parentheses):  A1C (<7), blood pressure (<130/80), and cholesterol (LDL <100). 3. Compliance at present is estimated to be fair. Efforts to improve compliance (if necessary) will be directed at increased exercise. 4. Follow up: 4 months Stressed the need for her to become more physically active even in short  increments. I will renew her medications. Ex plus the fact that weight reduction will help also with her knee pain to minimize the need for joint replacement.

## 2015-09-15 LAB — LIPID PANEL
Cholesterol: 153 mg/dL (ref 125–200)
HDL: 56 mg/dL (ref >=46)
LDL Cholesterol: 76 mg/dL (ref <130)
Total CHOL/HDL Ratio: 2.7 Ratio (ref <=5.0)
Triglycerides: 105 mg/dL (ref <150)
VLDL: 21 mg/dL (ref <30)

## 2015-09-15 LAB — CBC WITH DIFFERENTIAL/PLATELET
Basophils Absolute: 0 cells/uL (ref 0–200)
Basophils Relative: 0 %
Eosinophils Absolute: 176 cells/uL (ref 15–500)
Eosinophils Relative: 2 %
HCT: 43.2 % (ref 35.0–45.0)
Hemoglobin: 14.4 g/dL (ref 11.7–15.5)
Lymphocytes Relative: 25 %
Lymphs Abs: 2200 cells/uL (ref 850–3900)
MCH: 30.7 pg (ref 27.0–33.0)
MCHC: 33.3 g/dL (ref 32.0–36.0)
MCV: 92.1 fL (ref 80.0–100.0)
MPV: 9.7 fL (ref 7.5–12.5)
Monocytes Absolute: 616 cells/uL (ref 200–950)
Monocytes Relative: 7 %
Neutro Abs: 5808 cells/uL (ref 1500–7800)
Neutrophils Relative %: 66 %
Platelets: 230 10*3/uL (ref 140–400)
RBC: 4.69 MIL/uL (ref 3.80–5.10)
RDW: 14.4 % (ref 11.0–15.0)
WBC: 8.8 10*3/uL (ref 4.0–10.5)

## 2015-09-15 LAB — COMPREHENSIVE METABOLIC PANEL
ALT: 11 U/L (ref 6–29)
AST: 14 U/L (ref 10–35)
Albumin: 4 g/dL (ref 3.6–5.1)
Alkaline Phosphatase: 79 U/L (ref 33–130)
BUN: 21 mg/dL (ref 7–25)
CO2: 25 mmol/L (ref 20–31)
Calcium: 10.1 mg/dL (ref 8.6–10.4)
Chloride: 102 mmol/L (ref 98–110)
Creat: 1.08 mg/dL — ABNORMAL HIGH (ref 0.50–0.99)
Glucose, Bld: 103 mg/dL — ABNORMAL HIGH (ref 65–99)
Potassium: 4 mmol/L (ref 3.5–5.3)
Sodium: 140 mmol/L (ref 135–146)
Total Bilirubin: 0.6 mg/dL (ref 0.2–1.2)
Total Protein: 6.8 g/dL (ref 6.1–8.1)

## 2015-09-15 LAB — MICROALBUMIN / CREATININE URINE RATIO
Creatinine, Urine: 72 mg/dL (ref 20–320)
Microalb Creat Ratio: 97 mcg/mg creat — ABNORMAL HIGH (ref <30)
Microalb, Ur: 7 mg/dL

## 2015-09-15 LAB — URIC ACID: Uric Acid, Serum: 5.1 mg/dL (ref 2.5–7.0)

## 2015-09-15 LAB — HEPATITIS C ANTIBODY: HCV Ab: NEGATIVE

## 2015-09-15 LAB — TSH: TSH: 3.05 mIU/L

## 2015-09-15 NOTE — Progress Notes (Signed)
   Subjective:    Patient ID: Carla Wright, female    DOB: [birth year], 70 y.o.   MRN: [id number]  HPI    Review of Systems     Objective:   Physical Exam        Assessment & Plan:  The microalbumin ratio was positive. She now has evidence of nephropathy.

## 2015-10-21 DIAGNOSIS — L570 Actinic keratosis: Secondary | ICD-10-CM | POA: Diagnosis not present

## 2015-10-21 DIAGNOSIS — L219 Seborrheic dermatitis, unspecified: Secondary | ICD-10-CM | POA: Diagnosis not present

## 2015-10-21 DIAGNOSIS — L821 Other seborrheic keratosis: Secondary | ICD-10-CM | POA: Diagnosis not present

## 2015-10-30 ENCOUNTER — Other Ambulatory Visit: Payer: Self-pay

## 2016-01-18 ENCOUNTER — Ambulatory Visit: Payer: Medicare Other | Admitting: Family Medicine

## 2016-03-03 DIAGNOSIS — M1712 Unilateral primary osteoarthritis, left knee: Secondary | ICD-10-CM | POA: Diagnosis not present

## 2016-03-03 DIAGNOSIS — M170 Bilateral primary osteoarthritis of knee: Secondary | ICD-10-CM | POA: Diagnosis not present

## 2016-03-03 DIAGNOSIS — M1711 Unilateral primary osteoarthritis, right knee: Secondary | ICD-10-CM | POA: Diagnosis not present

## 2016-03-08 DIAGNOSIS — Z23 Encounter for immunization: Secondary | ICD-10-CM | POA: Diagnosis not present

## 2016-03-10 DIAGNOSIS — M170 Bilateral primary osteoarthritis of knee: Secondary | ICD-10-CM | POA: Diagnosis not present

## 2016-03-22 ENCOUNTER — Ambulatory Visit: Payer: Medicare Other | Admitting: Family Medicine

## 2016-03-22 DIAGNOSIS — E119 Type 2 diabetes mellitus without complications: Secondary | ICD-10-CM | POA: Diagnosis not present

## 2016-03-22 DIAGNOSIS — H2513 Age-related nuclear cataract, bilateral: Secondary | ICD-10-CM | POA: Diagnosis not present

## 2016-03-22 LAB — HM DIABETES EYE EXAM

## 2016-03-24 ENCOUNTER — Encounter: Payer: Self-pay | Admitting: Family Medicine

## 2016-03-24 DIAGNOSIS — M170 Bilateral primary osteoarthritis of knee: Secondary | ICD-10-CM | POA: Diagnosis not present

## 2016-06-02 DIAGNOSIS — L821 Other seborrheic keratosis: Secondary | ICD-10-CM | POA: Diagnosis not present

## 2016-06-02 DIAGNOSIS — Z86018 Personal history of other benign neoplasm: Secondary | ICD-10-CM | POA: Diagnosis not present

## 2016-06-02 DIAGNOSIS — L814 Other melanin hyperpigmentation: Secondary | ICD-10-CM | POA: Diagnosis not present

## 2016-06-02 DIAGNOSIS — Z85828 Personal history of other malignant neoplasm of skin: Secondary | ICD-10-CM | POA: Diagnosis not present

## 2016-06-02 DIAGNOSIS — L570 Actinic keratosis: Secondary | ICD-10-CM | POA: Diagnosis not present

## 2016-06-02 DIAGNOSIS — D1801 Hemangioma of skin and subcutaneous tissue: Secondary | ICD-10-CM | POA: Diagnosis not present

## 2016-06-02 DIAGNOSIS — D225 Melanocytic nevi of trunk: Secondary | ICD-10-CM | POA: Diagnosis not present

## 2016-06-09 ENCOUNTER — Telehealth: Payer: Self-pay | Admitting: Family Medicine

## 2016-06-09 NOTE — Telephone Encounter (Signed)
Called and left message that we would like to set up a medicare well visit with her told her to call back

## 2016-07-12 ENCOUNTER — Telehealth: Payer: Self-pay | Admitting: Family Medicine

## 2016-07-12 NOTE — Telephone Encounter (Signed)
Called and left message that we need to get her scheduled for a medicare well visit with dr Redmond School

## 2016-07-14 ENCOUNTER — Telehealth: Payer: Self-pay | Admitting: Family Medicine

## 2016-07-14 NOTE — Telephone Encounter (Signed)
Called pt 3 times to see if we could get pt in for a medicare well visit with dr Redmond School. Left message with pt

## 2016-09-07 ENCOUNTER — Other Ambulatory Visit: Payer: Self-pay | Admitting: Family Medicine

## 2016-09-07 DIAGNOSIS — E1159 Type 2 diabetes mellitus with other circulatory complications: Secondary | ICD-10-CM

## 2016-09-07 DIAGNOSIS — E1169 Type 2 diabetes mellitus with other specified complication: Secondary | ICD-10-CM

## 2016-09-07 DIAGNOSIS — I10 Essential (primary) hypertension: Principal | ICD-10-CM

## 2016-09-07 DIAGNOSIS — E785 Hyperlipidemia, unspecified: Secondary | ICD-10-CM

## 2016-10-17 ENCOUNTER — Other Ambulatory Visit: Payer: Self-pay

## 2016-10-17 ENCOUNTER — Other Ambulatory Visit: Payer: Self-pay | Admitting: Family Medicine

## 2016-10-17 DIAGNOSIS — E039 Hypothyroidism, unspecified: Secondary | ICD-10-CM

## 2016-10-17 DIAGNOSIS — Z8739 Personal history of other diseases of the musculoskeletal system and connective tissue: Secondary | ICD-10-CM

## 2016-11-01 ENCOUNTER — Encounter: Payer: Self-pay | Admitting: Family Medicine

## 2016-11-01 ENCOUNTER — Ambulatory Visit (INDEPENDENT_AMBULATORY_CARE_PROVIDER_SITE_OTHER): Payer: Medicare Other | Admitting: Family Medicine

## 2016-11-01 VITALS — BP 128/76 | HR 100 | Ht 65.0 in | Wt 264.2 lb

## 2016-11-01 DIAGNOSIS — E039 Hypothyroidism, unspecified: Secondary | ICD-10-CM

## 2016-11-01 DIAGNOSIS — E1169 Type 2 diabetes mellitus with other specified complication: Secondary | ICD-10-CM | POA: Diagnosis not present

## 2016-11-01 DIAGNOSIS — E1159 Type 2 diabetes mellitus with other circulatory complications: Secondary | ICD-10-CM

## 2016-11-01 DIAGNOSIS — Z9119 Patient's noncompliance with other medical treatment and regimen: Secondary | ICD-10-CM | POA: Diagnosis not present

## 2016-11-01 DIAGNOSIS — I10 Essential (primary) hypertension: Secondary | ICD-10-CM | POA: Diagnosis not present

## 2016-11-01 DIAGNOSIS — I152 Hypertension secondary to endocrine disorders: Secondary | ICD-10-CM

## 2016-11-01 DIAGNOSIS — Z8739 Personal history of other diseases of the musculoskeletal system and connective tissue: Secondary | ICD-10-CM | POA: Diagnosis not present

## 2016-11-01 DIAGNOSIS — Z23 Encounter for immunization: Secondary | ICD-10-CM | POA: Diagnosis not present

## 2016-11-01 DIAGNOSIS — E785 Hyperlipidemia, unspecified: Secondary | ICD-10-CM

## 2016-11-01 DIAGNOSIS — Z91199 Patient's noncompliance with other medical treatment and regimen due to unspecified reason: Secondary | ICD-10-CM

## 2016-11-01 DIAGNOSIS — E119 Type 2 diabetes mellitus without complications: Secondary | ICD-10-CM | POA: Diagnosis not present

## 2016-11-01 DIAGNOSIS — G4733 Obstructive sleep apnea (adult) (pediatric): Secondary | ICD-10-CM

## 2016-11-01 LAB — CBC WITH DIFFERENTIAL/PLATELET
Basophils Absolute: 0 cells/uL (ref 0–200)
Basophils Relative: 0 %
Eosinophils Absolute: 75 cells/uL (ref 15–500)
Eosinophils Relative: 1 %
HCT: 43.8 % (ref 35.0–45.0)
Hemoglobin: 14.6 g/dL (ref 11.7–15.5)
Lymphocytes Relative: 22 %
Lymphs Abs: 1650 cells/uL (ref 850–3900)
MCH: 31.5 pg (ref 27.0–33.0)
MCHC: 33.3 g/dL (ref 32.0–36.0)
MCV: 94.6 fL (ref 80.0–100.0)
MPV: 9.7 fL (ref 7.5–12.5)
Monocytes Absolute: 450 cells/uL (ref 200–950)
Monocytes Relative: 6 %
Neutro Abs: 5325 cells/uL (ref 1500–7800)
Neutrophils Relative %: 71 %
Platelets: 228 10*3/uL (ref 140–400)
RBC: 4.63 MIL/uL (ref 3.80–5.10)
RDW: 14.3 % (ref 11.0–15.0)
WBC: 7.5 10*3/uL (ref 4.0–10.5)

## 2016-11-01 LAB — POCT UA - MICROALBUMIN
Albumin/Creatinine Ratio, Urine, POC: 25.9
Creatinine, POC: 64.8 mg/dL
Microalbumin Ur, POC: 16.8 mg/L

## 2016-11-01 LAB — POCT GLYCOSYLATED HEMOGLOBIN (HGB A1C): Hemoglobin A1C: 7

## 2016-11-01 MED ORDER — LISINOPRIL-HYDROCHLOROTHIAZIDE 20-12.5 MG PO TABS: 1.0000 | ORAL_TABLET | Freq: Every day | ORAL | 1 refills | Status: DC

## 2016-11-01 MED ORDER — ATORVASTATIN CALCIUM 10 MG PO TABS: 10.0000 mg | ORAL_TABLET | Freq: Every day | ORAL | 3 refills | Status: DC

## 2016-11-01 MED ORDER — LEVOTHYROXINE SODIUM 88 MCG PO TABS: 88.0000 ug | ORAL_TABLET | Freq: Every day | ORAL | 3 refills | Status: DC

## 2016-11-01 MED ORDER — ALLOPURINOL 300 MG PO TABS: 300.0000 mg | ORAL_TABLET | Freq: Every day | ORAL | 3 refills | Status: DC

## 2016-11-01 NOTE — Patient Instructions (Addendum)
Something physical for 20 minutes every day. Just recognize if you make an excuse you are saying its more important than your health You need to discriminate against "white food". [place] portions in half Current Outpatient Prescriptions on File Prior to Visit  Medication Sig Dispense Refill  . allopurinol (ZYLOPRIM) 300 MG tablet TAKE ONE TABLET BY MOUTH DAILY 90 tablet 0  . aspirin 81 MG tablet Take 81 mg by mouth daily.    Marland Kitchen atorvastatin (LIPITOR) 10 MG tablet TAKE ONE TABLET BY MOUTH DAILY 90 tablet 0  . BIOTIN PO Take by mouth.    . levothyroxine (SYNTHROID, LEVOTHROID) 88 MCG tablet TAKE ONE TABLET BY MOUTH DAILY 90 tablet 0  . lisinopril-hydrochlorothiazide (PRINZIDE,ZESTORETIC) 20-12.5 MG tablet TAKE ONE TABLET BY MOUTH DAILY 90 tablet 0  . Multiple Vitamin (MULTIVITAMIN WITH MINERALS) TABS Take 1 tablet by mouth daily.    Marland Kitchen co-enzyme Q-10 30 MG capsule Take 30 mg by mouth 3 (three) times daily.     No current facility-administered medications on file prior to visit.

## 2016-11-01 NOTE — Progress Notes (Signed)
Carla Wright is a 71 y.o. female who presents for annual wellness visit and follow-up on chronic medical conditions.  She has the following concerns: She admits to being noncompliant with her diet and exercise and general diabetes care. She rarely checks her blood sugars or her feet. She has made very few dietary changes and is not involved in any, regular physical activities. She does continue on medications listed in the chart including her thyroid. She does have underlying OSA but is not using CPAP stating she cannot tolerate it. She has had both hips replaced and is apparently getting injections for her knees. She does have a history of gout but has not had a flare in quite some time. She has no other concerns or complaints. Family and social history as well as health maintenance listed below  Immunizations and Health Maintenance Immunization History  Administered Date(s) Administered  . Influenza Split 11/28/2011  . Influenza, High Dose Seasonal PF 01/23/2013, 01/13/2014  . Pneumococcal Conjugate-13 09/09/2013  . Pneumococcal Polysaccharide-23 03/20/2009  . Tdap 02/20/2007  . Zoster 02/20/2007   Health Maintenance Due  Topic Date Due  . FOOT EXAM  05/02/1956  . PNA vac Low Risk Adult (2 of 2 - PPSV23) 09/10/2014  . HEMOGLOBIN A1C  03/16/2016  . MAMMOGRAM  09/07/2016  . INFLUENZA VACCINE  09/28/2016    Last Pap smear:unknown  Last mammogram:04/27/12.Patient will set up getting a mammogram Last colonoscopy:2010 Last DEXA:06/20/2012 [facility]ist:[facility]  [place] Exercise:no   Other doctors caring for patient include: Dr. Caryn Section -[place]. Dr Maureen Ralphs. Orthopedic  Advanced directives:yes  a copy asked for  Depression screen:  See questionnaire below.  Depression screen [facility] 2/9 11/01/2016 08/26/2014 03/27/2013 01/23/2013 09/16/2011  Decreased Interest 0 0 0 0 0  Down, Depressed, Hopeless 0 0 0 0 0  PHQ - 2 Score 0 0 0 0 0    Fall Risk Screen: see questionnaire  below. Fall Risk  11/01/2016 10/30/2015 08/26/2014 03/27/2013 01/23/2013  Falls in the past year? No No No No No  Comment - [place] Telephone Survey: data to providers prior to load - - -  Risk for fall due to : - - - - -    ADL screen:  See questionnaire below Functional Status Survey: Is the patient deaf or have difficulty hearing?: No Does the patient have difficulty seeing, even when wearing glasses/contacts?: Yes Does the patient have difficulty concentrating, remembering, or making decisions?: No Does the patient have difficulty walking or climbing stairs?: No Does the patient have difficulty dressing or bathing?: No Does the patient have difficulty doing errands alone such as visiting a doctor's office or shopping?: No   Review of Systems  negative except as above    PHYSICAL EXAM:  General Appearance: Alert, cooperative, no distress, appears stated age Head: Normocephalic, without obvious abnormality, atraumatic Eyes: PERRL, conjunctiva/corneas clear, EOM's intact, fundi benign Ears: Normal TM's and external ear canals Nose: Nares normal, mucosa normal, no drainage or sinus tenderness Throat: Lips, mucosa, and tongue normal; teeth and gums normal Neck: Supple, no lymphadenopathy;  thyroid:  no enlargement/tenderness/nodules; no carotid bruit or JVD Lungs: Clear to auscultation bilaterally without wheezes, rales or ronchi; respirations unlabored Heart: Regular rate and rhythm, S1 and S2 normal, no murmur, rubor gallop Abdomen: Soft, non-tender, nondistended, normoactive bowel sounds,  no masses, no hepatosplenomegaly Extremities: No clubbing, cyanosis or edema Pulses: 2+ and symmetric all extremities Skin:  Skin color, texture, turgor normal, no rashes or lesions Lymph nodes: Cervical, supraclavicular, and  axillary nodes normal Neurologic:  CNII-XII intact, normal strength, sensation and gait; reflexes 2+ and symmetric throughout Psych: Normal mood, affect, hygiene and  grooming. Hemoglobin A1c is 7.0. ASSESSMENT/PLAN: Type 2 diabetes mellitus without complication, without long-term current use of insulin (HCC) - Plan: CBC with Differential/Platelet, Comprehensive metabolic panel, Lipid panel, POCT UA - Microalbumin, HgB A1c  Need for influenza vaccination - Plan: Flu vaccine HIGH DOSE PF (Fluzone High dose)  Sleep apnea, obstructive  Morbid obesity (HCC)  Hypertension associated with diabetes ([place])  Hyperlipidemia associated with type 2 diabetes mellitus ([place]) - Plan: CBC with Differential/Platelet, Comprehensive metabolic panel, Lipid panel  Hypothyroidism, unspecified type  History of gout  Personal history of noncompliance with medical treatment, presenting hazards to health  Need for prophylactic vaccination and inoculation against influenza  I discussed the fact that she is noncompliant with her overall care in regard to diet, exercise, foot care, checking her blood sugars regularly. Strongly encouraged her to make further dietary and exercise changes to help both with her diabetes as well as her underlying arthritis and OSA. Discussed benefits to her knees concerning weight loss.  Medicare Attestation I have personally reviewed: The patient's medical and social history Their use of alcohol, tobacco or illicit drugs Their current medications and supplements The patient's functional ability including ADLs,fall risks, home safety risks, cognitive, and hearing and visual impairment Diet and physical activities Evidence for depression or mood disorders  The patient's weight, height, and BMI have been recorded in the chart.  I have made referrals, counseling, and provided education to the patient based on review of the above and I have provided the patient with a written personalized care plan for preventive services.     Wyatt Haste, MD   11/01/2016

## 2016-11-02 LAB — COMPREHENSIVE METABOLIC PANEL
ALT: 15 U/L (ref 6–29)
AST: 15 U/L (ref 10–35)
Albumin: 4.2 g/dL (ref 3.6–5.1)
Alkaline Phosphatase: 83 U/L (ref 33–130)
BUN: 22 mg/dL (ref 7–25)
CO2: 22 mmol/L (ref 20–32)
Calcium: 10.7 mg/dL — ABNORMAL HIGH (ref 8.6–10.4)
Chloride: 100 mmol/L (ref 98–110)
Creat: 0.96 mg/dL — ABNORMAL HIGH (ref 0.60–0.93)
Glucose, Bld: 145 mg/dL — ABNORMAL HIGH (ref 65–99)
Potassium: 3.8 mmol/L (ref 3.5–5.3)
Sodium: 137 mmol/L (ref 135–146)
Total Bilirubin: 0.6 mg/dL (ref 0.2–1.2)
Total Protein: 6.9 g/dL (ref 6.1–8.1)

## 2016-11-02 LAB — LIPID PANEL
Cholesterol: 142 mg/dL (ref <200)
HDL: 48 mg/dL — ABNORMAL LOW (ref >50)
LDL Cholesterol: 74 mg/dL (ref <100)
Total CHOL/HDL Ratio: 3 Ratio (ref <5.0)
Triglycerides: 102 mg/dL (ref <150)
VLDL: 20 mg/dL (ref <30)

## 2016-11-02 LAB — TSH: TSH: 2.74 mIU/L

## 2016-11-02 LAB — URIC ACID: Uric Acid, Serum: 5 mg/dL (ref 2.5–7.0)

## 2016-11-07 DIAGNOSIS — L570 Actinic keratosis: Secondary | ICD-10-CM | POA: Diagnosis not present

## 2016-11-07 DIAGNOSIS — Z23 Encounter for immunization: Secondary | ICD-10-CM | POA: Diagnosis not present

## 2016-11-21 DIAGNOSIS — Z1231 Encounter for screening mammogram for malignant neoplasm of breast: Secondary | ICD-10-CM | POA: Diagnosis not present

## 2016-11-21 DIAGNOSIS — M8589 Other specified disorders of bone density and structure, multiple sites: Secondary | ICD-10-CM | POA: Diagnosis not present

## 2016-11-21 LAB — HM DEXA SCAN

## 2016-11-21 LAB — HM MAMMOGRAPHY

## 2016-11-25 ENCOUNTER — Encounter: Payer: Self-pay | Admitting: *Deleted

## 2016-11-28 ENCOUNTER — Ambulatory Visit (INDEPENDENT_AMBULATORY_CARE_PROVIDER_SITE_OTHER): Payer: Medicare Other | Admitting: Family Medicine

## 2016-11-28 ENCOUNTER — Encounter: Payer: Self-pay | Admitting: Family Medicine

## 2016-11-28 VITALS — BP 140/80 | HR 79 | Resp 16 | Wt 265.6 lb

## 2016-11-28 DIAGNOSIS — M810 Age-related osteoporosis without current pathological fracture: Secondary | ICD-10-CM | POA: Insufficient documentation

## 2016-11-28 NOTE — Progress Notes (Signed)
   Subjective:    Patient ID: Carla Wright, female    DOB: [birth year], 71 y.o.   MRN: [id number]  HPI She is here for consult concerning recent DEXA scan showed osteoporosis. The previous scan in 2013 was normal. Review of her lab data shows her calcium is slightly above the normal range order she is having no symptoms from that. She does not exercise regularly.   Review of Systems     Objective:   Physical Exam Alert and in no distress otherwise not examined       Assessment & Plan:  Age-related osteoporosis without current pathological fracture I discussed various options with her concerning treatment of this including bisphosphonate versus Prolia. She would like to be placed on Prolia paperwork started concerning this. Right and exercise with her strongly encouraging her to become more physically active, recommending 20 minutes of something physical daily.

## 2016-12-07 ENCOUNTER — Telehealth: Payer: Self-pay | Admitting: Family Medicine

## 2016-12-07 NOTE — Telephone Encounter (Signed)
[facility] requesting this pt be considered for Prolia. All info was submitted and Eligibility Check results received. Pt was approved by both insurances. Estimated out of pocket cost will be $0 as long as deductible has been met and pt verified that she has met that deductible.  Pt scheduled an appt for 12/14/2016 and medication was ordered from [place]. Pt was informed to call the office before she leaves her home so we can set medication out so it will reached room temp. Pt verified that she would.  This info is being forward to [place] so he will be aware that pt was approved and coming in.

## 2016-12-14 ENCOUNTER — Other Ambulatory Visit (INDEPENDENT_AMBULATORY_CARE_PROVIDER_SITE_OTHER): Payer: Medicare Other

## 2016-12-14 DIAGNOSIS — M810 Age-related osteoporosis without current pathological fracture: Secondary | ICD-10-CM

## 2016-12-14 MED ORDER — DENOSUMAB 60 MG/ML ~~LOC~~ SOLN
60.0000 mg | Freq: Once | SUBCUTANEOUS | Status: AC
  Administered 2016-12-14: 60 mg via SUBCUTANEOUS

## 2017-02-01 DIAGNOSIS — M170 Bilateral primary osteoarthritis of knee: Secondary | ICD-10-CM | POA: Diagnosis not present

## 2017-02-08 DIAGNOSIS — M170 Bilateral primary osteoarthritis of knee: Secondary | ICD-10-CM | POA: Diagnosis not present

## 2017-02-15 DIAGNOSIS — M170 Bilateral primary osteoarthritis of knee: Secondary | ICD-10-CM | POA: Diagnosis not present

## 2017-03-07 ENCOUNTER — Encounter: Payer: Medicare Other | Admitting: Family Medicine

## 2017-04-13 ENCOUNTER — Telehealth: Payer: Self-pay | Admitting: Family Medicine

## 2017-04-13 NOTE — Telephone Encounter (Signed)
Pharmacy called and said that it will be filled for pt. Old brand will no longer be carried . Pt has a appt and will note it that she will need a two month follow up because of brand med change. Thanks [employer]

## 2017-04-13 NOTE — Telephone Encounter (Signed)
A new Pharmacy called to inquire also.  [facility] has discontinued this line and will not longer be available.  [place]. Pharmacy can be reached at [phone]. PER PHARMACY THEY DID RECEIVED A TRANSFER FROM [facility] THIS MORNING.

## 2017-04-13 NOTE — Telephone Encounter (Signed)
Okay to use the Carla Wright product but she will need to come here for a recheck in 2 months

## 2017-04-13 NOTE — Telephone Encounter (Signed)
[facility] advised Levothroxin on backorder .  [facility] has Mylan [facility]

## 2017-04-17 ENCOUNTER — Encounter: Payer: Medicare Other | Admitting: Family Medicine

## 2017-05-03 ENCOUNTER — Encounter: Payer: Medicare Other | Admitting: Family Medicine

## 2017-05-15 ENCOUNTER — Encounter: Payer: Medicare Other | Admitting: Family Medicine

## 2017-05-29 ENCOUNTER — Telehealth: Payer: Self-pay | Admitting: Family Medicine

## 2017-05-29 NOTE — Telephone Encounter (Signed)
Left message for pt to call. Needs to schedule next prolia injection. $0 out of pocket estimated cost. PLEASE ADVISE KAT SO MEDICATION CAN BE ORDERED.

## 2017-06-05 ENCOUNTER — Telehealth: Payer: Self-pay | Admitting: Family Medicine

## 2017-06-05 ENCOUNTER — Other Ambulatory Visit: Payer: Self-pay | Admitting: Family Medicine

## 2017-06-05 DIAGNOSIS — D1801 Hemangioma of skin and subcutaneous tissue: Secondary | ICD-10-CM | POA: Diagnosis not present

## 2017-06-05 DIAGNOSIS — L814 Other melanin hyperpigmentation: Secondary | ICD-10-CM | POA: Diagnosis not present

## 2017-06-05 DIAGNOSIS — Z86018 Personal history of other benign neoplasm: Secondary | ICD-10-CM | POA: Diagnosis not present

## 2017-06-05 DIAGNOSIS — D225 Melanocytic nevi of trunk: Secondary | ICD-10-CM | POA: Diagnosis not present

## 2017-06-05 DIAGNOSIS — L821 Other seborrheic keratosis: Secondary | ICD-10-CM | POA: Diagnosis not present

## 2017-06-05 DIAGNOSIS — L570 Actinic keratosis: Secondary | ICD-10-CM | POA: Diagnosis not present

## 2017-06-05 DIAGNOSIS — I10 Essential (primary) hypertension: Principal | ICD-10-CM

## 2017-06-05 DIAGNOSIS — E1159 Type 2 diabetes mellitus with other circulatory complications: Secondary | ICD-10-CM

## 2017-06-05 DIAGNOSIS — Z85828 Personal history of other malignant neoplasm of skin: Secondary | ICD-10-CM | POA: Diagnosis not present

## 2017-06-05 NOTE — Telephone Encounter (Signed)
Pt needs to schedule prolia vaccine after 06/14/2017. Please advise Wendelyn Breslow so medication can be ordered.

## 2017-06-16 ENCOUNTER — Other Ambulatory Visit: Payer: Medicare Other

## 2017-06-21 DIAGNOSIS — M9902 Segmental and somatic dysfunction of thoracic region: Secondary | ICD-10-CM | POA: Diagnosis not present

## 2017-06-21 DIAGNOSIS — M5137 Other intervertebral disc degeneration, lumbosacral region: Secondary | ICD-10-CM | POA: Diagnosis not present

## 2017-06-21 DIAGNOSIS — M5431 Sciatica, right side: Secondary | ICD-10-CM | POA: Diagnosis not present

## 2017-06-21 DIAGNOSIS — S29012A Strain of muscle and tendon of back wall of thorax, initial encounter: Secondary | ICD-10-CM | POA: Diagnosis not present

## 2017-06-21 DIAGNOSIS — M9903 Segmental and somatic dysfunction of lumbar region: Secondary | ICD-10-CM | POA: Diagnosis not present

## 2017-06-21 DIAGNOSIS — M9905 Segmental and somatic dysfunction of pelvic region: Secondary | ICD-10-CM | POA: Diagnosis not present

## 2017-06-22 DIAGNOSIS — M5431 Sciatica, right side: Secondary | ICD-10-CM | POA: Diagnosis not present

## 2017-06-22 DIAGNOSIS — S29012A Strain of muscle and tendon of back wall of thorax, initial encounter: Secondary | ICD-10-CM | POA: Diagnosis not present

## 2017-06-22 DIAGNOSIS — M9903 Segmental and somatic dysfunction of lumbar region: Secondary | ICD-10-CM | POA: Diagnosis not present

## 2017-06-22 DIAGNOSIS — M9902 Segmental and somatic dysfunction of thoracic region: Secondary | ICD-10-CM | POA: Diagnosis not present

## 2017-06-22 DIAGNOSIS — M9905 Segmental and somatic dysfunction of pelvic region: Secondary | ICD-10-CM | POA: Diagnosis not present

## 2017-06-22 DIAGNOSIS — M5137 Other intervertebral disc degeneration, lumbosacral region: Secondary | ICD-10-CM | POA: Diagnosis not present

## 2017-06-23 ENCOUNTER — Other Ambulatory Visit (INDEPENDENT_AMBULATORY_CARE_PROVIDER_SITE_OTHER): Payer: PRIVATE HEALTH INSURANCE

## 2017-06-23 DIAGNOSIS — M810 Age-related osteoporosis without current pathological fracture: Secondary | ICD-10-CM | POA: Diagnosis not present

## 2017-06-23 MED ORDER — DENOSUMAB 60 MG/ML ~~LOC~~ SOSY
60.0000 mg | PREFILLED_SYRINGE | Freq: Once | SUBCUTANEOUS | Status: AC
  Administered 2017-06-23: 60 mg via SUBCUTANEOUS

## 2017-06-26 DIAGNOSIS — S29012A Strain of muscle and tendon of back wall of thorax, initial encounter: Secondary | ICD-10-CM | POA: Diagnosis not present

## 2017-06-26 DIAGNOSIS — M9902 Segmental and somatic dysfunction of thoracic region: Secondary | ICD-10-CM | POA: Diagnosis not present

## 2017-06-26 DIAGNOSIS — M9903 Segmental and somatic dysfunction of lumbar region: Secondary | ICD-10-CM | POA: Diagnosis not present

## 2017-06-26 DIAGNOSIS — M5137 Other intervertebral disc degeneration, lumbosacral region: Secondary | ICD-10-CM | POA: Diagnosis not present

## 2017-06-26 DIAGNOSIS — M5431 Sciatica, right side: Secondary | ICD-10-CM | POA: Diagnosis not present

## 2017-06-26 DIAGNOSIS — M9905 Segmental and somatic dysfunction of pelvic region: Secondary | ICD-10-CM | POA: Diagnosis not present

## 2017-06-28 DIAGNOSIS — M9905 Segmental and somatic dysfunction of pelvic region: Secondary | ICD-10-CM | POA: Diagnosis not present

## 2017-06-28 DIAGNOSIS — M9902 Segmental and somatic dysfunction of thoracic region: Secondary | ICD-10-CM | POA: Diagnosis not present

## 2017-06-28 DIAGNOSIS — S29012A Strain of muscle and tendon of back wall of thorax, initial encounter: Secondary | ICD-10-CM | POA: Diagnosis not present

## 2017-06-28 DIAGNOSIS — M5431 Sciatica, right side: Secondary | ICD-10-CM | POA: Diagnosis not present

## 2017-06-28 DIAGNOSIS — M5137 Other intervertebral disc degeneration, lumbosacral region: Secondary | ICD-10-CM | POA: Diagnosis not present

## 2017-06-28 DIAGNOSIS — M9903 Segmental and somatic dysfunction of lumbar region: Secondary | ICD-10-CM | POA: Diagnosis not present

## 2017-06-30 DIAGNOSIS — M9903 Segmental and somatic dysfunction of lumbar region: Secondary | ICD-10-CM | POA: Diagnosis not present

## 2017-06-30 DIAGNOSIS — M5431 Sciatica, right side: Secondary | ICD-10-CM | POA: Diagnosis not present

## 2017-06-30 DIAGNOSIS — M9905 Segmental and somatic dysfunction of pelvic region: Secondary | ICD-10-CM | POA: Diagnosis not present

## 2017-06-30 DIAGNOSIS — M5137 Other intervertebral disc degeneration, lumbosacral region: Secondary | ICD-10-CM | POA: Diagnosis not present

## 2017-06-30 DIAGNOSIS — M9902 Segmental and somatic dysfunction of thoracic region: Secondary | ICD-10-CM | POA: Diagnosis not present

## 2017-06-30 DIAGNOSIS — S29012A Strain of muscle and tendon of back wall of thorax, initial encounter: Secondary | ICD-10-CM | POA: Diagnosis not present

## 2017-07-05 DIAGNOSIS — M9905 Segmental and somatic dysfunction of pelvic region: Secondary | ICD-10-CM | POA: Diagnosis not present

## 2017-07-05 DIAGNOSIS — S29012A Strain of muscle and tendon of back wall of thorax, initial encounter: Secondary | ICD-10-CM | POA: Diagnosis not present

## 2017-07-05 DIAGNOSIS — M5137 Other intervertebral disc degeneration, lumbosacral region: Secondary | ICD-10-CM | POA: Diagnosis not present

## 2017-07-05 DIAGNOSIS — M9903 Segmental and somatic dysfunction of lumbar region: Secondary | ICD-10-CM | POA: Diagnosis not present

## 2017-07-05 DIAGNOSIS — M9902 Segmental and somatic dysfunction of thoracic region: Secondary | ICD-10-CM | POA: Diagnosis not present

## 2017-07-05 DIAGNOSIS — M5431 Sciatica, right side: Secondary | ICD-10-CM | POA: Diagnosis not present

## 2017-07-06 ENCOUNTER — Ambulatory Visit (INDEPENDENT_AMBULATORY_CARE_PROVIDER_SITE_OTHER): Payer: Medicare Other | Admitting: Family Medicine

## 2017-07-06 ENCOUNTER — Encounter: Payer: Self-pay | Admitting: Family Medicine

## 2017-07-06 VITALS — Temp 97.7°F | Ht 65.0 in | Wt 271.2 lb

## 2017-07-06 DIAGNOSIS — E039 Hypothyroidism, unspecified: Secondary | ICD-10-CM

## 2017-07-06 NOTE — Progress Notes (Signed)
   Subjective:    Patient ID: Carla Wright, female    DOB: October 05, 1945, 72 y.o.   MRN: [id number]  HPI The pharmacy switched to a different brand for her thyroid medicine in March and she needs a recheck on it.  She has noted no change in her overall feeling.   Review of Systems     Objective:   Physical Exam Alert and in no distress otherwise not examined       Assessment & Plan:  Hypothyroidism, unspecified type - Plan: TSH

## 2017-07-07 DIAGNOSIS — M5431 Sciatica, right side: Secondary | ICD-10-CM | POA: Diagnosis not present

## 2017-07-07 DIAGNOSIS — M5137 Other intervertebral disc degeneration, lumbosacral region: Secondary | ICD-10-CM | POA: Diagnosis not present

## 2017-07-07 DIAGNOSIS — M9902 Segmental and somatic dysfunction of thoracic region: Secondary | ICD-10-CM | POA: Diagnosis not present

## 2017-07-07 DIAGNOSIS — M9905 Segmental and somatic dysfunction of pelvic region: Secondary | ICD-10-CM | POA: Diagnosis not present

## 2017-07-07 DIAGNOSIS — M9903 Segmental and somatic dysfunction of lumbar region: Secondary | ICD-10-CM | POA: Diagnosis not present

## 2017-07-07 DIAGNOSIS — S29012A Strain of muscle and tendon of back wall of thorax, initial encounter: Secondary | ICD-10-CM | POA: Diagnosis not present

## 2017-07-07 LAB — TSH: TSH: 3.55 u[IU]/mL (ref 0.450–4.500)

## 2017-07-10 DIAGNOSIS — M9902 Segmental and somatic dysfunction of thoracic region: Secondary | ICD-10-CM | POA: Diagnosis not present

## 2017-07-10 DIAGNOSIS — S29012A Strain of muscle and tendon of back wall of thorax, initial encounter: Secondary | ICD-10-CM | POA: Diagnosis not present

## 2017-07-10 DIAGNOSIS — M5431 Sciatica, right side: Secondary | ICD-10-CM | POA: Diagnosis not present

## 2017-07-10 DIAGNOSIS — M9905 Segmental and somatic dysfunction of pelvic region: Secondary | ICD-10-CM | POA: Diagnosis not present

## 2017-07-10 DIAGNOSIS — M9903 Segmental and somatic dysfunction of lumbar region: Secondary | ICD-10-CM | POA: Diagnosis not present

## 2017-07-10 DIAGNOSIS — M5137 Other intervertebral disc degeneration, lumbosacral region: Secondary | ICD-10-CM | POA: Diagnosis not present

## 2017-07-13 DIAGNOSIS — M5137 Other intervertebral disc degeneration, lumbosacral region: Secondary | ICD-10-CM | POA: Diagnosis not present

## 2017-07-13 DIAGNOSIS — M9903 Segmental and somatic dysfunction of lumbar region: Secondary | ICD-10-CM | POA: Diagnosis not present

## 2017-07-13 DIAGNOSIS — S29012A Strain of muscle and tendon of back wall of thorax, initial encounter: Secondary | ICD-10-CM | POA: Diagnosis not present

## 2017-07-13 DIAGNOSIS — M5431 Sciatica, right side: Secondary | ICD-10-CM | POA: Diagnosis not present

## 2017-07-13 DIAGNOSIS — M9905 Segmental and somatic dysfunction of pelvic region: Secondary | ICD-10-CM | POA: Diagnosis not present

## 2017-07-13 DIAGNOSIS — M9902 Segmental and somatic dysfunction of thoracic region: Secondary | ICD-10-CM | POA: Diagnosis not present

## 2017-07-17 DIAGNOSIS — M9905 Segmental and somatic dysfunction of pelvic region: Secondary | ICD-10-CM | POA: Diagnosis not present

## 2017-07-17 DIAGNOSIS — S29012A Strain of muscle and tendon of back wall of thorax, initial encounter: Secondary | ICD-10-CM | POA: Diagnosis not present

## 2017-07-17 DIAGNOSIS — M9902 Segmental and somatic dysfunction of thoracic region: Secondary | ICD-10-CM | POA: Diagnosis not present

## 2017-07-17 DIAGNOSIS — M9903 Segmental and somatic dysfunction of lumbar region: Secondary | ICD-10-CM | POA: Diagnosis not present

## 2017-07-17 DIAGNOSIS — M5137 Other intervertebral disc degeneration, lumbosacral region: Secondary | ICD-10-CM | POA: Diagnosis not present

## 2017-07-17 DIAGNOSIS — M5431 Sciatica, right side: Secondary | ICD-10-CM | POA: Diagnosis not present

## 2017-07-21 DIAGNOSIS — M5431 Sciatica, right side: Secondary | ICD-10-CM | POA: Diagnosis not present

## 2017-07-21 DIAGNOSIS — M5137 Other intervertebral disc degeneration, lumbosacral region: Secondary | ICD-10-CM | POA: Diagnosis not present

## 2017-07-21 DIAGNOSIS — M9905 Segmental and somatic dysfunction of pelvic region: Secondary | ICD-10-CM | POA: Diagnosis not present

## 2017-07-21 DIAGNOSIS — M9902 Segmental and somatic dysfunction of thoracic region: Secondary | ICD-10-CM | POA: Diagnosis not present

## 2017-07-21 DIAGNOSIS — M9903 Segmental and somatic dysfunction of lumbar region: Secondary | ICD-10-CM | POA: Diagnosis not present

## 2017-07-21 DIAGNOSIS — S29012A Strain of muscle and tendon of back wall of thorax, initial encounter: Secondary | ICD-10-CM | POA: Diagnosis not present

## 2017-07-26 DIAGNOSIS — M9903 Segmental and somatic dysfunction of lumbar region: Secondary | ICD-10-CM | POA: Diagnosis not present

## 2017-07-26 DIAGNOSIS — M5137 Other intervertebral disc degeneration, lumbosacral region: Secondary | ICD-10-CM | POA: Diagnosis not present

## 2017-07-26 DIAGNOSIS — S29012A Strain of muscle and tendon of back wall of thorax, initial encounter: Secondary | ICD-10-CM | POA: Diagnosis not present

## 2017-07-26 DIAGNOSIS — M5431 Sciatica, right side: Secondary | ICD-10-CM | POA: Diagnosis not present

## 2017-07-26 DIAGNOSIS — M9902 Segmental and somatic dysfunction of thoracic region: Secondary | ICD-10-CM | POA: Diagnosis not present

## 2017-07-26 DIAGNOSIS — M9905 Segmental and somatic dysfunction of pelvic region: Secondary | ICD-10-CM | POA: Diagnosis not present

## 2017-08-02 DIAGNOSIS — M9902 Segmental and somatic dysfunction of thoracic region: Secondary | ICD-10-CM | POA: Diagnosis not present

## 2017-08-02 DIAGNOSIS — M5431 Sciatica, right side: Secondary | ICD-10-CM | POA: Diagnosis not present

## 2017-08-02 DIAGNOSIS — S29012A Strain of muscle and tendon of back wall of thorax, initial encounter: Secondary | ICD-10-CM | POA: Diagnosis not present

## 2017-08-02 DIAGNOSIS — M9903 Segmental and somatic dysfunction of lumbar region: Secondary | ICD-10-CM | POA: Diagnosis not present

## 2017-08-02 DIAGNOSIS — M9905 Segmental and somatic dysfunction of pelvic region: Secondary | ICD-10-CM | POA: Diagnosis not present

## 2017-08-02 DIAGNOSIS — M5137 Other intervertebral disc degeneration, lumbosacral region: Secondary | ICD-10-CM | POA: Diagnosis not present

## 2017-08-14 DIAGNOSIS — S29012A Strain of muscle and tendon of back wall of thorax, initial encounter: Secondary | ICD-10-CM | POA: Diagnosis not present

## 2017-08-14 DIAGNOSIS — M9902 Segmental and somatic dysfunction of thoracic region: Secondary | ICD-10-CM | POA: Diagnosis not present

## 2017-08-14 DIAGNOSIS — M5137 Other intervertebral disc degeneration, lumbosacral region: Secondary | ICD-10-CM | POA: Diagnosis not present

## 2017-08-14 DIAGNOSIS — M5431 Sciatica, right side: Secondary | ICD-10-CM | POA: Diagnosis not present

## 2017-08-14 DIAGNOSIS — M9905 Segmental and somatic dysfunction of pelvic region: Secondary | ICD-10-CM | POA: Diagnosis not present

## 2017-08-14 DIAGNOSIS — M9903 Segmental and somatic dysfunction of lumbar region: Secondary | ICD-10-CM | POA: Diagnosis not present

## 2017-08-28 DIAGNOSIS — S29012A Strain of muscle and tendon of back wall of thorax, initial encounter: Secondary | ICD-10-CM | POA: Diagnosis not present

## 2017-08-28 DIAGNOSIS — M9902 Segmental and somatic dysfunction of thoracic region: Secondary | ICD-10-CM | POA: Diagnosis not present

## 2017-08-28 DIAGNOSIS — M5137 Other intervertebral disc degeneration, lumbosacral region: Secondary | ICD-10-CM | POA: Diagnosis not present

## 2017-08-28 DIAGNOSIS — M9903 Segmental and somatic dysfunction of lumbar region: Secondary | ICD-10-CM | POA: Diagnosis not present

## 2017-08-28 DIAGNOSIS — M5431 Sciatica, right side: Secondary | ICD-10-CM | POA: Diagnosis not present

## 2017-08-28 DIAGNOSIS — M9905 Segmental and somatic dysfunction of pelvic region: Secondary | ICD-10-CM | POA: Diagnosis not present

## 2017-09-04 DIAGNOSIS — L570 Actinic keratosis: Secondary | ICD-10-CM | POA: Diagnosis not present

## 2017-09-25 DIAGNOSIS — M9902 Segmental and somatic dysfunction of thoracic region: Secondary | ICD-10-CM | POA: Diagnosis not present

## 2017-09-25 DIAGNOSIS — M5431 Sciatica, right side: Secondary | ICD-10-CM | POA: Diagnosis not present

## 2017-09-25 DIAGNOSIS — M9905 Segmental and somatic dysfunction of pelvic region: Secondary | ICD-10-CM | POA: Diagnosis not present

## 2017-09-25 DIAGNOSIS — M9903 Segmental and somatic dysfunction of lumbar region: Secondary | ICD-10-CM | POA: Diagnosis not present

## 2017-09-25 DIAGNOSIS — S29012A Strain of muscle and tendon of back wall of thorax, initial encounter: Secondary | ICD-10-CM | POA: Diagnosis not present

## 2017-09-25 DIAGNOSIS — M5137 Other intervertebral disc degeneration, lumbosacral region: Secondary | ICD-10-CM | POA: Diagnosis not present

## 2017-11-12 ENCOUNTER — Other Ambulatory Visit: Payer: Self-pay | Admitting: Family Medicine

## 2017-11-12 DIAGNOSIS — Z8739 Personal history of other diseases of the musculoskeletal system and connective tissue: Secondary | ICD-10-CM

## 2017-12-03 ENCOUNTER — Other Ambulatory Visit: Payer: Self-pay | Admitting: Family Medicine

## 2017-12-03 DIAGNOSIS — E785 Hyperlipidemia, unspecified: Principal | ICD-10-CM

## 2017-12-03 DIAGNOSIS — I10 Essential (primary) hypertension: Secondary | ICD-10-CM

## 2017-12-03 DIAGNOSIS — E1169 Type 2 diabetes mellitus with other specified complication: Secondary | ICD-10-CM

## 2017-12-03 DIAGNOSIS — E1159 Type 2 diabetes mellitus with other circulatory complications: Secondary | ICD-10-CM

## 2017-12-18 DIAGNOSIS — E119 Type 2 diabetes mellitus without complications: Secondary | ICD-10-CM | POA: Diagnosis not present

## 2017-12-18 LAB — HM DIABETES EYE EXAM

## 2017-12-20 DIAGNOSIS — L570 Actinic keratosis: Secondary | ICD-10-CM | POA: Diagnosis not present

## 2017-12-20 DIAGNOSIS — Z23 Encounter for immunization: Secondary | ICD-10-CM | POA: Diagnosis not present

## 2017-12-21 ENCOUNTER — Telehealth: Payer: Self-pay

## 2017-12-21 NOTE — Telephone Encounter (Signed)
Called pt to advise that we have some gaps in care to close Pt needs fall and depression screening ,A1c, Hypertension control appt. [place]

## 2017-12-25 ENCOUNTER — Other Ambulatory Visit (INDEPENDENT_AMBULATORY_CARE_PROVIDER_SITE_OTHER): Payer: Medicare Other

## 2017-12-25 DIAGNOSIS — Z23 Encounter for immunization: Secondary | ICD-10-CM

## 2017-12-25 DIAGNOSIS — M810 Age-related osteoporosis without current pathological fracture: Secondary | ICD-10-CM | POA: Diagnosis not present

## 2017-12-25 MED ORDER — DENOSUMAB 60 MG/ML ~~LOC~~ SOSY
60.0000 mg | PREFILLED_SYRINGE | Freq: Once | SUBCUTANEOUS | Status: AC
  Administered 2017-12-25: 60 mg via SUBCUTANEOUS

## 2018-01-10 ENCOUNTER — Other Ambulatory Visit: Payer: Self-pay | Admitting: Family Medicine

## 2018-01-10 DIAGNOSIS — E039 Hypothyroidism, unspecified: Secondary | ICD-10-CM

## 2018-04-28 ENCOUNTER — Encounter: Payer: Self-pay | Admitting: Gastroenterology

## 2018-05-07 ENCOUNTER — Telehealth: Payer: Self-pay | Admitting: Family Medicine

## 2018-05-07 NOTE — Telephone Encounter (Signed)
Left message for pt to call. Need to verify ins for 2020. Need to verify for pt's next Prolia Injection.

## 2018-05-28 ENCOUNTER — Other Ambulatory Visit: Payer: Self-pay | Admitting: Family Medicine

## 2018-05-28 DIAGNOSIS — E1159 Type 2 diabetes mellitus with other circulatory complications: Secondary | ICD-10-CM

## 2018-05-28 DIAGNOSIS — I10 Essential (primary) hypertension: Principal | ICD-10-CM

## 2018-06-26 ENCOUNTER — Encounter: Payer: Self-pay | Admitting: Family Medicine

## 2018-06-26 ENCOUNTER — Ambulatory Visit (INDEPENDENT_AMBULATORY_CARE_PROVIDER_SITE_OTHER): Payer: Medicare Other | Admitting: Family Medicine

## 2018-06-26 ENCOUNTER — Other Ambulatory Visit: Payer: Self-pay

## 2018-06-26 VITALS — Wt 270.0 lb

## 2018-06-26 DIAGNOSIS — N3000 Acute cystitis without hematuria: Secondary | ICD-10-CM | POA: Diagnosis not present

## 2018-06-26 MED ORDER — CIPROFLOXACIN HCL 500 MG PO TABS: 500.0000 mg | ORAL_TABLET | Freq: Two times a day (BID) | ORAL | 0 refills | Status: DC

## 2018-06-26 NOTE — Progress Notes (Signed)
   Subjective:    Patient ID: Carla Wright, female    DOB: 1945-11-16, 73 y.o.   MRN: [id number]  HPI Documentation for virtual telephone encounter.  Documentation for virtual audio and video telecommunications through [place] encounter: The patient was located at home. The provider was located in the office. The patient did consent to this visit and is aware of possible charges through their insurance for this visit. The other persons participating in this telemedicine service were none. Time spent on call was 5 minutes  This virtual service is not related to other E/M service within previous 7 days. She complains of a 1 day history of dysuria, urgency and frequency but no fever, chills.  She has had a couple of these in the past but none recently.   Review of Systems     Objective:   Physical Exam Alert and in no distress otherwise not examined      Assessment & Plan:  Acute cystitis without hematuria - Plan: ciprofloxacin (CIPRO) 500 MG tablet I explained that I thought this was consistent with a UTI.  She will call me when she finishes the antibiotic if still having difficulty.

## 2018-06-27 ENCOUNTER — Other Ambulatory Visit (INDEPENDENT_AMBULATORY_CARE_PROVIDER_SITE_OTHER): Payer: Medicare Other

## 2018-06-27 DIAGNOSIS — M810 Age-related osteoporosis without current pathological fracture: Secondary | ICD-10-CM | POA: Diagnosis not present

## 2018-06-27 MED ORDER — DENOSUMAB 60 MG/ML ~~LOC~~ SOSY
60.0000 mg | PREFILLED_SYRINGE | Freq: Once | SUBCUTANEOUS | Status: AC
  Administered 2018-06-27: 60 mg via SUBCUTANEOUS

## 2018-08-26 ENCOUNTER — Other Ambulatory Visit: Payer: Self-pay | Admitting: Family Medicine

## 2018-08-26 DIAGNOSIS — E1159 Type 2 diabetes mellitus with other circulatory complications: Secondary | ICD-10-CM

## 2018-09-11 ENCOUNTER — Telehealth: Payer: Self-pay

## 2018-09-11 ENCOUNTER — Other Ambulatory Visit: Payer: Self-pay | Admitting: Family Medicine

## 2018-09-11 DIAGNOSIS — L719 Rosacea, unspecified: Secondary | ICD-10-CM | POA: Diagnosis not present

## 2018-09-11 DIAGNOSIS — Z85828 Personal history of other malignant neoplasm of skin: Secondary | ICD-10-CM | POA: Diagnosis not present

## 2018-09-11 DIAGNOSIS — D225 Melanocytic nevi of trunk: Secondary | ICD-10-CM | POA: Diagnosis not present

## 2018-09-11 DIAGNOSIS — L821 Other seborrheic keratosis: Secondary | ICD-10-CM | POA: Diagnosis not present

## 2018-09-11 DIAGNOSIS — L814 Other melanin hyperpigmentation: Secondary | ICD-10-CM | POA: Diagnosis not present

## 2018-09-11 DIAGNOSIS — E039 Hypothyroidism, unspecified: Secondary | ICD-10-CM

## 2018-09-11 DIAGNOSIS — L570 Actinic keratosis: Secondary | ICD-10-CM | POA: Diagnosis not present

## 2018-09-11 DIAGNOSIS — B078 Other viral warts: Secondary | ICD-10-CM | POA: Diagnosis not present

## 2018-09-11 DIAGNOSIS — Z86018 Personal history of other benign neoplasm: Secondary | ICD-10-CM | POA: Diagnosis not present

## 2018-09-11 NOTE — Telephone Encounter (Signed)
LVM for pt to call and make a med check appt .KH °

## 2018-09-12 ENCOUNTER — Encounter: Payer: Self-pay | Admitting: Family Medicine

## 2018-09-12 ENCOUNTER — Other Ambulatory Visit: Payer: Self-pay

## 2018-09-12 ENCOUNTER — Ambulatory Visit (INDEPENDENT_AMBULATORY_CARE_PROVIDER_SITE_OTHER): Payer: Medicare Other | Admitting: Family Medicine

## 2018-09-12 ENCOUNTER — Other Ambulatory Visit: Payer: Self-pay | Admitting: Family Medicine

## 2018-09-12 VITALS — BP 126/82 | HR 99 | Temp 98.1°F | Wt 270.0 lb

## 2018-09-12 DIAGNOSIS — E1159 Type 2 diabetes mellitus with other circulatory complications: Secondary | ICD-10-CM | POA: Diagnosis not present

## 2018-09-12 DIAGNOSIS — Z9119 Patient's noncompliance with other medical treatment and regimen: Secondary | ICD-10-CM

## 2018-09-12 DIAGNOSIS — E039 Hypothyroidism, unspecified: Secondary | ICD-10-CM

## 2018-09-12 DIAGNOSIS — G4733 Obstructive sleep apnea (adult) (pediatric): Secondary | ICD-10-CM

## 2018-09-12 DIAGNOSIS — I10 Essential (primary) hypertension: Secondary | ICD-10-CM

## 2018-09-12 DIAGNOSIS — E119 Type 2 diabetes mellitus without complications: Secondary | ICD-10-CM

## 2018-09-12 DIAGNOSIS — E11618 Type 2 diabetes mellitus with other diabetic arthropathy: Secondary | ICD-10-CM

## 2018-09-12 DIAGNOSIS — E785 Hyperlipidemia, unspecified: Secondary | ICD-10-CM

## 2018-09-12 DIAGNOSIS — E1169 Type 2 diabetes mellitus with other specified complication: Secondary | ICD-10-CM | POA: Diagnosis not present

## 2018-09-12 DIAGNOSIS — Z91199 Patient's noncompliance with other medical treatment and regimen due to unspecified reason: Secondary | ICD-10-CM

## 2018-09-12 DIAGNOSIS — Z8739 Personal history of other diseases of the musculoskeletal system and connective tissue: Secondary | ICD-10-CM

## 2018-09-12 DIAGNOSIS — M810 Age-related osteoporosis without current pathological fracture: Secondary | ICD-10-CM | POA: Diagnosis not present

## 2018-09-12 LAB — POCT GLYCOSYLATED HEMOGLOBIN (HGB A1C): Hemoglobin A1C: 10.4 % — AB (ref 4.0–5.6)

## 2018-09-12 MED ORDER — PIOGLITAZONE HCL 30 MG PO TABS: 30.0000 mg | ORAL_TABLET | Freq: Every day | ORAL | 5 refills | Status: DC

## 2018-09-12 MED ORDER — ATORVASTATIN CALCIUM 10 MG PO TABS: 10.0000 mg | ORAL_TABLET | Freq: Every day | ORAL | 3 refills | Status: DC

## 2018-09-12 MED ORDER — LISINOPRIL-HYDROCHLOROTHIAZIDE 20-12.5 MG PO TABS: 1.0000 | ORAL_TABLET | Freq: Every day | ORAL | 3 refills | Status: DC

## 2018-09-12 MED ORDER — ALLOPURINOL 300 MG PO TABS: 300.0000 mg | ORAL_TABLET | Freq: Every day | ORAL | 3 refills | Status: DC

## 2018-09-12 MED ORDER — METFORMIN HCL 1000 MG PO TABS: 1000.0000 mg | ORAL_TABLET | Freq: Two times a day (BID) | ORAL | 5 refills | Status: DC

## 2018-09-12 NOTE — Progress Notes (Signed)
Carla Wright is a 73 y.o. female who presents for annual wellness visit and follow-up on chronic medical conditions.  She has an underlying history of diabetes however has not followed up on this regularly.  She does not exercise.  She does not smoke or drink.  She does occasionally check her blood sugars and states that the are in the low 100s.  She does continue on her allopurinol and is having no difficulty with that.  She has a history of OSA but has not been able to tolerate the CPAP.  She states that she does not have any sleepiness issues during the day.  She continues on her thyroid medication without difficulty.  She is also taking blood pressure meds and statin without trouble.  She continues on Prolia.  Her arthritis causes very little trouble.  Immunizations and Health Maintenance Immunization History  Administered Date(s) Administered  . Influenza Split 11/28/2011  . Influenza, High Dose Seasonal PF 01/23/2013, 01/13/2014, 11/01/2016, 12/25/2017  . Pneumococcal Conjugate-13 09/09/2013  . Pneumococcal Polysaccharide-23 03/20/2009  . Tdap 02/20/2007, 01/10/2015  . Zoster 02/20/2007   Health Maintenance Due  Topic Date Due  . FOOT EXAM  05/02/1956  . PNA vac Low Risk Adult (2 of 2 - PPSV23) 09/10/2014  . HEMOGLOBIN A1C  05/01/2017    Last Pap smear:N/A Last mammogram: 2 years ago.  She will set up another mammogram. Last colonoscopy: 2012  last DEXA: 2018 Dentist:? Ophtho:[place] Exercise: none  Other doctors caring for patient include:[place] [place]  Advanced directives: Does Patient Have a Medical Advance Directive?: No Would patient like information on creating a medical advance directive?: Yes (MAU/Ambulatory/Procedural Areas - Information given)  Depression screen:  See questionnaire below.  Depression screen [facility] 2/9 09/12/2018 11/01/2016 08/26/2014 03/27/2013 01/23/2013  Decreased Interest 0 0 0 0 0  Down, Depressed, Hopeless 0 0 0 0 0  PHQ - 2 Score 0 0 0 0 0     Fall Risk Screen: see questionnaire below. Fall Risk  11/01/2016 10/30/2015 08/26/2014 03/27/2013 01/23/2013  Falls in the past year? No No No No No  Comment - Emmi Telephone Survey: data to providers prior to load - - -  Risk for fall due to : - - - - -    ADL screen:  See questionnaire below Functional Status Survey:     Review of Systems Constitutional: -, -unexpected weight change, -anorexia, -fatigue Allergy: -sneezing, -itching, -congestion Dermatology: denies changing moles, rash, lumps ENT: -runny nose, -ear pain, -sore throat,  Cardiology:  -chest pain, -palpitations, -orthopnea, Respiratory: -cough, -shortness of breath, -dyspnea on exertion, -wheezing,  Gastroenterology: -abdominal pain, -nausea, -vomiting, -diarrhea, -constipation, -dysphagia Hematology: -bleeding or bruising problems Musculoskeletal: -arthralgias, -myalgias, -joint swelling, -back pain, - Ophthalmology: -vision changes,  Urology: -dysuria, -difficulty urinating,  -urinary frequency, -urgency, incontinence Neurology: -, -numbness, , -memory loss, -falls, -dizziness    PHYSICAL EXAM:  BP 126/82 (BP Location: Left Arm, Patient Position: Sitting)   Pulse 99   Temp 98.1 F (36.7 C)   Wt 270 lb (122.5 kg)   SpO2 96%   BMI 44.93 kg/m   General Appearance: Alert, cooperative, no distress, appears stated age Head: Normocephalic, without obvious abnormality, atraumatic Eyes: PERRL, conjunctiva/corneas clear, EOM's intact,  Ears: Normal TM's and external ear canals Nose: Nares normal, mucosa normal, no drainage or sinus tenderness Throat: Lips, mucosa, and tongue normal; teeth and gums normal Neck: Supple, no lymphadenopathy;  thyroid:  no enlargement/tenderness/nodules; no carotid bruit or JVD Lungs: Clear to auscultation bilaterally  without wheezes, rales or ronchi; respirations unlabored Heart: Regular rate and rhythm, S1 and S2 normal, no murmur, rubor gallop Abdomen: Soft, non-tender,  nondistended, normoactive bowel sounds,  no masses, no hepatosplenomegaly Extremities: No clubbing, cyanosis or edema foot exam done Pulses: 2+ and symmetric all extremities Skin:  Skin color, texture, turgor normal, no rashes or lesions Lymph nodes: Cervical, supraclavicular, and axillary nodes normal Neurologic:  CNII-XII intact, normal strength, sensation and gait; reflexes 2+ and symmetric throughout Psych: Normal mood, affect, hygiene and grooming.  ASSESSMENT/PLAN: Type 2 diabetes mellitus without complication, without long-term current use of insulin (HCC) - Plan: CBC with Differential/Platelet, Comprehensive metabolic panel, Lipid panel, metFORMIN (GLUCOPHAGE) 1000 MG tablet, pioglitazone (ACTOS) 30 MG tablet, POCT glycosylated hemoglobin (Hb A1C),   Morbid obesity (HCC) - Plan: CBC with Differential/Platelet, Comprehensive metabolic panel, Lipid panel, I will refer to medical weight loss and wellness  History of gout - Plan: allopurinol (ZYLOPRIM) 300 MG tablet, Uric Acid,   Sleep apnea, obstructive - Plan: No intervention at this time  Hypothyroidism, unspecified type - Plan: TSH,   Hyperlipidemia associated with type 2 diabetes mellitus ([place]) - Plan: Lipid panel, atorvastatin (LIPITOR) 10 MG tablet,   Age-related osteoporosis without current pathological fracture - Plan: DG Bone Density,   Arthritis associated with diabetes ([place]) - Plan: Increase physical activity  Hypertension associated with diabetes ([place]) - Plan: CBC with Differential/Platelet, Comprehensive metabolic panel, lisinopril-hydrochlorothiazide (ZESTORETIC) 20-12.5 MG tablet,   Personal history of noncompliance with medical treatment, presenting hazards to health - Plan: I discussed the fact that her present lifestyle is dangerous to her health especially in ignoring taking good care of herself in regard to diet, exercise etc.  She was amenable to being referred to medical weight loss.  Hopefully this can help  reverse her poor health habits.     Immunization recommendations discussed.  Colonoscopy recommendations reviewed   Medicare Attestation I have personally reviewed: The patient's medical and social history Their use of alcohol, tobacco or illicit drugs Their current medications and supplements The patient's functional ability including ADLs,fall risks, home safety risks, cognitive, and hearing and visual impairment Diet and physical activities Evidence for depression or mood disorders  The patient's weight, height, and BMI have been recorded in the chart.  I have made referrals, counseling, and provided education to the patient based on review of the above and I have provided the patient with a written personalized care plan for preventive services.     Jill Alexanders, MD   09/12/2018  Carla Wright is a 73 y.o. female who presents for annual wellness visit and follow-up on chronic medical conditions.  She has the following concerns:  Immunizations and Health Maintenance Immunization History  Administered Date(s) Administered  . Influenza Split 11/28/2011  . Influenza, High Dose Seasonal PF 01/23/2013, 01/13/2014, 11/01/2016, 12/25/2017  . Pneumococcal Conjugate-13 09/09/2013  . Pneumococcal Polysaccharide-23 03/20/2009  . Tdap 02/20/2007, 01/10/2015  . Zoster 02/20/2007   Health Maintenance Due  Topic Date Due  . FOOT EXAM  05/02/1956  . PNA vac Low Risk Adult (2 of 2 - PPSV23) 09/10/2014  . HEMOGLOBIN A1C  05/01/2017    Last Pap smear: aged out  Last mammogram:11/21/16 Last colonoscopy:02-28-2010 Last DEXA:11/21/16 Dentist: two years ago Ophtho: 11/2017 Exercise: not much  Other doctors caring for patient include:Dr. Gloud eye,   Advanced directives: Does Patient Have a Medical Advance Directive?: No Would patient like information on creating a medical advance directive?: Yes (MAU/Ambulatory/Procedural Areas - Information given)  Depression screen:  See questionnaire  below.  Depression screen [facility] 2/9 09/12/2018 11/01/2016 08/26/2014 03/27/2013 01/23/2013  Decreased Interest 0 0 0 0 0  Down, Depressed, Hopeless 0 0 0 0 0  PHQ - 2 Score 0 0 0 0 0    Fall Risk Screen: see questionnaire below. Fall Risk  11/01/2016 10/30/2015 08/26/2014 03/27/2013 01/23/2013  Falls in the past year? No No No No No  Comment - Emmi Telephone Survey: data to providers prior to load - - -  Risk for fall due to : - - - - -    ADL screen:  See questionnaire below Functional Status Survey:     Review of Systems Constitutional: -, -unexpected weight change, -anorexia, -fatigue Allergy: -sneezing, -itching, -congestion Dermatology: denies changing moles, rash, lumps ENT: -runny nose, -ear pain, -sore throat,  Cardiology:  -chest pain, -palpitations, -orthopnea, Respiratory: -cough, -shortness of breath, -dyspnea on exertion, -wheezing,  Gastroenterology: -abdominal pain, -nausea, -vomiting, -diarrhea, -constipation, -dysphagia Hematology: -bleeding or bruising problems Musculoskeletal: -arthralgias, -myalgias, -joint swelling, -back pain, - Ophthalmology: -vision changes,  Urology: -dysuria, -difficulty urinating,  -urinary frequency, -urgency, incontinence Neurology: -, -numbness, , -memory loss, -falls, -dizziness    PHYSICAL EXAM:  BP 126/82 (BP Location: Left Arm, Patient Position: Sitting)   Pulse 99   Temp 98.1 F (36.7 C)   Wt 270 lb (122.5 kg)   SpO2 96%   BMI 44.93 kg/m   General Appearance: Alert, cooperative, no distress, appears stated age Head: Normocephalic, without obvious abnormality, atraumatic Eyes: PERRL, conjunctiva/corneas clear, EOM's intact, fundi benign Ears: Normal TM's and external ear canals Nose: Nares normal, mucosa normal, no drainage or sinus tenderness Throat: Lips, mucosa, and tongue normal; teeth and gums normal Neck: Supple, no lymphadenopathy;  thyroid:  no enlargement/tenderness/nodules; no carotid bruit or JVD Lungs: Clear to  auscultation bilaterally without wheezes, rales or ronchi; respirations unlabored Heart: Regular rate and rhythm, S1 and S2 normal, no murmur, rubor gallop Abdomen: Soft, non-tender, nondistended, normoactive bowel sounds,  no masses, no hepatosplenomegaly Extremities: No clubbing, cyanosis or edema Pulses: 2+ and symmetric all extremities Skin:  Skin color, texture, turgor normal, no rashes or lesions Lymph nodes: Cervical, supraclavicular, and axillary nodes normal Neurologic:  CNII-XII intact, normal strength, sensation and gait; reflexes 2+ and symmetric throughout Psych: Normal mood, affect, hygiene and grooming.  ASSESSMENT/PLAN:    Discussed monthly self breast exams and yearly mammograms; at least 30 minutes of aerobic activity at least 5 days/week and weight-bearing exercise 2x/week; proper sunscreen use reviewed; healthy diet, including goals of calcium and vitamin D intake and alcohol recommendations (less than or equal to 1 drink/day) reviewed; regular seatbelt use; changing batteries in smoke detectors.  Immunization recommendations discussed.  Colonoscopy recommendations reviewed   Medicare Attestation I have personally reviewed: The patient's medical and social history Their use of alcohol, tobacco or illicit drugs Their current medications and supplements The patient's functional ability including ADLs,fall risks, home safety risks, cognitive, and hearing and visual impairment Diet and physical activities Evidence for depression or mood disorders  The patient's weight, height, and BMI have been recorded in the chart.  I have made referrals, counseling, and provided education to the patient based on review of the above and I have provided the patient with a written personalized care plan for preventive services.     Jill Alexanders, MD   09/12/2018

## 2018-09-13 LAB — CBC WITH DIFFERENTIAL/PLATELET
Basophils Absolute: 0 10*3/uL (ref 0.0–0.2)
Basos: 0 %
EOS (ABSOLUTE): 0.2 10*3/uL (ref 0.0–0.4)
Eos: 2 %
Hematocrit: 43.3 % (ref 34.0–46.6)
Hemoglobin: 14.6 g/dL (ref 11.1–15.9)
Immature Grans (Abs): 0 10*3/uL (ref 0.0–0.1)
Immature Granulocytes: 0 %
Lymphocytes Absolute: 2.4 10*3/uL (ref 0.7–3.1)
Lymphs: 26 %
MCH: 31.1 pg (ref 26.6–33.0)
MCHC: 33.7 g/dL (ref 31.5–35.7)
MCV: 92 fL (ref 79–97)
Monocytes Absolute: 0.6 10*3/uL (ref 0.1–0.9)
Monocytes: 7 %
Neutrophils Absolute: 6 10*3/uL (ref 1.4–7.0)
Neutrophils: 65 %
Platelets: 219 10*3/uL (ref 150–450)
RBC: 4.7 x10E6/uL (ref 3.77–5.28)
RDW: 12.5 % (ref 11.7–15.4)
WBC: 9.2 10*3/uL (ref 3.4–10.8)

## 2018-09-13 LAB — LIPID PANEL
Chol/HDL Ratio: 3.3 ratio (ref 0.0–4.4)
Cholesterol, Total: 139 mg/dL (ref 100–199)
HDL: 42 mg/dL (ref >39)
LDL Calculated: 74 mg/dL (ref 0–99)
Triglycerides: 116 mg/dL (ref 0–149)
VLDL Cholesterol Cal: 23 mg/dL (ref 5–40)

## 2018-09-13 LAB — URIC ACID: Uric Acid: 4.5 mg/dL (ref 2.5–7.1)

## 2018-09-13 LAB — COMPREHENSIVE METABOLIC PANEL
ALT: 23 IU/L (ref 0–32)
AST: 19 IU/L (ref 0–40)
Albumin/Globulin Ratio: 1.5 (ref 1.2–2.2)
Albumin: 4.1 g/dL (ref 3.7–4.7)
Alkaline Phosphatase: 82 IU/L (ref 39–117)
BUN/Creatinine Ratio: 17 (ref 12–28)
BUN: 17 mg/dL (ref 8–27)
Bilirubin Total: 0.8 mg/dL (ref 0.0–1.2)
CO2: 23 mmol/L (ref 20–29)
Calcium: 10.3 mg/dL (ref 8.7–10.3)
Chloride: 96 mmol/L (ref 96–106)
Creatinine, Ser: 0.99 mg/dL (ref 0.57–1.00)
GFR calc Af Amer: 66 mL/min/{1.73_m2} (ref >59)
GFR calc non Af Amer: 57 mL/min/{1.73_m2} — ABNORMAL LOW (ref >59)
Globulin, Total: 2.7 g/dL (ref 1.5–4.5)
Glucose: 248 mg/dL — ABNORMAL HIGH (ref 65–99)
Potassium: 4.4 mmol/L (ref 3.5–5.2)
Sodium: 137 mmol/L (ref 134–144)
Total Protein: 6.8 g/dL (ref 6.0–8.5)

## 2018-09-13 LAB — TSH: TSH: 4.03 u[IU]/mL (ref 0.450–4.500)

## 2018-09-14 ENCOUNTER — Telehealth: Payer: Self-pay | Admitting: Family Medicine

## 2018-09-14 NOTE — Telephone Encounter (Signed)
Pt called and said the medication that was prescribed to her on wednesday has given her diarrhea. She thinks it is the Pioglitazone. She wants to know if she should continue it or stop

## 2018-09-14 NOTE — Telephone Encounter (Signed)
Pt was notified and will call back on monday

## 2018-09-14 NOTE — Telephone Encounter (Signed)
Let her know that it is the metformin causing it.  Have her cut the pill in half and take a half a pill twice per day and see if that makes a difference.  Have her call us back on Monday

## 2018-10-01 ENCOUNTER — Other Ambulatory Visit: Payer: Self-pay

## 2018-10-09 DIAGNOSIS — Z96643 Presence of artificial hip joint, bilateral: Secondary | ICD-10-CM | POA: Diagnosis not present

## 2018-10-09 DIAGNOSIS — R29890 Loss of height: Secondary | ICD-10-CM | POA: Diagnosis not present

## 2018-10-09 DIAGNOSIS — Z1231 Encounter for screening mammogram for malignant neoplasm of breast: Secondary | ICD-10-CM | POA: Diagnosis not present

## 2018-10-09 DIAGNOSIS — M8588 Other specified disorders of bone density and structure, other site: Secondary | ICD-10-CM | POA: Diagnosis not present

## 2018-10-09 LAB — HM DEXA SCAN: HM Dexa Scan: NORMAL

## 2018-10-09 LAB — HM MAMMOGRAPHY

## 2018-10-10 NOTE — Progress Notes (Signed)
lvm for pt to call back for recommendations . Per JCL pt need to take calcium and vit D. Carla Wright

## 2018-10-16 ENCOUNTER — Encounter: Payer: Self-pay | Admitting: Family Medicine

## 2018-12-13 ENCOUNTER — Encounter: Payer: Self-pay | Admitting: Family Medicine

## 2018-12-13 ENCOUNTER — Other Ambulatory Visit: Payer: Self-pay

## 2018-12-13 ENCOUNTER — Other Ambulatory Visit: Payer: Self-pay | Admitting: Family Medicine

## 2018-12-13 ENCOUNTER — Ambulatory Visit (INDEPENDENT_AMBULATORY_CARE_PROVIDER_SITE_OTHER): Payer: Medicare Other | Admitting: Family Medicine

## 2018-12-13 VITALS — BP 112/76 | HR 85 | Temp 97.8°F | Wt 261.0 lb

## 2018-12-13 DIAGNOSIS — E039 Hypothyroidism, unspecified: Secondary | ICD-10-CM

## 2018-12-13 DIAGNOSIS — E11618 Type 2 diabetes mellitus with other diabetic arthropathy: Secondary | ICD-10-CM

## 2018-12-13 DIAGNOSIS — E119 Type 2 diabetes mellitus without complications: Secondary | ICD-10-CM

## 2018-12-13 DIAGNOSIS — Z8739 Personal history of other diseases of the musculoskeletal system and connective tissue: Secondary | ICD-10-CM | POA: Diagnosis not present

## 2018-12-13 DIAGNOSIS — G4733 Obstructive sleep apnea (adult) (pediatric): Secondary | ICD-10-CM

## 2018-12-13 DIAGNOSIS — M810 Age-related osteoporosis without current pathological fracture: Secondary | ICD-10-CM

## 2018-12-13 DIAGNOSIS — Z96643 Presence of artificial hip joint, bilateral: Secondary | ICD-10-CM | POA: Diagnosis not present

## 2018-12-13 DIAGNOSIS — Z23 Encounter for immunization: Secondary | ICD-10-CM

## 2018-12-13 DIAGNOSIS — E1169 Type 2 diabetes mellitus with other specified complication: Secondary | ICD-10-CM | POA: Diagnosis not present

## 2018-12-13 DIAGNOSIS — E1159 Type 2 diabetes mellitus with other circulatory complications: Secondary | ICD-10-CM | POA: Diagnosis not present

## 2018-12-13 DIAGNOSIS — I10 Essential (primary) hypertension: Secondary | ICD-10-CM

## 2018-12-13 DIAGNOSIS — E785 Hyperlipidemia, unspecified: Secondary | ICD-10-CM

## 2018-12-13 LAB — POCT GLYCOSYLATED HEMOGLOBIN (HGB A1C): Hemoglobin A1C: 6.9 % — AB (ref 4.0–5.6)

## 2018-12-13 NOTE — Progress Notes (Signed)
Subjective:    Patient ID: Carla Wright, female    DOB: [birth year], 73 y.o.   MRN: [id number]  Carla Wright is a 73 y.o. female who presents for follow-up of Type 2 diabetes mellitus.  Home blood sugar records: Checks daily and they run usually less than 170 Current symptoms/problems include none and have been unchanged. Daily foot checks:   Any foot concerns: no Exercise: The patient does not participate in regular exercise at present.  She complains of right hip pain.  She has had bilateral hip replacements.  She also gets knee injections and is having more difficulty.  Her last injection was over 6 months ago. Diet: She has cut back on carbohydrates.  She has lost a significant amount of weight.  She has an eye exam set up in 2 weeks.  Metformin and pioglitazone was added to her regimen on her last visit.  She continues on lisinopril/HCTZ with no complaints.  She continues on her thyroid medication and is getting Prolia.  She is also taking allopurinol and has noted no symptoms.  She states that she has not heard from the weight loss and wellness program.  Continues on her CPAP. The following portions of the patient's history were reviewed and updated as appropriate: allergies, current medications, past medical history, past social history and problem list.  ROS as in subjective above.     Objective:    Physical Exam Alert and in no distress otherwise not examined.  Blood pressure 112/76, pulse 85, temperature 97.8 F (36.6 C), weight 261 lb (118.4 kg), SpO2 98 %.  Lab Review Diabetic Labs Latest Ref Rng & Units 09/12/2018 11/01/2016 09/14/2015 08/26/2014 01/13/2014  HbA1c 4.0 - 5.6 % 10.4(A) 7.0 6.0 5.8 5.9  Microalbumin mg/L - 16.8 7.0 49.6 -  Micro/Creat Ratio - - 25.9 97(H) 37.3 -  Chol 100 - 199 mg/dL 139 142 153 - 113  HDL >39 mg/dL 42 48(L) 56 - 48  Calc LDL 0 - 99 mg/dL 74 74 76 - 50  Triglycerides 0 - 149 mg/dL 116 102 105 - 73  Creatinine 0.57 - 1.00 mg/dL 0.99 0.96(H) 1.08(H)  - 0.96   BP/Weight 12/13/2018 09/12/2018 06/26/2018 07/06/2017 [id number]  Systolic BP XX123456 [id number] - - [id number]  Diastolic BP 76 82 - - 80  Wt. (Lbs) 261 270 270 271.2 265.6  BMI 43.43 44.93 44.93 45.13 44.2   Foot/eye exam completion dates Latest Ref Rng & Units 09/12/2018 12/18/2017  Eye Exam No Retinopathy - No Retinopathy  Foot Form Completion - Done -  Hemoglobin A1c is 6.9 Carla Wright  reports that she quit smoking about 37 years ago. She has a 15.00 pack-year smoking history. She has never used smokeless tobacco. She reports current alcohol use. She reports that she does not use drugs.     Assessment & Plan:    Type 2 diabetes mellitus without complication, without long-term current use of insulin (HCC) - Plan: Amb Ref to Medical Weight Management, POCT glycosylated hemoglobin (Hb A1C)  Morbid obesity ([place]) - Plan: Amb Ref to Medical Weight Management  Sleep apnea, obstructive  Hypothyroidism, unspecified type  Hyperlipidemia associated with type 2 diabetes mellitus ([place])  Age-related osteoporosis without current pathological fracture  Need for influenza vaccination - Plan: Flu Vaccine QUAD High Dose(Fluad)  Arthritis associated with diabetes ([place])  Status post total replacement of both hips  Hypertension associated with diabetes ([place])  History of gout  I congratulated her on her weight loss and improvement in her  overall diabetes.  She will follow-up with orthopedics concerning her hip pain as well as continue with injections for her knees.  Continue on all of her other medications.  I again put in an order for evaluation for weight loss and wellness. 1. Rx changes: none 2. Education: Reviewed 'ABCs' of diabetes management (respective goals in parentheses):  A1C (<7), blood pressure (<130/80), and cholesterol (LDL <100). 3. Compliance at present is estimated to be good. Efforts to improve compliance (if necessary) will be directed at increased exercise.  As tolerated du e to hip and knee  pain. 4. Follow up: 4 months

## 2018-12-25 DIAGNOSIS — H2513 Age-related nuclear cataract, bilateral: Secondary | ICD-10-CM | POA: Diagnosis not present

## 2018-12-25 DIAGNOSIS — H18593 Other hereditary corneal dystrophies, bilateral: Secondary | ICD-10-CM | POA: Diagnosis not present

## 2018-12-25 DIAGNOSIS — H52203 Unspecified astigmatism, bilateral: Secondary | ICD-10-CM | POA: Diagnosis not present

## 2018-12-25 DIAGNOSIS — E119 Type 2 diabetes mellitus without complications: Secondary | ICD-10-CM | POA: Diagnosis not present

## 2018-12-25 LAB — HM DIABETES EYE EXAM

## 2018-12-28 ENCOUNTER — Other Ambulatory Visit: Payer: Self-pay

## 2018-12-28 ENCOUNTER — Other Ambulatory Visit (INDEPENDENT_AMBULATORY_CARE_PROVIDER_SITE_OTHER): Payer: Medicare Other

## 2018-12-28 DIAGNOSIS — M810 Age-related osteoporosis without current pathological fracture: Secondary | ICD-10-CM

## 2018-12-28 MED ORDER — DENOSUMAB 60 MG/ML ~~LOC~~ SOSY
60.0000 mg | PREFILLED_SYRINGE | Freq: Once | SUBCUTANEOUS | Status: AC
  Administered 2018-12-28: 60 mg via SUBCUTANEOUS

## 2019-01-15 ENCOUNTER — Ambulatory Visit (INDEPENDENT_AMBULATORY_CARE_PROVIDER_SITE_OTHER): Payer: Medicare Other | Admitting: Family Medicine

## 2019-01-29 ENCOUNTER — Ambulatory Visit (INDEPENDENT_AMBULATORY_CARE_PROVIDER_SITE_OTHER): Payer: Medicare Other | Admitting: Family Medicine

## 2019-03-09 ENCOUNTER — Other Ambulatory Visit: Payer: Self-pay | Admitting: Family Medicine

## 2019-03-09 DIAGNOSIS — E039 Hypothyroidism, unspecified: Secondary | ICD-10-CM

## 2019-03-09 DIAGNOSIS — E119 Type 2 diabetes mellitus without complications: Secondary | ICD-10-CM

## 2019-03-26 ENCOUNTER — Ambulatory Visit: Payer: PRIVATE HEALTH INSURANCE

## 2019-04-04 ENCOUNTER — Ambulatory Visit: Payer: Medicare Other | Attending: Internal Medicine

## 2019-04-04 DIAGNOSIS — Z23 Encounter for immunization: Secondary | ICD-10-CM

## 2019-04-04 NOTE — Progress Notes (Signed)
   Covid-19 Vaccination Clinic  Name:  Carla Wright    MRN: [id number] DOB: 1945/10/17  04/04/2019  [name] was observed post Covid-19 immunization for 15 minutes without incidence. She was provided with Vaccine Information Sheet and instruction to access the V-Safe system.   [name] was instructed to call 911 with any severe reactions post vaccine: Marland Kitchen Difficulty breathing  . Swelling of your face and throat  . A fast heartbeat  . A bad rash all over your body  . Dizziness and weakness    Immunizations Administered    Name Date Dose VIS Date Route   Pfizer COVID-19 Vaccine 04/04/2019  8:31 AM 0.3 mL 02/08/2019 Intramuscular   Manufacturer: [place]   Lot: [id number]   [place]: [id number]

## 2019-04-16 ENCOUNTER — Encounter: Payer: Self-pay | Admitting: Medical

## 2019-04-16 ENCOUNTER — Ambulatory Visit: Payer: Medicare Other | Admitting: Family Medicine

## 2019-04-16 ENCOUNTER — Ambulatory Visit (INDEPENDENT_AMBULATORY_CARE_PROVIDER_SITE_OTHER): Payer: Medicare Other | Admitting: Medical

## 2019-04-16 ENCOUNTER — Other Ambulatory Visit: Payer: Self-pay

## 2019-04-16 VITALS — Temp 97.1°F | Wt 263.0 lb

## 2019-04-16 DIAGNOSIS — N390 Urinary tract infection, site not specified: Secondary | ICD-10-CM

## 2019-04-16 DIAGNOSIS — R300 Dysuria: Secondary | ICD-10-CM | POA: Diagnosis not present

## 2019-04-16 MED ORDER — NITROFURANTOIN MONOHYD MACRO 100 MG PO CAPS: 100.0000 mg | ORAL_CAPSULE | Freq: Two times a day (BID) | ORAL | 0 refills | Status: DC

## 2019-04-16 NOTE — Progress Notes (Addendum)
This visit type was conducted due to national recommendations for restrictions regarding the COVID-19 Pandemic (e.g. social distancing) in an effort to limit this patient's exposure and mitigate transmission in our community.  Due to their co-morbid illnesses, this patient is at least at moderate risk for complications without adequate follow up.  This format is felt to be most appropriate for this patient at this time.    Documentation for virtual audio and video telecommunications through Zoom encounter:  The patient was located at home. The provider was located in the office. The patient did consent to this visit and is aware of possible charges through their insurance for this visit.  The other persons participating in this telemedicine service were none. Time spent on call was 20 minutes and in review of previous records >20 minutes total.  This virtual service is not related to other E/M service within previous 7 days.  Subjective: Chief Complaint  Patient presents with  . UTI-    4-5 days worth of possible UTI- burning, cloudy and pressure., frequency, taking cranberry pill. had UTI last year      Carla Wright is a 74 y.o. female who complains of possible urinary tract infection.  They report symptoms for 4 days.  Symptoms include burning and pain with urination, cloudy urine, urinary pressure, urinary frequency.  Using some cranberry pills.  She denies fever, chills, body aches, nausea, vomiting, diarrhea.  She does drink lots of water.  No urinary odor.  Blood sugars been running less than 130 fasting.  No back or belly pain.  No vaginal discharge.  Last urinary tract infection last year some time.  She does not get these often.  No recent heavy caffeine use.  No baths lately, normally uses showers.  She notes that she has been wearing spandex pants without underwear and wonders if this may have led to the UTI.  Otherwise feels fine.  No other aggravating or relieving factors.  No other  c/o.  Past Medical History:  Diagnosis Date  . Actinic keratoses   . Allergy   . Arthritis   . Cancer ([place])    basal cell removed from face  . Diverticulosis   . Glucose intolerance (impaired glucose tolerance)   . Gout   . Hypertension    EKG and clearance Dr Judson Roch on chart  . Hypothyroidism   . Obesity   . Sleep apnea    doesnt used machine in months-per Dr [employer] note- borderline  . Thyroid disease     Current Outpatient Medications on File Prior to Visit  Medication Sig Dispense Refill  . allopurinol (ZYLOPRIM) 300 MG tablet Take 1 tablet (300 mg total) by mouth daily. 90 tablet 3  . aspirin 81 MG tablet Take 81 mg by mouth daily.    Marland Kitchen atorvastatin (LIPITOR) 10 MG tablet Take 1 tablet (10 mg total) by mouth daily. 90 tablet 3  . BIOTIN PO Take by mouth.    . co-enzyme Q-10 30 MG capsule Take 30 mg by mouth 3 (three) times daily.    Arna Medici 88 MCG tablet Take 1 tablet by mouth once daily 90 tablet 0  . lisinopril-hydrochlorothiazide (ZESTORETIC) 20-12.5 MG tablet Take 1 tablet by mouth daily. 90 tablet 3  . metFORMIN (GLUCOPHAGE) 1000 MG tablet TAKE 1 TABLET BY MOUTH TWICE DAILY WITH MEALS 180 tablet 0  . Multiple Vitamin (MULTIVITAMIN WITH MINERALS) TABS Take 1 tablet by mouth daily.    . pioglitazone (ACTOS) 30 MG tablet Take  1 tablet by mouth once daily 90 tablet 0  . ciprofloxacin (CIPRO) 500 MG tablet Take 1 tablet (500 mg total) by mouth 2 (two) times daily. (Patient not taking: Reported on 09/12/2018) 20 tablet 0   No current facility-administered medications on file prior to visit.    ROS as in subjective  Reviewed allergies, medications, past medical, surgical, and social history.     Objective: Vitals:   04/16/19 1107  Temp: (!) 97.1 F (36.2 C)   Not examined in person as this was a virtual consult    Assessment: Encounter Diagnoses  Name Primary?  . Dysuria Yes  . Urinary tract infection without hematuria, site unspecified       Plan: We discussed the limitations of virtual consult.  We discussed possible differential but likely UTI  Discussed symptoms, likely diagnosis or urinary tract infection, possible complications, and usual course of illness.  Begin medication Macrobid.  Of note she is allergic to sulfur, and Cipro has more risk given her options.  Advised increased water intake, can use OTC Tylenol for pain prn.    Call or return if worse or not improving in the next 3-4 days.    She is already scheduled for follow-up here for routine in March.  We will recheck urine then.   [name] was seen today for uti-.  Diagnoses and all orders for this visit:  Dysuria  Urinary tract infection without hematuria, site unspecified  Other orders -     nitrofurantoin, macrocrystal-monohydrate, (MACROBID) 100 MG capsule; Take 1 capsule (100 mg total) by mouth 2 (two) times daily.

## 2019-04-25 ENCOUNTER — Ambulatory Visit: Payer: Medicare Other | Admitting: Family Medicine

## 2019-04-29 ENCOUNTER — Other Ambulatory Visit: Payer: Self-pay

## 2019-04-29 ENCOUNTER — Ambulatory Visit (INDEPENDENT_AMBULATORY_CARE_PROVIDER_SITE_OTHER): Payer: Medicare Other | Admitting: Family Medicine

## 2019-04-29 ENCOUNTER — Ambulatory Visit: Payer: Medicare Other | Attending: Internal Medicine

## 2019-04-29 VITALS — BP 118/74 | HR 105 | Temp 96.9°F | Wt 256.2 lb

## 2019-04-29 DIAGNOSIS — Z23 Encounter for immunization: Secondary | ICD-10-CM | POA: Insufficient documentation

## 2019-04-29 DIAGNOSIS — E119 Type 2 diabetes mellitus without complications: Secondary | ICD-10-CM | POA: Diagnosis not present

## 2019-04-29 DIAGNOSIS — E1121 Type 2 diabetes mellitus with diabetic nephropathy: Secondary | ICD-10-CM | POA: Insufficient documentation

## 2019-04-29 DIAGNOSIS — G4733 Obstructive sleep apnea (adult) (pediatric): Secondary | ICD-10-CM

## 2019-04-29 DIAGNOSIS — Z96643 Presence of artificial hip joint, bilateral: Secondary | ICD-10-CM

## 2019-04-29 LAB — POCT GLYCOSYLATED HEMOGLOBIN (HGB A1C): Hemoglobin A1C: 6.7 % — AB (ref 4.0–5.6)

## 2019-04-29 LAB — POCT UA - MICROALBUMIN
Albumin/Creatinine Ratio, Urine, POC: 236.1
Creatinine, POC: 91.8 mg/dL
Microalbumin Ur, POC: 216.7 mg/L

## 2019-04-29 NOTE — Progress Notes (Signed)
   Covid-19 Vaccination Clinic  Name:  PEGGE NIPPLE    MRN: [id number] DOB: 08/06/1945  04/29/2019  Ms. Nitti was observed post Covid-19 immunization for 15 minutes without incidence. She was provided with Vaccine Information Sheet and instruction to access the V-Safe system.   Ms. Mazor was instructed to call 911 with any severe reactions post vaccine: Marland Kitchen Difficulty breathing  . Swelling of your face and throat  . A fast heartbeat  . A bad rash all over your body  . Dizziness and weakness    Immunizations Administered    Name Date Dose VIS Date Route   Pfizer COVID-19 Vaccine 04/29/2019 12:43 PM 0.3 mL 02/08/2019 Intramuscular   Manufacturer: [place]   Lot: [id number]   [place]: [id number]

## 2019-04-29 NOTE — Progress Notes (Signed)
Subjective:    Patient ID: Carla Wright, female    DOB: 02-27-1946, 74 y.o.   MRN: [id number]  Carla Wright is a 74 y.o. female who presents for follow-up of Type 2 diabetes mellitus.  Home blood sugar records:fasting and post meal , meter logs, 130-172 Current symptoms/problems include none at this time. Daily foot checks:yes   Any foot concerns: none Exercise: not at this time.she has had bilateral hip replacement and still has some fear of walking although does keep a walk around.   Diet:regular She continues on Metformin and pioglitazone is having no difficulty with that.  She continues on allopurinol for her gout but is having no symptoms.  All atorvastatin is causing no aches or pains.  She continues on lisinopril/HCTZ with no swelling or coughing.  She does have OSA but is not using CPAP and does not intend to.  She continues on Prolia for her osteoporosis.  She does have a history of UTI and would like that checked again. The following portions of the patient's history were reviewed and updated as appropriate: allergies, current medications, past medical history, past social history and problem list.  ROS as in subjective above.     Objective:    Physical Exam Alert and in no distress otherwise not examined. A/C ratio is 236.1 Hemoglobin A1c is 6.7 Lab Review Diabetic Labs Latest Ref Rng & Units 04/29/2019 12/13/2018 09/12/2018 11/01/2016 09/14/2015  HbA1c 4.0 - 5.6 % 6.7(A) 6.9(A) 10.4(A) 7.0 6.0  Microalbumin mg/L 216.7 - - 16.8 7.0  Micro/Creat Ratio - 236.1 - - 25.9 97(H)  Chol 100 - 199 mg/dL - - 139 142 153  HDL >39 mg/dL - - 42 48(L) 56  Calc LDL 0 - 99 mg/dL - - 74 74 76  Triglycerides 0 - 149 mg/dL - - 116 102 105  Creatinine 0.57 - 1.00 mg/dL - - 0.99 0.96(H) 1.08(H)   BP/Weight 04/29/2019 04/16/2019 12/13/2018 09/12/2018 [id number]  Systolic BP 123456 - [id number] 123XX123 -  Diastolic BP 74 - 76 82 -  Wt. (Lbs) 256.2 263 261 270 270  BMI 42.63 43.77 43.43 44.93 44.93   Foot/eye exam  completion dates Latest Ref Rng & Units 12/25/2018 09/12/2018  Eye Exam No Retinopathy No Retinopathy -  Foot Form Completion - - Done    Carla Wright  reports that she quit smoking about 37 years ago. She has a 15.00 pack-year smoking history. She has never used smokeless tobacco. She reports current alcohol use. She reports that she does not use drugs.     Assessment & Plan:    Type 2 diabetes mellitus without complication, without long-term current use of insulin (HCC) - Plan: POCT glycosylated hemoglobin (Hb A1C), POCT UA - Microalbumin  Status post total replacement of both hips  Morbid obesity (HCC)  Sleep apnea, obstructive  Diabetic nephropathy associated with type 2 diabetes mellitus ([place])   1. Rx changes: none 2. Education: Reviewed 'ABCs' of diabetes management (respective goals in parentheses):  A1C (<7), blood pressure (<130/80), and cholesterol (LDL <100). 3. Compliance at present is estimated to be fair. Efforts to improve compliance (if necessary) will be directed at increased exercise.  Encouraged her to use her walker for safety purposes and start walking.   4. Follow up: 4 months She left before the urine report came back.  My nurse will explain to her that we need to continue to watch her diabetes function and reinforce the fact of staying on her present medication regimen.

## 2019-05-22 ENCOUNTER — Other Ambulatory Visit: Payer: Self-pay

## 2019-05-22 ENCOUNTER — Ambulatory Visit (INDEPENDENT_AMBULATORY_CARE_PROVIDER_SITE_OTHER): Payer: Medicare Other | Admitting: Family Medicine

## 2019-05-22 ENCOUNTER — Encounter: Payer: Self-pay | Admitting: Family Medicine

## 2019-05-22 VITALS — BP 120/74 | HR 91 | Temp 96.8°F | Wt 267.2 lb

## 2019-05-22 DIAGNOSIS — N3001 Acute cystitis with hematuria: Secondary | ICD-10-CM

## 2019-05-22 LAB — POCT URINALYSIS DIP (PROADVANTAGE DEVICE)
Bilirubin, UA: NEGATIVE
Glucose, UA: NEGATIVE mg/dL
Ketones, POC UA: NEGATIVE mg/dL
Nitrite, UA: POSITIVE — AB
Protein Ur, POC: 30 mg/dL — AB
Specific Gravity, Urine: 1.015
Urobilinogen, Ur: 0.2
pH, UA: 6 (ref 5.0–8.0)

## 2019-05-22 MED ORDER — CIPROFLOXACIN HCL 500 MG PO TABS: 500.0000 mg | ORAL_TABLET | Freq: Two times a day (BID) | ORAL | 0 refills | Status: DC

## 2019-05-22 NOTE — Progress Notes (Signed)
   Subjective:    Patient ID: Carla Wright, female    DOB: 1945-04-06, 74 y.o.   MRN: [id number]  HPI She has a 1 month history of difficulty with dysuria and frequency.  She was given Macrodantin on February 16 which did help but did not have the symptoms go away completely.  She did have a fever earlier today.   Review of Systems     Objective:   Physical Exam Alert and in no distress otherwise not examined.  Urine dipstick was positive for red cells white cells and nitrite tired.       Assessment & Plan:  Acute cystitis with hematuria - Plan: ciprofloxacin (CIPRO) 500 MG tablet, Urine Culture She is to return here in 2 weeks for a recheck to ensure that she has cleared her infection and the red cells.

## 2019-05-22 NOTE — Addendum Note (Signed)
Addended by: Elyse Jarvis on: 05/22/2019 10:37 AM   Modules accepted: Orders

## 2019-05-25 LAB — URINE CULTURE

## 2019-06-05 ENCOUNTER — Ambulatory Visit (INDEPENDENT_AMBULATORY_CARE_PROVIDER_SITE_OTHER): Payer: Medicare Other | Admitting: Family Medicine

## 2019-06-05 ENCOUNTER — Encounter: Payer: Self-pay | Admitting: Family Medicine

## 2019-06-05 ENCOUNTER — Other Ambulatory Visit: Payer: Self-pay

## 2019-06-05 VITALS — BP 120/72 | HR 90 | Temp 96.6°F | Wt 264.8 lb

## 2019-06-05 DIAGNOSIS — N3001 Acute cystitis with hematuria: Secondary | ICD-10-CM

## 2019-06-05 LAB — POCT URINALYSIS DIP (PROADVANTAGE DEVICE)
Bilirubin, UA: NEGATIVE
Glucose, UA: NEGATIVE mg/dL
Ketones, POC UA: NEGATIVE mg/dL
Nitrite, UA: NEGATIVE
Specific Gravity, Urine: 1.015
Urobilinogen, Ur: 0.2
pH, UA: 6 (ref 5.0–8.0)

## 2019-06-05 NOTE — Progress Notes (Signed)
   Subjective:    Patient ID: Carla Wright, female    DOB: [birth year], 74 y.o.   MRN: [id number]  HPI She is here for recheck.  She has had difficulty with cystitis in the past and her last urinalysis did show red cells.  She states that after about 3 days she felt much better.   Review of Systems     Objective:   Physical Exam Alert and in no distress.  Urine dipstick was positive.  Microscopic done by me did show multiple red cells.       Assessment & Plan:  Acute cystitis with hematuria - Plan: POCT Urinalysis DIP (Proadvantage Device), Ambulatory referral to Urology Since she is still having difficulty with hematuria.  I will refer her to urology.

## 2019-06-09 ENCOUNTER — Other Ambulatory Visit: Payer: Self-pay | Admitting: Family Medicine

## 2019-06-09 DIAGNOSIS — E039 Hypothyroidism, unspecified: Secondary | ICD-10-CM

## 2019-06-09 DIAGNOSIS — E119 Type 2 diabetes mellitus without complications: Secondary | ICD-10-CM

## 2019-07-05 ENCOUNTER — Other Ambulatory Visit: Payer: Self-pay

## 2019-07-05 ENCOUNTER — Other Ambulatory Visit (INDEPENDENT_AMBULATORY_CARE_PROVIDER_SITE_OTHER): Payer: Medicare Other

## 2019-07-05 DIAGNOSIS — M810 Age-related osteoporosis without current pathological fracture: Secondary | ICD-10-CM | POA: Diagnosis not present

## 2019-07-05 MED ORDER — DENOSUMAB 60 MG/ML ~~LOC~~ SOSY
60.0000 mg | PREFILLED_SYRINGE | Freq: Once | SUBCUTANEOUS | Status: AC
  Administered 2019-07-05: 60 mg via SUBCUTANEOUS

## 2019-08-05 DIAGNOSIS — R311 Benign essential microscopic hematuria: Secondary | ICD-10-CM | POA: Diagnosis not present

## 2019-08-29 ENCOUNTER — Encounter: Payer: Self-pay | Admitting: Family Medicine

## 2019-08-29 ENCOUNTER — Ambulatory Visit (INDEPENDENT_AMBULATORY_CARE_PROVIDER_SITE_OTHER): Payer: Medicare Other | Admitting: Family Medicine

## 2019-08-29 ENCOUNTER — Other Ambulatory Visit: Payer: Self-pay

## 2019-08-29 VITALS — BP 124/82 | HR 95 | Temp 98.7°F | Ht 65.0 in | Wt 256.6 lb

## 2019-08-29 DIAGNOSIS — I10 Essential (primary) hypertension: Secondary | ICD-10-CM

## 2019-08-29 DIAGNOSIS — E1121 Type 2 diabetes mellitus with diabetic nephropathy: Secondary | ICD-10-CM

## 2019-08-29 DIAGNOSIS — Z8739 Personal history of other diseases of the musculoskeletal system and connective tissue: Secondary | ICD-10-CM

## 2019-08-29 DIAGNOSIS — E1169 Type 2 diabetes mellitus with other specified complication: Secondary | ICD-10-CM

## 2019-08-29 DIAGNOSIS — E039 Hypothyroidism, unspecified: Secondary | ICD-10-CM

## 2019-08-29 DIAGNOSIS — E785 Hyperlipidemia, unspecified: Secondary | ICD-10-CM

## 2019-08-29 DIAGNOSIS — E1159 Type 2 diabetes mellitus with other circulatory complications: Secondary | ICD-10-CM

## 2019-08-29 DIAGNOSIS — E119 Type 2 diabetes mellitus without complications: Secondary | ICD-10-CM | POA: Diagnosis not present

## 2019-08-29 DIAGNOSIS — I152 Hypertension secondary to endocrine disorders: Secondary | ICD-10-CM

## 2019-08-29 DIAGNOSIS — G4733 Obstructive sleep apnea (adult) (pediatric): Secondary | ICD-10-CM | POA: Diagnosis not present

## 2019-08-29 DIAGNOSIS — M8580 Other specified disorders of bone density and structure, unspecified site: Secondary | ICD-10-CM | POA: Insufficient documentation

## 2019-08-29 DIAGNOSIS — Z96643 Presence of artificial hip joint, bilateral: Secondary | ICD-10-CM

## 2019-08-29 DIAGNOSIS — E11618 Type 2 diabetes mellitus with other diabetic arthropathy: Secondary | ICD-10-CM

## 2019-08-29 LAB — HEMOGLOBIN A1C: Hemoglobin A1C: 6

## 2019-08-29 MED ORDER — LISINOPRIL-HYDROCHLOROTHIAZIDE 20-12.5 MG PO TABS: 1.0000 | ORAL_TABLET | Freq: Every day | ORAL | 3 refills | Status: DC

## 2019-08-29 MED ORDER — PIOGLITAZONE HCL 30 MG PO TABS: 30.0000 mg | ORAL_TABLET | Freq: Every day | ORAL | 1 refills | Status: DC

## 2019-08-29 MED ORDER — ALLOPURINOL 300 MG PO TABS: 300.0000 mg | ORAL_TABLET | Freq: Every day | ORAL | 3 refills | Status: DC

## 2019-08-29 MED ORDER — METFORMIN HCL 1000 MG PO TABS: 1000.0000 mg | ORAL_TABLET | Freq: Two times a day (BID) | ORAL | 1 refills | Status: DC

## 2019-08-29 MED ORDER — LEVOTHYROXINE SODIUM 88 MCG PO TABS: 88.0000 ug | ORAL_TABLET | Freq: Every day | ORAL | 3 refills | Status: DC

## 2019-08-29 MED ORDER — ATORVASTATIN CALCIUM 10 MG PO TABS: 10.0000 mg | ORAL_TABLET | Freq: Every day | ORAL | 3 refills | Status: DC

## 2019-08-29 NOTE — Progress Notes (Signed)
Subjective:    Patient ID: Carla Wright, female    DOB: 1945/08/24, 74 y.o.   MRN: [id number]  Carla Wright is a 74 y.o. female who presents for follow-up of Type 2 diabetes mellitus.  Home blood sugar records: pt has not used meter Current symptoms/problems include none at this time. Daily foot checks: yes   Any foot concerns: none  Exercise: not at this time but staying active around the house Diet: good.  She has been making changes in her portion sizes and notes a slight improvement in her weight.  She has an eye exam set up for later this year she continues on Metformin without difficulty as well as Actos.  She is also taking lisinopril/HCTZ without difficulty and her thyroid medication and has no skin or hair changes.  She does take allopurinol for her gout and has not had a gout attack in quite some time.  She has had a recent DEXA which did show osteopenia.  She sleeps in a chair because she states that her hips are bothering her although she has had both replacements.  She does have underlying OSA but is not interested in using CPAP.  She says that sleeping in a chair actually helps with her breathing.  The following portions of the patient's history were reviewed and updated as appropriate: allergies, current medications, past medical history, past social history and problem list.  ROS as in subjective above.     Objective:    Physical Exam Alert and in no distress.  Foot exam is recorded and is normal.  Hemoglobin A1c is 6.0.   Lab Review Diabetic Labs Latest Ref Rng & Units 04/29/2019 12/13/2018 09/12/2018 11/01/2016 09/14/2015  HbA1c 4.0 - 5.6 % 6.7(A) 6.9(A) 10.4(A) 7.0 6.0  Microalbumin mg/L 216.7 - - 16.8 7.0  Micro/Creat Ratio - 236.1 - - 25.9 97(H)  Chol 100 - 199 mg/dL - - 139 142 153  HDL >39 mg/dL - - 42 48(L) 56  Calc LDL 0 - 99 mg/dL - - 74 74 76  Triglycerides 0 - 149 mg/dL - - 116 102 105  Creatinine 0.57 - 1.00 mg/dL - - 0.99 0.96(H) 1.08(H)   BP/Weight 08/29/2019  06/05/2019 05/22/2019 04/29/2019 3/90/3009  Systolic BP 233 007 622 633 -  Diastolic BP 82 72 74 74 -  Wt. (Lbs) 256.6 264.8 267.2 256.2 263  BMI 42.7 44.07 44.46 42.63 43.77   Foot/eye exam completion dates Latest Ref Rng & Units 08/29/2019 12/25/2018  Eye Exam No Retinopathy - No Retinopathy  Foot Form Completion - Done -    Carla Wright  reports that she quit smoking about 38 years ago. She has a 15.00 pack-year smoking history. She has never used smokeless tobacco. She reports current alcohol use. She reports that she does not use drugs.     Assessment & Plan:     Type 2 diabetes mellitus without complication, without long-term current use of insulin (HCC) - Plan: CBC with Differential/Platelet, Comprehensive metabolic panel, Lipid panel, pioglitazone (ACTOS) 30 MG tablet, metFORMIN (GLUCOPHAGE) 1000 MG tablet  Morbid obesity (HCC)  Diabetic nephropathy associated with type 2 diabetes mellitus (HCC)  Sleep apnea, obstructive  Hypothyroidism, unspecified type - Plan: TSH, levothyroxine (EUTHYROX) 88 MCG tablet  Status post total replacement of both hips  Hypertension associated with diabetes ([place]) - Plan: CBC with Differential/Platelet, Comprehensive metabolic panel, lisinopril-hydrochlorothiazide (ZESTORETIC) 20-12.5 MG tablet  Hyperlipidemia associated with type 2 diabetes mellitus ([place]) - Plan: Lipid panel, atorvastatin (LIPITOR) 10 MG  tablet  Arthritis associated with diabetes ([place])  Osteopenia, unspecified location  History of gout - Plan: Uric Acid, allopurinol (ZYLOPRIM) 300 MG tablet  She will continue on her present medications.  Discussed increasing her physical activity and she does plan on using a bicycle to help with that as walking is something that she does not enjoy.  No intervention needed for the hips.  Continue on a multivitamin with extra calcium and vitamin D.  No intervention needed for the OSA as she is not interested in CPAP.  Recheck here in roughly 6 months.  Also  discussed the need for her to periodically check her blood sugars even though her A1c was quite good.

## 2019-08-29 NOTE — Patient Instructions (Signed)
20 minutes of something physical daily or 150 minutes week of something physical

## 2019-08-30 LAB — CBC WITH DIFFERENTIAL/PLATELET
Basophils Absolute: 0.1 10*3/uL (ref 0.0–0.2)
Basos: 1 %
EOS (ABSOLUTE): 0.2 10*3/uL (ref 0.0–0.4)
Eos: 2 %
Hematocrit: 39.5 % (ref 34.0–46.6)
Hemoglobin: 13.6 g/dL (ref 11.1–15.9)
Immature Grans (Abs): 0 10*3/uL (ref 0.0–0.1)
Immature Granulocytes: 0 %
Lymphocytes Absolute: 2.4 10*3/uL (ref 0.7–3.1)
Lymphs: 28 %
MCH: 32.2 pg (ref 26.6–33.0)
MCHC: 34.4 g/dL (ref 31.5–35.7)
MCV: 93 fL (ref 79–97)
Monocytes Absolute: 0.6 10*3/uL (ref 0.1–0.9)
Monocytes: 7 %
Neutrophils Absolute: 5.4 10*3/uL (ref 1.4–7.0)
Neutrophils: 62 %
Platelets: 256 10*3/uL (ref 150–450)
RBC: 4.23 x10E6/uL (ref 3.77–5.28)
RDW: 13.1 % (ref 11.7–15.4)
WBC: 8.6 10*3/uL (ref 3.4–10.8)

## 2019-08-30 LAB — COMPREHENSIVE METABOLIC PANEL
ALT: 15 IU/L (ref 0–32)
AST: 18 IU/L (ref 0–40)
Albumin/Globulin Ratio: 1.7 (ref 1.2–2.2)
Albumin: 4.5 g/dL (ref 3.7–4.7)
Alkaline Phosphatase: 80 IU/L (ref 48–121)
BUN/Creatinine Ratio: 17 (ref 12–28)
BUN: 22 mg/dL (ref 8–27)
Bilirubin Total: 0.5 mg/dL (ref 0.0–1.2)
CO2: 21 mmol/L (ref 20–29)
Calcium: 11.5 mg/dL — ABNORMAL HIGH (ref 8.7–10.3)
Chloride: 97 mmol/L (ref 96–106)
Creatinine, Ser: 1.26 mg/dL — ABNORMAL HIGH (ref 0.57–1.00)
GFR calc Af Amer: 49 mL/min/{1.73_m2} — ABNORMAL LOW (ref >59)
GFR calc non Af Amer: 42 mL/min/{1.73_m2} — ABNORMAL LOW (ref >59)
Globulin, Total: 2.7 g/dL (ref 1.5–4.5)
Glucose: 126 mg/dL — ABNORMAL HIGH (ref 65–99)
Potassium: 4.2 mmol/L (ref 3.5–5.2)
Sodium: 137 mmol/L (ref 134–144)
Total Protein: 7.2 g/dL (ref 6.0–8.5)

## 2019-08-30 LAB — LIPID PANEL
Chol/HDL Ratio: 2.6 ratio (ref 0.0–4.4)
Cholesterol, Total: 145 mg/dL (ref 100–199)
HDL: 56 mg/dL (ref >39)
LDL Chol Calc (NIH): 66 mg/dL (ref 0–99)
Triglycerides: 129 mg/dL (ref 0–149)
VLDL Cholesterol Cal: 23 mg/dL (ref 5–40)

## 2019-08-30 LAB — TSH: TSH: 3.75 u[IU]/mL (ref 0.450–4.500)

## 2019-08-30 LAB — URIC ACID: Uric Acid: 4.4 mg/dL (ref 3.1–7.9)

## 2019-09-25 DIAGNOSIS — D225 Melanocytic nevi of trunk: Secondary | ICD-10-CM | POA: Diagnosis not present

## 2019-09-25 DIAGNOSIS — L821 Other seborrheic keratosis: Secondary | ICD-10-CM | POA: Diagnosis not present

## 2019-09-25 DIAGNOSIS — L814 Other melanin hyperpigmentation: Secondary | ICD-10-CM | POA: Diagnosis not present

## 2019-09-25 DIAGNOSIS — D485 Neoplasm of uncertain behavior of skin: Secondary | ICD-10-CM | POA: Diagnosis not present

## 2019-09-25 DIAGNOSIS — Z85828 Personal history of other malignant neoplasm of skin: Secondary | ICD-10-CM | POA: Diagnosis not present

## 2019-09-25 DIAGNOSIS — Z86018 Personal history of other benign neoplasm: Secondary | ICD-10-CM | POA: Diagnosis not present

## 2019-09-25 DIAGNOSIS — L820 Inflamed seborrheic keratosis: Secondary | ICD-10-CM | POA: Diagnosis not present

## 2019-09-25 DIAGNOSIS — L578 Other skin changes due to chronic exposure to nonionizing radiation: Secondary | ICD-10-CM | POA: Diagnosis not present

## 2019-09-25 DIAGNOSIS — L570 Actinic keratosis: Secondary | ICD-10-CM | POA: Diagnosis not present

## 2019-09-25 HISTORY — PX: OTHER SURGICAL HISTORY: SHX169

## 2019-10-18 ENCOUNTER — Encounter: Payer: Self-pay | Admitting: Family Medicine

## 2019-11-05 ENCOUNTER — Ambulatory Visit: Payer: Self-pay | Admitting: Family Medicine

## 2019-11-12 DIAGNOSIS — M25561 Pain in right knee: Secondary | ICD-10-CM | POA: Diagnosis not present

## 2019-11-12 DIAGNOSIS — M170 Bilateral primary osteoarthritis of knee: Secondary | ICD-10-CM | POA: Diagnosis not present

## 2019-11-20 ENCOUNTER — Other Ambulatory Visit: Payer: Self-pay | Admitting: Family Medicine

## 2019-11-20 DIAGNOSIS — E1169 Type 2 diabetes mellitus with other specified complication: Secondary | ICD-10-CM

## 2019-11-20 DIAGNOSIS — E785 Hyperlipidemia, unspecified: Secondary | ICD-10-CM

## 2019-11-20 DIAGNOSIS — E1159 Type 2 diabetes mellitus with other circulatory complications: Secondary | ICD-10-CM

## 2019-11-21 DIAGNOSIS — M170 Bilateral primary osteoarthritis of knee: Secondary | ICD-10-CM | POA: Diagnosis not present

## 2019-11-21 DIAGNOSIS — M25561 Pain in right knee: Secondary | ICD-10-CM | POA: Diagnosis not present

## 2019-11-27 DIAGNOSIS — M25561 Pain in right knee: Secondary | ICD-10-CM | POA: Diagnosis not present

## 2019-11-27 DIAGNOSIS — M170 Bilateral primary osteoarthritis of knee: Secondary | ICD-10-CM | POA: Diagnosis not present

## 2019-12-08 DIAGNOSIS — Z23 Encounter for immunization: Secondary | ICD-10-CM | POA: Diagnosis not present

## 2019-12-27 DIAGNOSIS — Z23 Encounter for immunization: Secondary | ICD-10-CM | POA: Diagnosis not present

## 2019-12-31 DIAGNOSIS — H43813 Vitreous degeneration, bilateral: Secondary | ICD-10-CM | POA: Diagnosis not present

## 2019-12-31 DIAGNOSIS — H52203 Unspecified astigmatism, bilateral: Secondary | ICD-10-CM | POA: Diagnosis not present

## 2019-12-31 DIAGNOSIS — H5203 Hypermetropia, bilateral: Secondary | ICD-10-CM | POA: Diagnosis not present

## 2019-12-31 DIAGNOSIS — E119 Type 2 diabetes mellitus without complications: Secondary | ICD-10-CM | POA: Diagnosis not present

## 2019-12-31 LAB — HM DIABETES EYE EXAM

## 2020-01-08 ENCOUNTER — Other Ambulatory Visit (INDEPENDENT_AMBULATORY_CARE_PROVIDER_SITE_OTHER): Payer: Medicare Other

## 2020-01-08 ENCOUNTER — Other Ambulatory Visit: Payer: Self-pay

## 2020-01-08 DIAGNOSIS — M8580 Other specified disorders of bone density and structure, unspecified site: Secondary | ICD-10-CM

## 2020-01-08 MED ORDER — DENOSUMAB 60 MG/ML ~~LOC~~ SOSY
60.0000 mg | PREFILLED_SYRINGE | Freq: Once | SUBCUTANEOUS | Status: AC
Start: 2020-01-08 — End: 2020-01-08
  Administered 2020-01-08: 60 mg via SUBCUTANEOUS

## 2020-01-09 ENCOUNTER — Encounter: Payer: Self-pay | Admitting: Family Medicine

## 2020-02-17 ENCOUNTER — Other Ambulatory Visit: Payer: Self-pay | Admitting: Family Medicine

## 2020-02-17 DIAGNOSIS — I152 Hypertension secondary to endocrine disorders: Secondary | ICD-10-CM

## 2020-02-17 DIAGNOSIS — E1159 Type 2 diabetes mellitus with other circulatory complications: Secondary | ICD-10-CM

## 2020-02-17 DIAGNOSIS — E785 Hyperlipidemia, unspecified: Secondary | ICD-10-CM

## 2020-02-17 DIAGNOSIS — E1169 Type 2 diabetes mellitus with other specified complication: Secondary | ICD-10-CM

## 2020-03-02 ENCOUNTER — Other Ambulatory Visit: Payer: Self-pay | Admitting: Family Medicine

## 2020-03-02 DIAGNOSIS — E119 Type 2 diabetes mellitus without complications: Secondary | ICD-10-CM

## 2020-03-03 NOTE — Telephone Encounter (Signed)
Pt needs appt. And was mailed a letter and a my chart message. KH

## 2020-03-04 ENCOUNTER — Ambulatory Visit: Payer: Medicare Other | Admitting: Family Medicine

## 2020-04-16 ENCOUNTER — Other Ambulatory Visit: Payer: Self-pay | Admitting: Family Medicine

## 2020-04-16 ENCOUNTER — Ambulatory Visit (INDEPENDENT_AMBULATORY_CARE_PROVIDER_SITE_OTHER): Payer: Medicare HMO | Admitting: Family Medicine

## 2020-04-16 ENCOUNTER — Encounter: Payer: Self-pay | Admitting: Family Medicine

## 2020-04-16 ENCOUNTER — Other Ambulatory Visit: Payer: Self-pay

## 2020-04-16 DIAGNOSIS — E119 Type 2 diabetes mellitus without complications: Secondary | ICD-10-CM

## 2020-04-16 DIAGNOSIS — E039 Hypothyroidism, unspecified: Secondary | ICD-10-CM | POA: Diagnosis not present

## 2020-04-16 DIAGNOSIS — I152 Hypertension secondary to endocrine disorders: Secondary | ICD-10-CM

## 2020-04-16 DIAGNOSIS — E1169 Type 2 diabetes mellitus with other specified complication: Secondary | ICD-10-CM | POA: Diagnosis not present

## 2020-04-16 DIAGNOSIS — E1159 Type 2 diabetes mellitus with other circulatory complications: Secondary | ICD-10-CM

## 2020-04-16 DIAGNOSIS — E785 Hyperlipidemia, unspecified: Secondary | ICD-10-CM

## 2020-04-16 LAB — POCT GLYCOSYLATED HEMOGLOBIN (HGB A1C): Hemoglobin A1C: 5.9 % — AB (ref 4.0–5.6)

## 2020-04-16 NOTE — Patient Instructions (Signed)
Use your silver sneakers and get in 3or 5 times a week

## 2020-04-16 NOTE — Progress Notes (Signed)
  Subjective:    Patient ID: Carla Wright, female    DOB: [birth year], 75 y.o.   MRN: [id number]  Carla Wright is a 75 y.o. female who presents for follow-up of Type 2 diabetes mellitus.  Home blood sugar records: not checking Current symptoms/problems include none at this time . Daily foot checks:yes  Any foot concerns: burning on the botom Exercise: active but not exercise.  Recently silver sneakers became available to her.  Sabas on what she got my mother 7 she was 79 at that point weight loss when we went on [place] for this month and like Carla Wright and his wife Carla Wright and Carla Wright 49 years old and hiked all the [place] it is a 9-1/2 mile hike up 4500 feet and she had a bad need to she injured her knee she is she still hiked out Diet: fair She continues on Metformin as well as pioglitazone, atorvastatin and lisinopril had no difficulty with that.  She has had no gouty outbreaks.  Continues on allopurinol.  Continues on his thyroid medication.  Review of the record indicates she is almost almost 10 pounds in the last year. The following portions of the patient's history were reviewed and updated as appropriate: allergies, current medications, past medical history, past social history and problem list.  ROS as in subjective above.     Objective:    Physical Exam Alert and in no distress otherwise not examined. Hemoglobin A1c is 5.9   Lab Review Diabetic Labs Latest Ref Rng & Units 08/29/2019 04/29/2019 12/13/2018 09/12/2018 11/01/2016  HbA1c 4.0 - 5.6 % - 6.7(A) 6.9(A) 10.4(A) 7.0  Microalbumin mg/L - 216.7 - - 16.8  Micro/Creat Ratio - - 236.1 - - 25.9  Chol 100 - 199 mg/dL 145 - - 139 142  HDL >39 mg/dL 56 - - 42 48(L)  Calc LDL 0 - 99 mg/dL 66 - - 74 74  Triglycerides 0 - 149 mg/dL 129 - - 116 102  Creatinine 0.57 - 1.00 mg/dL 1.26(H) - - 0.99 0.96(H)   BP/Weight 08/29/2019 06/05/2019 05/22/2019 04/29/2019 1/63/8466  Systolic BP 599 357 017 793 -  Diastolic BP 82 72 74 74 -   Wt. (Lbs) 256.6 264.8 267.2 256.2 263  BMI 42.7 44.07 44.46 42.63 43.77   Foot/eye exam completion dates Latest Ref Rng & Units 12/31/2019 08/29/2019  Eye Exam No Retinopathy No Retinopathy -  Foot Form Completion - - Done    Kynlea  reports that she quit smoking about 38 years ago. She has a 15.00 pack-year smoking history. She has never used smokeless tobacco. She reports current alcohol use. She reports that she does not use drugs.     Assessment & Plan:    Morbid obesity ([place])  Type 2 diabetes mellitus without complication, without long-term current use of insulin (HCC)  Hypothyroidism, unspecified type  Hyperlipidemia associated with type 2 diabetes mellitus ([place])  Hypertension associated with diabetes ([place])    1. Rx changes: none I discussed stopping one of her blood sugar medications hello she would like to hold off on doing it. 2. Education: Reviewed 'ABCs' of diabetes management (respective goals in parentheses):  A1C (<7), blood pressure (<130/80), and cholesterol (LDL <100). 3. Compliance at present is estimated to be good. Efforts to improve compliance (if necessary) will be directed at increased exercise.  Encouraged her to use her silver sneakers through her insurance plan 4. Follow up: 4 months

## 2020-05-15 ENCOUNTER — Other Ambulatory Visit: Payer: Self-pay | Admitting: Family Medicine

## 2020-05-15 DIAGNOSIS — E1169 Type 2 diabetes mellitus with other specified complication: Secondary | ICD-10-CM

## 2020-05-15 DIAGNOSIS — E1159 Type 2 diabetes mellitus with other circulatory complications: Secondary | ICD-10-CM

## 2020-07-08 ENCOUNTER — Other Ambulatory Visit: Payer: Medicare HMO

## 2020-07-08 DIAGNOSIS — M810 Age-related osteoporosis without current pathological fracture: Secondary | ICD-10-CM

## 2020-07-08 MED ORDER — DENOSUMAB 60 MG/ML ~~LOC~~ SOSY
60.0000 mg | PREFILLED_SYRINGE | Freq: Once | SUBCUTANEOUS | Status: AC
  Administered 2020-07-08: 60 mg via SUBCUTANEOUS

## 2020-07-14 ENCOUNTER — Other Ambulatory Visit: Payer: Self-pay | Admitting: Family Medicine

## 2020-07-14 DIAGNOSIS — E119 Type 2 diabetes mellitus without complications: Secondary | ICD-10-CM

## 2020-08-14 ENCOUNTER — Other Ambulatory Visit: Payer: Self-pay | Admitting: Family Medicine

## 2020-08-14 DIAGNOSIS — E1169 Type 2 diabetes mellitus with other specified complication: Secondary | ICD-10-CM

## 2020-08-14 DIAGNOSIS — E1159 Type 2 diabetes mellitus with other circulatory complications: Secondary | ICD-10-CM

## 2020-09-01 ENCOUNTER — Ambulatory Visit (INDEPENDENT_AMBULATORY_CARE_PROVIDER_SITE_OTHER): Payer: Medicare HMO | Admitting: Family Medicine

## 2020-09-01 ENCOUNTER — Other Ambulatory Visit: Payer: Self-pay

## 2020-09-01 ENCOUNTER — Encounter: Payer: Self-pay | Admitting: Family Medicine

## 2020-09-01 VITALS — BP 136/84 | HR 91 | Temp 98.5°F | Ht 64.5 in | Wt 260.0 lb

## 2020-09-01 DIAGNOSIS — E119 Type 2 diabetes mellitus without complications: Secondary | ICD-10-CM | POA: Diagnosis not present

## 2020-09-01 DIAGNOSIS — E785 Hyperlipidemia, unspecified: Secondary | ICD-10-CM

## 2020-09-01 DIAGNOSIS — E1121 Type 2 diabetes mellitus with diabetic nephropathy: Secondary | ICD-10-CM

## 2020-09-01 DIAGNOSIS — Z8739 Personal history of other diseases of the musculoskeletal system and connective tissue: Secondary | ICD-10-CM | POA: Diagnosis not present

## 2020-09-01 DIAGNOSIS — M8580 Other specified disorders of bone density and structure, unspecified site: Secondary | ICD-10-CM

## 2020-09-01 DIAGNOSIS — I152 Hypertension secondary to endocrine disorders: Secondary | ICD-10-CM

## 2020-09-01 DIAGNOSIS — E1159 Type 2 diabetes mellitus with other circulatory complications: Secondary | ICD-10-CM

## 2020-09-01 DIAGNOSIS — Z1211 Encounter for screening for malignant neoplasm of colon: Secondary | ICD-10-CM

## 2020-09-01 DIAGNOSIS — E039 Hypothyroidism, unspecified: Secondary | ICD-10-CM | POA: Diagnosis not present

## 2020-09-01 DIAGNOSIS — Z0000 Encounter for general adult medical examination without abnormal findings: Secondary | ICD-10-CM | POA: Diagnosis not present

## 2020-09-01 DIAGNOSIS — E11618 Type 2 diabetes mellitus with other diabetic arthropathy: Secondary | ICD-10-CM

## 2020-09-01 DIAGNOSIS — E1169 Type 2 diabetes mellitus with other specified complication: Secondary | ICD-10-CM

## 2020-09-01 DIAGNOSIS — Z96643 Presence of artificial hip joint, bilateral: Secondary | ICD-10-CM

## 2020-09-01 LAB — POCT GLYCOSYLATED HEMOGLOBIN (HGB A1C): Hemoglobin A1C: 5.8 % — AB (ref 4.0–5.6)

## 2020-09-01 LAB — POCT UA - MICROALBUMIN
Albumin/Creatinine Ratio, Urine, POC: 8.9
Creatinine, POC: 153.5 mg/dL
Microalbumin Ur, POC: 13.6 mg/L

## 2020-09-01 MED ORDER — ATORVASTATIN CALCIUM 10 MG PO TABS: 10.0000 mg | ORAL_TABLET | Freq: Every day | ORAL | 3 refills | Status: DC

## 2020-09-01 MED ORDER — ALLOPURINOL 300 MG PO TABS: 300.0000 mg | ORAL_TABLET | Freq: Every day | ORAL | 3 refills | Status: DC

## 2020-09-01 MED ORDER — LEVOTHYROXINE SODIUM 88 MCG PO TABS: 88.0000 ug | ORAL_TABLET | Freq: Every day | ORAL | 3 refills | Status: DC

## 2020-09-01 MED ORDER — LISINOPRIL-HYDROCHLOROTHIAZIDE 20-12.5 MG PO TABS
1.0000 | ORAL_TABLET | Freq: Every day | ORAL | 3 refills | Status: DC
Start: 2020-09-01 — End: 2021-09-10

## 2020-09-01 MED ORDER — METFORMIN HCL 1000 MG PO TABS: 1000.0000 mg | ORAL_TABLET | Freq: Two times a day (BID) | ORAL | 1 refills | Status: DC

## 2020-09-01 NOTE — Progress Notes (Signed)
Carla Wright is a 75 y.o. female who presents for annual wellness visit CPE and follow-up on chronic medical conditions.  She does not exercise regularly and blames her back on that but also states she does not like to exercise.  She continues on atorvastatin and lisinopril/HCTZ without difficulty.  Is also taking metformin and Actos.  Continues on allopurinol as well as thyroid medication.  She is having no difficulty with any of these.  Review of the record indicates need for follow-up on her osteopenia.  Immunizations and Health Maintenance Immunization History  Administered Date(s) Administered   Fluad Quad(high Dose 65+) 12/13/2018   Influenza Split 11/28/2011   Influenza, High Dose Seasonal PF 01/23/2013, 01/13/2014, 11/01/2016, 12/25/2017   PFIZER(Purple Top)SARS-COV-2 Vaccination 04/04/2019, 04/29/2019, 12/08/2019, 07/23/2020   Pneumococcal Conjugate-13 09/09/2013   Pneumococcal Polysaccharide-23 03/20/2009   Tdap 02/20/2007, 01/10/2015   Zoster, Live 02/20/2007   Health Maintenance Due  Topic Date Due   Zoster Vaccines- Shingrix (1 of 2) Never done   COLONOSCOPY (Pts 45-17yrs Insurance coverage will need to be confirmed)  02/29/2020    Last Pap smear: aged out  Last mammogram: 10/09/2018 Last colonoscopy: 02/28/2010 Last DEXA:10/09/2018 Dentist: Q year Ophtho: Q year Exercise: N/A  Other doctors caring for patient include: Carla Wright eye  Advanced directives: Does Patient Have a Medical Advance Directive?: No Would patient like information on creating a medical advance directive?: Yes (ED - Information included in AVS)  Depression screen:  See questionnaire below.  Depression screen [name] 2/9 09/01/2020 08/29/2019 12/13/2018 09/12/2018 09/12/2018  Decreased Interest 0 0 0 0 0  Down, Depressed, Hopeless 0 0 0 0 0  PHQ - 2 Score 0 0 0 0 0    Fall Risk Screen: see questionnaire below. Fall Risk  09/01/2020 08/29/2019 12/13/2018 10/01/2018 09/12/2018  Falls in the past year? 1 0 0 0 0   Comment - - - Emmi Telephone Survey: data to providers prior to load -  Number falls in past yr: 0 - - - -  Injury with Fall? 0 - - - -  Risk for fall due to : Impaired mobility - - - -  Follow up Falls evaluation completed - - - -    ADL screen:  See questionnaire below Functional Status Survey: Is the patient deaf or have difficulty hearing?: No Does the patient have difficulty seeing, even when wearing glasses/contacts?: No Does the patient have difficulty concentrating, remembering, or making decisions?: No Does the patient have difficulty walking or climbing stairs?: Yes (back and both knees) Does the patient have difficulty dressing or bathing?: No Does the patient have difficulty doing errands alone such as visiting a doctor's office or shopping?: No   Review of Systems Constitutional: -, -unexpected weight change, -anorexia, -fatigue Allergy: -sneezing, -itching, -congestion Dermatology: denies changing moles, rash, lumps ENT: -runny nose, -ear pain, -sore throat,  Cardiology:  -chest pain, -palpitations, -orthopnea, Respiratory: -cough, -shortness of breath, -dyspnea on exertion, -wheezing,  Gastroenterology: -abdominal pain, -nausea, -vomiting, -diarrhea, -constipation, -dysphagia Hematology: -bleeding or bruising problems Musculoskeletal: -arthralgias, -myalgias, -joint swelling, -back pain, - Ophthalmology: -vision changes,  Urology: -dysuria, -difficulty urinating,  -urinary frequency, -urgency, incontinence Neurology: -, -numbness, , -memory loss, -falls, -dizziness    PHYSICAL EXAM: General Appearance: Alert, cooperative, no distress, appears stated age Head: Normocephalic, without obvious abnormality, atraumatic Eyes: PERRL, conjunctiva/corneas clear, EOM's intact. Ears: Normal TM's and external ear canals Nose: Nares normal, mucosa normal, no drainage or sinus tenderness Throat: Lips, mucosa, and tongue normal; teeth  and gums normal Neck: Supple, no  lymphadenopathy;  thyroid:  no enlargement/tenderness/nodules; no carotid bruit or JVD Lungs: Clear to auscultation bilaterally without wheezes, rales or ronchi; respirations unlabored Heart: Regular rate and rhythm, S1 and S2 normal, no murmur, rubor gallop Abdomen: Soft, non-tender, nondistended, normoactive bowel sounds,  no masses, no hepatosplenomegaly  Skin:  Skin color, texture, turgor normal, no rashes or lesions Lymph nodes: Cervical, supraclavicular, and axillary nodes normal Neurologic:  CNII-XII intact, normal strength, sensation and gait; reflexes 2+ and symmetric throughout Psych: Normal mood, affect, hygiene and grooming. Hemoglobin A1c is 5.8. ASSESSMENT/PLAN: Routine general medical examination at a health care facility - Plan: CBC with Differential/Platelet, Comprehensive metabolic panel, Lipid panel  History of gout - Plan: Uric Acid, allopurinol (ZYLOPRIM) 300 MG tablet  Hyperlipidemia associated with type 2 diabetes mellitus ([place]) - Plan: Lipid panel, atorvastatin (LIPITOR) 10 MG tablet  Hypothyroidism, unspecified type - Plan: TSH, levothyroxine (EUTHYROX) 88 MCG tablet  Hypertension associated with diabetes ([place]) - Plan: lisinopril-hydrochlorothiazide (ZESTORETIC) 20-12.5 MG tablet  Type 2 diabetes mellitus without complication, without long-term current use of insulin (HCC) - Plan: CBC with Differential/Platelet, Comprehensive metabolic panel, Lipid panel, POCT glycosylated hemoglobin (Hb A1C), POCT UA - Microalbumin, metFORMIN (GLUCOPHAGE) 1000 MG tablet  Morbid obesity (HCC)  Status post total replacement of both hips  Diabetic nephropathy associated with type 2 diabetes mellitus (HCC)  Arthritis associated with diabetes (HCC)  Osteopenia, unspecified location - Plan: DG Bone Density  Screening for colon cancer - Plan: Cologuard Discussed possible follow-up with x-rays on her back and potential referral for physical therapy for that.  She was reluctant to  do that and was planning on making changes in her diet and exercise on her own.  Again strongly encouraged her to become more physically active. I will have her stop her Actos as she does seem to be doing quite nicely with her A1c.  Discussed at least 30 minutes of aerobic activity at least 5 days/week and weight-bearing exercise 2x/week;   Immunization recommendations discussed.  Colonoscopy recommendations reviewed Did encourage her to go ahead and get the Shingrix vaccine.  Medicare Attestation I have personally reviewed: The patient's medical and social history Their use of alcohol, tobacco or illicit drugs Their current medications and supplements The patient's functional ability including ADLs,fall risks, home safety risks, cognitive, and hearing and visual impairment Diet and physical activities Evidence for depression or mood disorders  The patient's weight, height, and BMI have been recorded in the chart.  I have made referrals, counseling, and provided education to the patient based on review of the above and I have provided the patient with a written personalized care plan for preventive services.     Jill Alexanders, MD   09/01/2020

## 2020-09-02 LAB — COMPREHENSIVE METABOLIC PANEL
ALT: 9 IU/L (ref 0–32)
AST: 12 IU/L (ref 0–40)
Albumin/Globulin Ratio: 1.8 (ref 1.2–2.2)
Albumin: 4.4 g/dL (ref 3.7–4.7)
Alkaline Phosphatase: 80 IU/L (ref 44–121)
BUN/Creatinine Ratio: 17 (ref 12–28)
BUN: 22 mg/dL (ref 8–27)
Bilirubin Total: 0.3 mg/dL (ref 0.0–1.2)
CO2: 22 mmol/L (ref 20–29)
Calcium: 10 mg/dL (ref 8.7–10.3)
Chloride: 98 mmol/L (ref 96–106)
Creatinine, Ser: 1.27 mg/dL — ABNORMAL HIGH (ref 0.57–1.00)
Globulin, Total: 2.4 g/dL (ref 1.5–4.5)
Glucose: 127 mg/dL — ABNORMAL HIGH (ref 65–99)
Potassium: 4.7 mmol/L (ref 3.5–5.2)
Sodium: 140 mmol/L (ref 134–144)
Total Protein: 6.8 g/dL (ref 6.0–8.5)
eGFR: 44 mL/min/{1.73_m2} — ABNORMAL LOW (ref >59)

## 2020-09-02 LAB — CBC WITH DIFFERENTIAL/PLATELET
Basophils Absolute: 0 10*3/uL (ref 0.0–0.2)
Basos: 1 %
EOS (ABSOLUTE): 0.2 10*3/uL (ref 0.0–0.4)
Eos: 2 %
Hematocrit: 38.1 % (ref 34.0–46.6)
Hemoglobin: 12.5 g/dL (ref 11.1–15.9)
Immature Grans (Abs): 0 10*3/uL (ref 0.0–0.1)
Immature Granulocytes: 1 %
Lymphocytes Absolute: 2.3 10*3/uL (ref 0.7–3.1)
Lymphs: 27 %
MCH: 31.7 pg (ref 26.6–33.0)
MCHC: 32.8 g/dL (ref 31.5–35.7)
MCV: 97 fL (ref 79–97)
Monocytes Absolute: 0.6 10*3/uL (ref 0.1–0.9)
Monocytes: 7 %
Neutrophils Absolute: 5.3 10*3/uL (ref 1.4–7.0)
Neutrophils: 62 %
Platelets: 239 10*3/uL (ref 150–450)
RBC: 3.94 x10E6/uL (ref 3.77–5.28)
RDW: 13.1 % (ref 11.7–15.4)
WBC: 8.4 10*3/uL (ref 3.4–10.8)

## 2020-09-02 LAB — TSH: TSH: 3.23 u[IU]/mL (ref 0.450–4.500)

## 2020-09-02 LAB — LIPID PANEL
Chol/HDL Ratio: 2.6 ratio (ref 0.0–4.4)
Cholesterol, Total: 138 mg/dL (ref 100–199)
HDL: 54 mg/dL (ref >39)
LDL Chol Calc (NIH): 67 mg/dL (ref 0–99)
Triglycerides: 93 mg/dL (ref 0–149)
VLDL Cholesterol Cal: 17 mg/dL (ref 5–40)

## 2020-09-02 LAB — URIC ACID: Uric Acid: 4.3 mg/dL (ref 3.1–7.9)

## 2020-09-14 LAB — COLOGUARD: Cologuard: NEGATIVE

## 2020-09-18 LAB — COLOGUARD: COLOGUARD: NEGATIVE

## 2020-09-18 LAB — HM DEXA SCAN

## 2020-09-18 LAB — HM MAMMOGRAPHY

## 2020-09-24 ENCOUNTER — Encounter: Payer: Self-pay | Admitting: Family Medicine

## 2020-09-28 NOTE — Progress Notes (Signed)
LVM for pt concerning bone density. Pt was advised to call back if she has questions [facility]

## 2020-11-06 DIAGNOSIS — C44311 Basal cell carcinoma of skin of nose: Secondary | ICD-10-CM | POA: Insufficient documentation

## 2020-11-27 DIAGNOSIS — D0439 Carcinoma in situ of skin of other parts of face: Secondary | ICD-10-CM | POA: Insufficient documentation

## 2020-11-27 DIAGNOSIS — D044 Carcinoma in situ of skin of scalp and neck: Secondary | ICD-10-CM | POA: Insufficient documentation

## 2020-12-09 DIAGNOSIS — C44311 Basal cell carcinoma of skin of nose: Secondary | ICD-10-CM | POA: Diagnosis not present

## 2020-12-31 DIAGNOSIS — H43813 Vitreous degeneration, bilateral: Secondary | ICD-10-CM | POA: Diagnosis not present

## 2020-12-31 DIAGNOSIS — H524 Presbyopia: Secondary | ICD-10-CM | POA: Diagnosis not present

## 2020-12-31 DIAGNOSIS — H2513 Age-related nuclear cataract, bilateral: Secondary | ICD-10-CM | POA: Diagnosis not present

## 2020-12-31 DIAGNOSIS — E119 Type 2 diabetes mellitus without complications: Secondary | ICD-10-CM | POA: Diagnosis not present

## 2020-12-31 LAB — HM DIABETES EYE EXAM

## 2021-01-01 ENCOUNTER — Encounter: Payer: Self-pay | Admitting: Family Medicine

## 2021-01-06 ENCOUNTER — Telehealth: Payer: Self-pay | Admitting: Family Medicine

## 2021-01-06 NOTE — Telephone Encounter (Signed)
Left message for pt to call. I need to speak to her concerning her next Prolia injection.  Pt was approved with estimated cost of 20% and 20%

## 2021-01-13 ENCOUNTER — Other Ambulatory Visit: Payer: Medicare HMO

## 2021-01-13 ENCOUNTER — Other Ambulatory Visit: Payer: Self-pay

## 2021-01-13 DIAGNOSIS — M810 Age-related osteoporosis without current pathological fracture: Secondary | ICD-10-CM | POA: Diagnosis not present

## 2021-01-13 MED ORDER — DENOSUMAB 60 MG/ML ~~LOC~~ SOSY
60.0000 mg | PREFILLED_SYRINGE | Freq: Once | SUBCUTANEOUS | Status: AC
  Administered 2021-01-13: 60 mg via SUBCUTANEOUS

## 2021-03-03 ENCOUNTER — Telehealth: Payer: Self-pay

## 2021-03-03 ENCOUNTER — Other Ambulatory Visit: Payer: Self-pay

## 2021-03-03 DIAGNOSIS — E039 Hypothyroidism, unspecified: Secondary | ICD-10-CM

## 2021-03-03 MED ORDER — LEVOTHYROXINE SODIUM 88 MCG PO TABS: 88.0000 ug | ORAL_TABLET | Freq: Every day | ORAL | 3 refills | Status: DC

## 2021-03-03 NOTE — Telephone Encounter (Signed)
Pharmacists from [facility] called stating they received the fax stating that you did not want to change the pts. Levothyroxine to a different brand. She said that [facility] does not carry that brand she is currently on anymore so she would have to get it at a different pharmacy.

## 2021-03-08 ENCOUNTER — Other Ambulatory Visit: Payer: Self-pay

## 2021-03-08 ENCOUNTER — Encounter: Payer: Self-pay | Admitting: Family Medicine

## 2021-03-08 ENCOUNTER — Ambulatory Visit (INDEPENDENT_AMBULATORY_CARE_PROVIDER_SITE_OTHER): Payer: Medicare HMO | Admitting: Family Medicine

## 2021-03-08 VITALS — BP 130/74 | HR 96 | Temp 98.5°F | Wt 249.0 lb

## 2021-03-08 DIAGNOSIS — Z8739 Personal history of other diseases of the musculoskeletal system and connective tissue: Secondary | ICD-10-CM

## 2021-03-08 DIAGNOSIS — E11618 Type 2 diabetes mellitus with other diabetic arthropathy: Secondary | ICD-10-CM | POA: Diagnosis not present

## 2021-03-08 DIAGNOSIS — E1159 Type 2 diabetes mellitus with other circulatory complications: Secondary | ICD-10-CM

## 2021-03-08 DIAGNOSIS — I152 Hypertension secondary to endocrine disorders: Secondary | ICD-10-CM

## 2021-03-08 DIAGNOSIS — E785 Hyperlipidemia, unspecified: Secondary | ICD-10-CM | POA: Diagnosis not present

## 2021-03-08 DIAGNOSIS — E039 Hypothyroidism, unspecified: Secondary | ICD-10-CM | POA: Diagnosis not present

## 2021-03-08 DIAGNOSIS — Z23 Encounter for immunization: Secondary | ICD-10-CM | POA: Diagnosis not present

## 2021-03-08 DIAGNOSIS — E1169 Type 2 diabetes mellitus with other specified complication: Secondary | ICD-10-CM | POA: Diagnosis not present

## 2021-03-08 LAB — POCT GLYCOSYLATED HEMOGLOBIN (HGB A1C): Hemoglobin A1C: 6.4 % — AB (ref 4.0–5.6)

## 2021-03-08 NOTE — Progress Notes (Signed)
Subjective:    Patient ID: Carla Wright, female    DOB: 10/14/1945, 76 y.o.   MRN: [id number]  Carla Wright is a 76 y.o. female who presents for follow-up of Type 2 diabetes mellitus.  Home blood sugar records:  not checking  Current symptoms/problems include none and have been stable. Daily foot checks: yes   Any foot concerns: none Exercise: The patient does not participate in regular exercise at present. Diet: fair She continues on metformin and Actos and is having no difficulty with that.  Also continues on her thyroid medicine as well as Lipitor and Zyloprim.  Has no complaints concerning these medications.  She does have arthritis and is considering further intervention but notes the need that she needs to lose weight.  So far she has been able to do that. The following portions of the patient's history were reviewed and updated as appropriate: allergies, current medications, past medical history, past social history and problem list.  ROS as in subjective above.     Objective:    Physical Exam Alert and in no distress otherwise not examined.  Blood pressure 130/74, pulse 96, temperature 98.5 F (36.9 C), weight 249 lb (112.9 kg), SpO2 98 %.  Lab Review Diabetic Labs Latest Ref Rng & Units 03/08/2021 09/01/2020 04/16/2020 08/29/2019 04/29/2019  HbA1c 4.0 - 5.6 % 6.4(A) 5.8(A) 5.9(A) 6.0 6.7(A)  Microalbumin mg/L - 13.6 - - 216.7  Micro/Creat Ratio - - 8.9 - - 236.1  Chol 100 - 199 mg/dL - 138 - 145 -  HDL >39 mg/dL - 54 - 56 -  Calc LDL 0 - 99 mg/dL - 67 - 66 -  Triglycerides 0 - 149 mg/dL - 93 - 129 -  Creatinine 0.57 - 1.00 mg/dL - 1.27(H) - 1.26(H) -   BP/Weight 03/08/2021 09/01/2020 04/16/2020 0/04/7251 08/03/4401  Systolic BP 474 259 563 875 643  Diastolic BP 74 84 76 82 72  Wt. (Lbs) 249 260 246.4 256.6 264.8  BMI 42.08 43.94 41 42.7 44.07   Foot/eye exam completion dates Latest Ref Rng & Units 12/31/2020 09/01/2020  Eye Exam No Retinopathy No Retinopathy -  Foot Form Completion -  - Done  Hemoglobin A1c is now 6.4  Carla Wright  reports that she quit smoking about 39 years ago. Her smoking use included cigarettes. She has a 15.00 pack-year smoking history. She has never used smokeless tobacco. She reports current alcohol use. She reports that she does not use drugs.     Assessment & Plan:    Type 2 diabetes mellitus with other diabetic arthropathy, without long-term current use of insulin ([place]) - Plan: POCT glycosylated hemoglobin (Hb A1C)  Need for COVID-19 vaccine - Plan: Pfizer Covid-19 Vaccine Bivalent Booster  Hypothyroidism, unspecified type  History of gout  Hyperlipidemia associated with type 2 diabetes mellitus ([place])  Hypertension associated with diabetes ([place])  Morbid obesity ([place])  Arthritis associated with diabetes ([place])  Rx changes: none Education: Reviewed ABCs of diabetes management (respective goals in parentheses):  A1C (<7), blood pressure (<130/80), and cholesterol (LDL <100). Compliance at present is estimated to be fair. Efforts to improve compliance (if necessary) will be directed at increased exercise. Follow up: 4 months Long discussion with her concerning her diet and exercise.  Strongly [facility] especially [facility] physical activities to take the pressure off of her knees.  Also discussed need for making further changes in her diet especially in regard to carbohydrates.  Hopefully the need for further orthopedic surgery might make her more willing to put effort into this.

## 2021-03-08 NOTE — Patient Instructions (Signed)
It is time for you to join the [facility]

## 2021-04-19 ENCOUNTER — Telehealth: Payer: Self-pay | Admitting: Family Medicine

## 2021-04-19 ENCOUNTER — Other Ambulatory Visit: Payer: Self-pay

## 2021-04-19 DIAGNOSIS — E119 Type 2 diabetes mellitus without complications: Secondary | ICD-10-CM

## 2021-04-19 MED ORDER — METFORMIN HCL 1000 MG PO TABS: 1000.0000 mg | ORAL_TABLET | Freq: Two times a day (BID) | ORAL | 1 refills | Status: DC

## 2021-04-19 NOTE — Telephone Encounter (Signed)
Done KH 

## 2021-04-19 NOTE — Telephone Encounter (Signed)
Pt called and is requesting a refill on her metformin please send to the  [address] [place], [place] [address]

## 2021-04-23 DIAGNOSIS — Z08 Encounter for follow-up examination after completed treatment for malignant neoplasm: Secondary | ICD-10-CM | POA: Diagnosis not present

## 2021-04-23 DIAGNOSIS — Z85828 Personal history of other malignant neoplasm of skin: Secondary | ICD-10-CM | POA: Diagnosis not present

## 2021-04-23 DIAGNOSIS — L570 Actinic keratosis: Secondary | ICD-10-CM | POA: Diagnosis not present

## 2021-04-23 DIAGNOSIS — L814 Other melanin hyperpigmentation: Secondary | ICD-10-CM | POA: Diagnosis not present

## 2021-04-23 DIAGNOSIS — D225 Melanocytic nevi of trunk: Secondary | ICD-10-CM | POA: Diagnosis not present

## 2021-04-23 DIAGNOSIS — Z86007 Personal history of in-situ neoplasm of skin: Secondary | ICD-10-CM | POA: Diagnosis not present

## 2021-04-23 DIAGNOSIS — L821 Other seborrheic keratosis: Secondary | ICD-10-CM | POA: Diagnosis not present

## 2021-07-07 ENCOUNTER — Telehealth: Payer: Self-pay | Admitting: Family Medicine

## 2021-07-07 NOTE — Telephone Encounter (Signed)
Left a message for pt to call back concerning Prolia ?

## 2021-07-14 ENCOUNTER — Other Ambulatory Visit: Payer: Medicare HMO

## 2021-07-15 ENCOUNTER — Other Ambulatory Visit: Payer: Medicare HMO

## 2021-07-15 DIAGNOSIS — M810 Age-related osteoporosis without current pathological fracture: Secondary | ICD-10-CM | POA: Diagnosis not present

## 2021-07-15 MED ORDER — DENOSUMAB 60 MG/ML ~~LOC~~ SOSY
60.0000 mg | PREFILLED_SYRINGE | Freq: Once | SUBCUTANEOUS | Status: AC
  Administered 2021-07-15: 60 mg via SUBCUTANEOUS

## 2021-07-24 DIAGNOSIS — E039 Hypothyroidism, unspecified: Secondary | ICD-10-CM | POA: Diagnosis not present

## 2021-07-24 DIAGNOSIS — I10 Essential (primary) hypertension: Secondary | ICD-10-CM | POA: Diagnosis not present

## 2021-07-24 DIAGNOSIS — M109 Gout, unspecified: Secondary | ICD-10-CM | POA: Diagnosis not present

## 2021-07-24 DIAGNOSIS — E118 Type 2 diabetes mellitus with unspecified complications: Secondary | ICD-10-CM | POA: Diagnosis not present

## 2021-07-24 DIAGNOSIS — M542 Cervicalgia: Secondary | ICD-10-CM | POA: Diagnosis not present

## 2021-09-03 ENCOUNTER — Ambulatory Visit (INDEPENDENT_AMBULATORY_CARE_PROVIDER_SITE_OTHER): Payer: Medicare HMO

## 2021-09-03 VITALS — Ht 65.0 in | Wt 247.0 lb

## 2021-09-03 DIAGNOSIS — Z0000 Encounter for general adult medical examination without abnormal findings: Secondary | ICD-10-CM | POA: Diagnosis not present

## 2021-09-03 NOTE — Patient Instructions (Signed)
Carla Wright , Thank you for taking time to come for your Medicare Wellness Visit. I appreciate your ongoing commitment to your health goals. Please review the following plan we discussed and let me know if I can assist you in the future.   Screening recommendations/referrals: Colonoscopy: cologuard 09/14/2020, due 09/15/2023 Mammogram: completed 09/18/2020, due 09/20/2022 Bone Density: completed 09/18/2020 Recommended yearly ophthalmology/optometry visit for glaucoma screening and checkup Recommended yearly dental visit for hygiene and checkup  Vaccinations: Influenza vaccine: due 09/28/2021 Pneumococcal vaccine: completed 09/09/2013 Tdap vaccine: completed 01/10/2015, due 01/09/2025 Shingles vaccine: completed   Covid-19: 03/08/2021, 07/23/2020, 12/08/2019, 04/29/2019, 04/04/2019  Advanced directives: Advance directive discussed with you today.   Conditions/risks identified: none  Next appointment: Follow up in one year for your annual wellness visit    Preventive Care 65 Years and Older, Female Preventive care refers to lifestyle choices and visits with your health care provider that can promote health and wellness. What does preventive care include? A yearly physical exam. This is also called an annual well check. Dental exams once or twice a year. Routine eye exams. Ask your health care provider how often you should have your eyes checked. Personal lifestyle choices, including: Daily care of your teeth and gums. Regular physical activity. Eating a healthy diet. Avoiding tobacco and drug use. Limiting alcohol use. Practicing safe sex. Taking low-dose aspirin every day. Taking vitamin and mineral supplements as recommended by your health care provider. What happens during an annual well check? The services and screenings done by your health care provider during your annual well check will depend on your age, overall health, lifestyle risk factors, and family history of disease. Counseling   Your health care provider may ask you questions about your: Alcohol use. Tobacco use. Drug use. Emotional well-being. Home and relationship well-being. Sexual activity. Eating habits. History of falls. Memory and ability to understand (cognition). Work and work Statistician. Reproductive health. Screening  You may have the following tests or measurements: Height, weight, and BMI. Blood pressure. Lipid and cholesterol levels. These may be checked every 5 years, or more frequently if you are over 66 years old. Skin check. Lung cancer screening. You may have this screening every year starting at age 10 if you have a 30-pack-year history of smoking and currently smoke or have quit within the past 15 years. Fecal occult blood test (FOBT) of the stool. You may have this test every year starting at age 71. Flexible sigmoidoscopy or colonoscopy. You may have a sigmoidoscopy every 5 years or a colonoscopy every 10 years starting at age 52. Hepatitis C blood test. Hepatitis B blood test. Sexually transmitted disease (STD) testing. Diabetes screening. This is done by checking your blood sugar (glucose) after you have not eaten for a while (fasting). You may have this done every 1-3 years. Bone density scan. This is done to screen for osteoporosis. You may have this done starting at age 5. Mammogram. This may be done every 1-2 years. Talk to your health care provider about how often you should have regular mammograms. Talk with your health care provider about your test results, treatment options, and if necessary, the need for more tests. Vaccines  Your health care provider may recommend certain vaccines, such as: Influenza vaccine. This is recommended every year. Tetanus, diphtheria, and acellular pertussis (Tdap, Td) vaccine. You may need a Td booster every 10 years. Zoster vaccine. You may need this after age 62. Pneumococcal 13-valent conjugate (PCV13) vaccine. One dose is recommended  after age  65. Pneumococcal polysaccharide (PPSV23) vaccine. One dose is recommended after age 27. Talk to your health care provider about which screenings and vaccines you need and how often you need them. This information is not intended to replace advice given to you by your health care provider. Make sure you discuss any questions you have with your health care provider. Document Released: 03/13/2015 Document Revised: 11/04/2015 Document Reviewed: 12/16/2014 Elsevier Interactive Patient Education  2017 [place] Prevention in the Home Falls can cause injuries. They can happen to people of all ages. There are many things you can do to make your home safe and to help prevent falls. What can I do on the outside of my home? Regularly fix the edges of walkways and driveways and fix any cracks. Remove anything that might make you trip as you walk through a door, such as a raised step or threshold. Trim any bushes or trees on the path to your home. Use bright outdoor lighting. Clear any walking paths of anything that might make someone trip, such as rocks or tools. Regularly check to see if handrails are loose or broken. Make sure that both sides of any steps have handrails. Any raised decks and porches should have guardrails on the edges. Have any leaves, snow, or ice cleared regularly. Use sand or salt on walking paths during winter. Clean up any spills in your garage right away. This includes oil or grease spills. What can I do in the bathroom? Use night lights. Install grab bars by the toilet and in the tub and shower. Do not use towel bars as grab bars. Use non-skid mats or decals in the tub or shower. If you need to sit down in the shower, use a plastic, non-slip stool. Keep the floor dry. Clean up any water that spills on the floor as soon as it happens. Remove soap buildup in the tub or shower regularly. Attach bath mats securely with double-sided non-slip rug tape. Do not  have throw rugs and other things on the floor that can make you trip. What can I do in the bedroom? Use night lights. Make sure that you have a light by your bed that is easy to reach. Do not use any sheets or blankets that are too big for your bed. They should not hang down onto the floor. Have a firm chair that has side arms. You can use this for support while you get dressed. Do not have throw rugs and other things on the floor that can make you trip. What can I do in the kitchen? Clean up any spills right away. Avoid walking on wet floors. Keep items that you use a lot in easy-to-reach places. If you need to reach something above you, use a strong step stool that has a grab bar. Keep electrical cords out of the way. Do not use floor polish or wax that makes floors slippery. If you must use wax, use non-skid floor wax. Do not have throw rugs and other things on the floor that can make you trip. What can I do with my stairs? Do not leave any items on the stairs. Make sure that there are handrails on both sides of the stairs and use them. Fix handrails that are broken or loose. Make sure that handrails are as long as the stairways. Check any carpeting to make sure that it is firmly attached to the stairs. Fix any carpet that is loose or worn. Avoid having throw rugs at the  top or bottom of the stairs. If you do have throw rugs, attach them to the floor with carpet tape. Make sure that you have a light switch at the top of the stairs and the bottom of the stairs. If you do not have them, ask someone to add them for you. What else can I do to help prevent falls? Wear shoes that: Do not have high heels. Have rubber bottoms. Are comfortable and fit you well. Are closed at the toe. Do not wear sandals. If you use a stepladder: Make sure that it is fully opened. Do not climb a closed stepladder. Make sure that both sides of the stepladder are locked into place. Ask someone to hold it for  you, if possible. Clearly mark and make sure that you can see: Any grab bars or handrails. First and last steps. Where the edge of each step is. Use tools that help you move around (mobility aids) if they are needed. These include: Canes. Walkers. Scooters. Crutches. Turn on the lights when you go into a dark area. Replace any light bulbs as soon as they burn out. Set up your furniture so you have a clear path. Avoid moving your furniture around. If any of your floors are uneven, fix them. If there are any pets around you, be aware of where they are. Review your medicines with your doctor. Some medicines can make you feel dizzy. This can increase your chance of falling. Ask your doctor what other things that you can do to help prevent falls. This information is not intended to replace advice given to you by your health care provider. Make sure you discuss any questions you have with your health care provider. Document Released: 12/11/2008 Document Revised: 07/23/2015 Document Reviewed: 03/21/2014 Elsevier Interactive Patient Education  2017 [employer].

## 2021-09-03 NOTE — Progress Notes (Signed)
I connected with Carla Wright today by telephone and verified that I am speaking with the correct person using two identifiers. Location patient: home Location provider: work Persons participating in the virtual visit: Rosaura, Bolon LPN.   I discussed the limitations, risks, security and privacy concerns of performing an evaluation and management service by telephone and the availability of in person appointments. I also discussed with the patient that there may be a patient responsible charge related to this service. The patient expressed understanding and verbally consented to this telephonic visit.    Interactive audio and video telecommunications were attempted between this provider and patient, however failed, due to patient having technical difficulties OR patient did not have access to video capability.  We continued and completed visit with audio only.     Vital signs may be patient reported or missing.  Subjective:   Carla Wright is a 76 y.o. female who presents for Medicare Annual (Subsequent) preventive examination.  Review of Systems     Cardiac Risk Factors include: advanced age (>49mn, >>69women);diabetes mellitus;dyslipidemia;hypertension;obesity (BMI >30kg/m2)     Objective:    Today's Vitals   09/03/21 1056  Weight: 247 lb (112 kg)  Height: '5\' 5"'$  (1.651 m)   Body mass index is 41.1 kg/m.     09/03/2021   11:02 AM 09/01/2020    9:41 AM 09/12/2018   11:01 AM 11/01/2016    9:03 AM 08/26/2014    9:26 AM 12/14/2011    2:45 PM 12/08/2011    8:29 AM  Advanced Directives  Does Patient Have a Medical Advance Directive? No No No Yes Yes Patient does not have advance directive Patient does not have advance directive  Does patient want to make changes to medical advance directive?    No - Patient declined     Would patient like information on creating a medical advance directive?  Yes (ED - Information included in AVS) Yes (MAU/Ambulatory/Procedural Areas -  Information given)      Pre-existing out of facility DNR order (yellow form or pink MOST form)      No No    Current Medications (verified) Outpatient Encounter Medications as of 09/03/2021  Medication Sig   acetaminophen (TYLENOL) 650 MG CR tablet Take by mouth.   allopurinol (ZYLOPRIM) 300 MG tablet Take 1 tablet (300 mg total) by mouth daily.   aspirin 81 MG tablet Take 81 mg by mouth daily.   atorvastatin (LIPITOR) 10 MG tablet Take 1 tablet (10 mg total) by mouth daily.   BIOTIN PO Take by mouth.   co-enzyme Q-10 30 MG capsule Take 30 mg by mouth 3 (three) times daily.   levothyroxine (EUTHYROX) 88 MCG tablet Take 1 tablet (88 mcg total) by mouth daily.   metFORMIN (GLUCOPHAGE) 1000 MG tablet Take 1 tablet (1,000 mg total) by mouth 2 (two) times daily with a meal.   Multiple Vitamin (MULTIVITAMIN WITH MINERALS) TABS Take 1 tablet by mouth daily.   lisinopril-hydrochlorothiazide (ZESTORETIC) 20-12.5 MG tablet Take 1 tablet by mouth daily for 3 days.   No facility-administered encounter medications on file as of 09/03/2021.    Allergies (verified) Elemental sulfur   History: Past Medical History:  Diagnosis Date   Actinic keratoses    Allergy    Arthritis    [place] [place] [facility]    basal cell removed from face   Diverticulosis    Glucose intolerance (impaired glucose tolerance)    Gout    Hypertension    EKG  and clearance Dr Judson Roch on chart   Hypothyroidism    Obesity    Sleep apnea    doesnt used machine in months-per Dr [employer] note- borderline   Thyroid disease    Past Surgical History:  Procedure Laterality Date   BROW LIFT     COLONOSCOPY  2012   BRODIE   MOHS SURGERY     tip of the nose 11/2020   [facility]  Left 09/25/2019   solar lentigo and actinic keratosis   TAYLOR BUNIONECTOMY  2012   right   TOTAL HIP ARTHROPLASTY  08/22/2011   Procedure: TOTAL HIP ARTHROPLASTY;  Surgeon: Gearlean Alf, MD;  Location: [facility];  Service: Orthopedics;  Laterality: Right;   TOTAL  HIP ARTHROPLASTY  12/14/2011   Procedure: TOTAL HIP ARTHROPLASTY;  Surgeon: Gearlean Alf, MD;  Location: [facility];  Service: Orthopedics;  Laterality: Left;   History reviewed. No pertinent family history. Social History   Socioeconomic History   Marital status: Single    Spouse name: Not on file   Number of children: Not on file   Years of education: Not on file   Highest education level: Not on file  Occupational History   Not on file  Tobacco Use   Smoking status: Former    Packs/day: 1.00    Years: 15.00    Total pack years: 15.00    Types: Cigarettes    Quit date: 08/14/1981    Years since quitting: 40.0   Smokeless tobacco: Never  Vaping Use   Vaping Use: Never used  Substance and Sexual Activity   Alcohol use: Yes    Comment: socially/ occasionally   Drug use: No   Sexual activity: Not Currently  Other Topics Concern   Not on file  Social History Narrative   Not on file   Social Determinants of Health   Financial Resource Strain: Low Risk  (09/03/2021)   Overall Financial Resource Strain (CARDIA)    Difficulty of Paying Living Expenses: Not hard at all  Food Insecurity: No Food Insecurity (09/03/2021)   Hunger Vital Sign    Worried About Running Out of Food in the Last Year: Never true    [place] in the Last Year: Never true  Transportation Needs: No Transportation Needs (09/03/2021)   PRAPARE - Hydrologist (Medical): No    Lack of Transportation (Non-Medical): No  Physical Activity: Inactive (09/03/2021)   Exercise Vital Sign    Days of Exercise per Week: 0 days    Minutes of Exercise per Session: 0 min  Stress: No Stress Concern Present (09/03/2021)   [place]    Feeling of Stress : Not at all  Social Connections: Not on file    Tobacco Counseling Counseling given: Not Answered   Clinical Intake:  Pre-visit preparation completed: Yes  Pain :  No/denies pain     Nutritional Status: BMI > 30  Obese Nutritional Risks: None Diabetes: Yes  How often do you need to have someone help you when you read instructions, pamphlets, or other written materials from your doctor or pharmacy?: 1 - Never What is the last grade level you completed in school?: [employer]  Diabetic? Yes Nutrition Risk Assessment:  Has the patient had any N/V/D within the last 2 months?  No  Does the patient have any non-healing wounds?  No  Has the patient had any unintentional weight loss or weight  gain?  No   Diabetes:  Is the patient diabetic?  Yes  If diabetic, was a CBG obtained today?  No  Did the patient bring in their glucometer from home?  No  How often do you monitor your CBG's? Does not.   Financial Strains and Diabetes Management:  Are you having any financial strains with the device, your supplies or your medication? No .  Does the patient want to be seen by Chronic Care Management for management of their diabetes?  No  Would the patient like to be referred to a Nutritionist or for Diabetic Management?  No   Diabetic Exams:  Diabetic Eye Exam: Completed 12/31/2020 Diabetic Foot Exam: Overdue, Pt has been advised about the importance in completing this exam. Pt is scheduled for diabetic foot exam on next appointment.   Interpreter Needed?: No  Information entered by :: NAllen LPN   Activities of Daily Living    09/03/2021   11:03 AM 09/02/2021   12:07 PM  In your present state of health, do you have any difficulty performing the following activities:  Hearing? 0 0  Vision? 0 0  Difficulty concentrating or making decisions? 0 0  Walking or climbing stairs? 0 0  Dressing or bathing? 0 0  Doing errands, shopping? 0 0  Preparing Food and eating ? N N  Using the Toilet? N N  In the past six months, have you accidently leaked urine? N N  Do you have problems with loss of bowel control? N N  Managing your Medications? N N  Managing  your Finances? N N  Housekeeping or managing your Housekeeping? N N    Patient Care Team: Denita Lung, MD as PCP - General (Family Medicine)  Indicate any recent Medical Services you may have received from other than [facility] providers in the past year (date may be approximate).     Assessment:   This is a routine wellness examination for Carla Wright.  Hearing/Vision screen Vision Screening - Comments:: Regular eye exams, Dr. Delman Cheadle, [facility]  Dietary issues and exercise activities discussed: Current Exercise Habits: The patient does not participate in regular exercise at present   Goals Addressed             This Visit's Progress    Patient Stated       09/03/2021, wants to lose weight       Depression Screen    09/03/2021   11:03 AM 09/01/2020    9:42 AM 08/29/2019    9:23 AM 12/13/2018   10:09 AM 09/12/2018   11:29 AM 09/12/2018   10:46 AM 11/01/2016    8:36 AM  PHQ 2/9 Scores  PHQ - 2 Score 0 0 0 0 0 0 0  PHQ- 9 Score 0          Fall Risk    09/03/2021   11:02 AM 09/02/2021   12:07 PM 09/01/2020    9:42 AM 08/29/2019    9:23 AM 12/13/2018   10:09 AM  Fall Risk   Falls in the past year? 0 0 1 0 0  Number falls in past yr: 0 0 0    Injury with Fall? 0 0 0    Risk for fall due to : Medication side effect  Impaired mobility    Follow up Falls evaluation completed;Education provided;Falls prevention discussed  Falls evaluation completed      FALL RISK PREVENTION PERTAINING TO THE HOME:  Any stairs in or around the home? Yes  If so, are there any without handrails? Yes  Home free of loose throw rugs in walkways, pet beds, electrical cords, etc? Yes  Adequate lighting in your home to reduce risk of falls? Yes   ASSISTIVE DEVICES UTILIZED TO PREVENT FALLS:  Life alert? No  Use of a cane, walker or w/c? Yes  Grab bars in the bathroom? Yes  Shower chair or bench in shower? No  Elevated toilet seat or a handicapped toilet? No   TIMED UP AND GO:  Was the test  performed? No .      Cognitive Function:        09/03/2021   11:04 AM  6CIT Screen  What Year? 0 points  What month? 0 points  What time? 0 points  Count back from 20 0 points  Months in reverse 0 points  Repeat phrase 2 points  Total Score 2 points    Immunizations Immunization History  Administered Date(s) Administered   Fluad Quad(high Dose 65+) 12/13/2018   Influenza Split 11/28/2011   Influenza, High Dose Seasonal PF 01/23/2013, 01/13/2014, 11/01/2016, 12/25/2017   Influenza-Unspecified 01/02/2021   PFIZER(Purple Top)SARS-COV-2 Vaccination 04/04/2019, 04/29/2019, 12/08/2019, 07/23/2020   Pfizer Covid-19 Vaccine Bivalent Booster 62yr & up 03/08/2021   Pneumococcal Conjugate-13 09/09/2013   Pneumococcal Polysaccharide-23 03/20/2009   Tdap 02/20/2007, 01/10/2015   Zoster Recombinat (Shingrix) 09/24/2020, 01/05/2021   Zoster, Live 02/20/2007    TDAP status: Up to date  Flu Vaccine status: Up to date  Pneumococcal vaccine status: Up to date  Covid-19 vaccine status: Completed vaccines  Qualifies for Shingles Vaccine? Yes   Zostavax completed Yes   Shingrix Completed?: Yes  Screening Tests Health Maintenance  Topic Date Due   FOOT EXAM  09/01/2021   URINE MICROALBUMIN  09/01/2021   HEMOGLOBIN A1C  09/05/2021   INFLUENZA VACCINE  09/28/2021   OPHTHALMOLOGY EXAM  12/31/2021   Fecal DNA (Cologuard)  09/15/2023   TETANUS/TDAP  01/09/2025   DEXA SCAN  Completed   COVID-19 Vaccine  Completed   Hepatitis C Screening  Completed   Zoster Vaccines- Shingrix  Completed   HPV VACCINES  Aged Out   Pneumonia Vaccine 75 Years old  Discontinued   COLONOSCOPY (Pts 45-420yrInsurance coverage will need to be confirmed)  Discontinued    Health Maintenance  Health Maintenance Due  Topic Date Due   FOOT EXAM  09/01/2021   URINE MICROALBUMIN  09/01/2021    Colorectal cancer screening: Type of screening: Cologuard. Completed 09/14/2020. Repeat every 3  years  Mammogram status: Completed 09/18/2020. Repeat every 2 years  Bone Density status: Completed 09/18/2020.   Lung Cancer Screening: (Low Dose CT Chest recommended if Age 75-80ears, 30 pack-year currently smoking OR have quit w/in 15years.) does not qualify.   Lung Cancer Screening Referral: no  Additional Screening:  Hepatitis C Screening: does qualify; Completed 09/14/2015  Vision Screening: Recommended annual ophthalmology exams for early detection of glaucoma and other disorders of the eye. Is the patient up to date with their annual eye exam?  Yes  Who is the provider or what is the name of the office in which the patient attends annual eye exams? Dr. GoDelman Cheadlef pt is not established with a provider, would they like to be referred to a provider to establish care? No .   Dental Screening: Recommended annual dental exams for proper oral hygiene  Community Resource Referral / Chronic Care Management: CRR required this visit?  No   CCM required this visit?  No  Plan:     I have personally reviewed and noted the following in the patient's chart:   Medical and social history Use of alcohol, tobacco or illicit drugs  Current medications and supplements including opioid prescriptions.  Functional ability and status Nutritional status Physical activity Advanced directives List of other physicians Hospitalizations, surgeries, and ER visits in previous 12 months Vitals Screenings to include cognitive, depression, and falls Referrals and appointments  In addition, I have reviewed and discussed with patient certain preventive protocols, quality metrics, and best practice recommendations. A written personalized care plan for preventive services as well as general preventive health recommendations were provided to patient.     Kellie Simmering, LPN   8/0/1655   Nurse Notes: none  Due to this being a virtual visit, the after visit summary with patients personalized plan  was offered to patient via mail or my-chart. Patient would like to access on my-chart

## 2021-09-10 ENCOUNTER — Ambulatory Visit (INDEPENDENT_AMBULATORY_CARE_PROVIDER_SITE_OTHER): Payer: Medicare HMO | Admitting: Family Medicine

## 2021-09-10 ENCOUNTER — Encounter: Payer: Self-pay | Admitting: Family Medicine

## 2021-09-10 VITALS — BP 134/76 | HR 107 | Temp 98.1°F | Ht 64.5 in | Wt 241.0 lb

## 2021-09-10 DIAGNOSIS — M8580 Other specified disorders of bone density and structure, unspecified site: Secondary | ICD-10-CM

## 2021-09-10 DIAGNOSIS — K5790 Diverticulosis of intestine, part unspecified, without perforation or abscess without bleeding: Secondary | ICD-10-CM | POA: Diagnosis not present

## 2021-09-10 DIAGNOSIS — E11618 Type 2 diabetes mellitus with other diabetic arthropathy: Secondary | ICD-10-CM | POA: Diagnosis not present

## 2021-09-10 DIAGNOSIS — Z0000 Encounter for general adult medical examination without abnormal findings: Secondary | ICD-10-CM | POA: Diagnosis not present

## 2021-09-10 DIAGNOSIS — I152 Hypertension secondary to endocrine disorders: Secondary | ICD-10-CM

## 2021-09-10 DIAGNOSIS — E119 Type 2 diabetes mellitus without complications: Secondary | ICD-10-CM

## 2021-09-10 DIAGNOSIS — Z8739 Personal history of other diseases of the musculoskeletal system and connective tissue: Secondary | ICD-10-CM

## 2021-09-10 DIAGNOSIS — E039 Hypothyroidism, unspecified: Secondary | ICD-10-CM

## 2021-09-10 DIAGNOSIS — E785 Hyperlipidemia, unspecified: Secondary | ICD-10-CM | POA: Diagnosis not present

## 2021-09-10 DIAGNOSIS — E1159 Type 2 diabetes mellitus with other circulatory complications: Secondary | ICD-10-CM

## 2021-09-10 DIAGNOSIS — E1169 Type 2 diabetes mellitus with other specified complication: Secondary | ICD-10-CM

## 2021-09-10 DIAGNOSIS — E1121 Type 2 diabetes mellitus with diabetic nephropathy: Secondary | ICD-10-CM | POA: Diagnosis not present

## 2021-09-10 LAB — POCT GLYCOSYLATED HEMOGLOBIN (HGB A1C): Hemoglobin A1C: 6.8 % — AB (ref 4.0–5.6)

## 2021-09-10 LAB — POCT UA - MICROALBUMIN
Albumin/Creatinine Ratio, Urine, POC: <7.6
Creatinine, POC: 66.2 mg/dL
Microalbumin Ur, POC: <5 mg/L

## 2021-09-10 MED ORDER — ALLOPURINOL 300 MG PO TABS: 300.0000 mg | ORAL_TABLET | Freq: Every day | ORAL | 3 refills | Status: DC

## 2021-09-10 MED ORDER — LEVOTHYROXINE SODIUM 88 MCG PO TABS: 88.0000 ug | ORAL_TABLET | Freq: Every day | ORAL | 3 refills | Status: DC

## 2021-09-10 MED ORDER — LISINOPRIL-HYDROCHLOROTHIAZIDE 20-12.5 MG PO TABS: 1.0000 | ORAL_TABLET | Freq: Every day | ORAL | 3 refills | Status: DC

## 2021-09-10 MED ORDER — METFORMIN HCL 1000 MG PO TABS: 1000.0000 mg | ORAL_TABLET | Freq: Two times a day (BID) | ORAL | 1 refills | Status: DC

## 2021-09-10 MED ORDER — ATORVASTATIN CALCIUM 10 MG PO TABS: 10.0000 mg | ORAL_TABLET | Freq: Every day | ORAL | 3 refills | Status: DC

## 2021-09-10 NOTE — Patient Instructions (Signed)

## 2021-09-10 NOTE — Progress Notes (Signed)
Patient: Carla Wright   DOB: April 18, 1945   76 y.o. Female  MRN: [id number]  Subjective:    Chief Complaint  Patient presents with   Annual Exam    Fasting     Carla Wright is a 76 y.o. female who presents today for a complete physical exam. She reports consuming a diabetic, 2000 calorie diet. The patient does not participate in regular exercise at present. She generally feels well. She reports sleeping well.  She continues on metformin and is having no difficulty with that.  She is also taking levothyroxine for her hypothyroidism with no complaints.  She has not had a gout attack in quite some time.  She continues on allopurinol for this.  She is also taking atorvastatin.  She continues on Prolia.  She does have a history of osteopenia.  Her weight is unchanged.  She finally admitted to not really wanting to exercise.  She continues on lisinopril/HCTZ.  She is retired and enjoying her retirement.  She does not smoke or drink.  She does keep in touch with her friends and her sister. Most recent fall risk assessment:    09/03/2021   11:02 AM  Fall Risk   Falls in the past year? 0  Number falls in past yr: 0  Injury with Fall? 0  Risk for fall due to : Medication side effect  Follow up Falls evaluation completed;Education provided;Falls prevention discussed     Most recent depression screenings:    09/03/2021   11:03 AM 09/01/2020    9:42 AM  PHQ 2/9 Scores  PHQ - 2 Score 0 0  PHQ- 9 Score 0       Patient Active Problem List   Diagnosis Date Noted   Squamous cell carcinoma in situ (SCCIS) of scalp 11/27/2020   Squamous cell carcinoma in situ (SCCIS) of skin of forehead 11/27/2020   Basal cell carcinoma (BCC) of nasal tip 11/06/2020   Osteopenia 08/29/2019   Diabetic nephropathy associated with type 2 diabetes mellitus ([place]) 04/29/2019   Arthritis associated with diabetes ([place]) 09/14/2015   Hyperlipidemia associated with type 2 diabetes mellitus ([place]) 05/06/2013   Diabetes ([place])  03/07/2013   S/P total hip arthroplasty 01/23/2013   Hypothyroid 06/13/2011   Hypertension associated with diabetes ([place]) 06/13/2011   Sleep apnea, obstructive 08/02/2010   Morbid obesity ([place])    Diverticulosis    Actinic keratoses    History of gout    Past Medical History:  Diagnosis Date   Actinic keratoses    Allergy    Arthritis    Cancer ([place])    basal cell removed from face   Diverticulosis    Glucose intolerance (impaired glucose tolerance)    Gout    Hypertension    EKG and clearance Dr Judson Roch on chart   Hypothyroidism    Obesity    Sleep apnea    doesnt used machine in months-per Dr Redmond School note- borderline   Thyroid disease    Past Surgical History:  Procedure Laterality Date   BROW LIFT     COLONOSCOPY  2012   BR[facility]   MOHS SURGERY     tip of the nose 11/2020   [facility]  Left 09/25/2019   solar lentigo and actinic keratosis   TAYLOR BUNIONECTOMY  2012   right   TOTAL HIP ARTHROPLASTY  08/22/2011   Procedure: TOTAL HIP ARTHROPLASTY;  Surgeon: Gearlean Alf, MD;  Location: [facility];  Service: Orthopedics;  Laterality: Right;  TOTAL HIP ARTHROPLASTY  12/14/2011   Procedure: TOTAL HIP ARTHROPLASTY;  Surgeon: Gearlean Alf, MD;  Location: [facility];  Service: Orthopedics;  Laterality: Left;   Social History   Tobacco Use   Smoking status: Former    Packs/day: 1.00    Years: 15.00    Total pack years: 15.00    Types: Cigarettes    Quit date: 08/14/1981    Years since quitting: 40.1   Smokeless tobacco: Never  Vaping Use   Vaping Use: Never used  Substance Use Topics   Alcohol use: Yes    Comment: socially/ occasionally   Drug use: No   No family history on file. Allergies  Allergen Reactions   Elemental Sulfur Other (See Comments)     body pain      Patient Care Team: Denita Lung, MD as PCP - General (Family Medicine)   Outpatient Medications Prior to Visit  Medication Sig Note   acetaminophen (TYLENOL) 650 MG CR tablet Take by mouth.  09/10/2021: Prn lats dose two weeks ago   aspirin 81 MG tablet Take 81 mg by mouth daily.    BIOTIN PO Take by mouth.    co-enzyme Q-10 30 MG capsule Take 30 mg by mouth 3 (three) times daily.    Multiple Vitamin (MULTIVITAMIN WITH MINERALS) TABS Take 1 tablet by mouth daily.    [DISCONTINUED] allopurinol (ZYLOPRIM) 300 MG tablet Take 1 tablet (300 mg total) by mouth daily.    [DISCONTINUED] atorvastatin (LIPITOR) 10 MG tablet Take 1 tablet (10 mg total) by mouth daily.    [DISCONTINUED] levothyroxine (EUTHYROX) 88 MCG tablet Take 1 tablet (88 mcg total) by mouth daily.    [DISCONTINUED] lisinopril-hydrochlorothiazide (ZESTORETIC) 20-12.5 MG tablet Take 1 tablet by mouth daily for 3 days.    [DISCONTINUED] metFORMIN (GLUCOPHAGE) 1000 MG tablet Take 1 tablet (1,000 mg total) by mouth 2 (two) times daily with a meal.    No facility-administered medications prior to visit.    Review of Systems  All other systems reviewed and are negative.         Objective:     BP 134/76   Pulse (!) 107   Temp 98.1 F (36.7 C)   Ht 5' 4.5" (1.638 m)   Wt 241 lb (109.3 kg)   SpO2 98%   BMI 40.73 kg/m  BP Readings from Last 3 Encounters:  09/10/21 134/76  03/08/21 130/74  09/01/20 136/84   Wt Readings from Last 3 Encounters:  09/10/21 241 lb (109.3 kg)  09/03/21 247 lb (112 kg)  03/08/21 249 lb (112.9 kg)   The 10-year ASCVD risk score (Arnett DK, et al., 2019) is: 51%   Values used to calculate the score:     Age: 19 years     Sex: Female     Is Non-Hispanic African American: No     Diabetic: Yes     Tobacco smoker: Yes     Systolic Blood Pressure: 599 mmHg     Is BP treated: Yes     HDL Cholesterol: 54 mg/dL     Total Cholesterol: 138 mg/dL   Physical Exam  Alert and in no distress. Tympanic membranes and canals are normal. Pharyngeal area is normal. Neck is supple without adenopathy or thyromegaly. Cardiac exam shows a regular sinus rhythm without murmurs or gallops. Lungs are  clear to auscultation. Diabetic foot exam is normal.  Hemoglobin A1c is 6.8. Last CBC Lab Results  Component Value Date   WBC 8.4  09/01/2020   HGB 12.5 09/01/2020   HCT 38.1 09/01/2020   MCV 97 09/01/2020   MCH 31.7 09/01/2020   RDW 13.1 09/01/2020   PLT 239 53/74/8270   Last metabolic panel Lab Results  Component Value Date   GLUCOSE 127 (H) 09/01/2020   NA 140 09/01/2020   K 4.7 09/01/2020   CL 98 09/01/2020   CO2 22 09/01/2020   BUN 22 09/01/2020   CREATININE 1.27 (H) 09/01/2020   EGFR 44 (L) 09/01/2020   CALCIUM 10.0 09/01/2020   PROT 6.8 09/01/2020   ALBUMIN 4.4 09/01/2020   LABGLOB 2.4 09/01/2020   AGRATIO 1.8 09/01/2020   BILITOT 0.3 09/01/2020   ALKPHOS 80 09/01/2020   AST 12 09/01/2020   ALT 9 09/01/2020   Last lipids Lab Results  Component Value Date   CHOL 138 09/01/2020   HDL 54 09/01/2020   LDLCALC 67 09/01/2020   TRIG 93 09/01/2020   CHOLHDL 2.6 09/01/2020   Last hemoglobin A1c Lab Results  Component Value Date   HGBA1C 6.4 (A) 03/08/2021   Last thyroid functions Lab Results  Component Value Date   TSH 3.230 09/01/2020        Assessment & Plan:     Routine general medical examination at a health care facility  Hypertension associated with diabetes ([place]) - Plan: CBC with Differential/Platelet, Comprehensive metabolic panel, lisinopril-hydrochlorothiazide (ZESTORETIC) 20-12.5 MG tablet  Diverticulosis  Arthritis associated with diabetes ([place])  Type 2 diabetes mellitus with other diabetic arthropathy, without long-term current use of insulin ([place]) - Plan: POCT glycosylated hemoglobin (Hb A1C), POCT UA - Microalbumin  Diabetic nephropathy associated with type 2 diabetes mellitus (HCC)  Hyperlipidemia associated with type 2 diabetes mellitus ([place]) - Plan: Lipid panel, atorvastatin (LIPITOR) 10 MG tablet  Hypothyroidism, unspecified type - Plan: TSH, levothyroxine (EUTHYROX) 88 MCG tablet  Osteopenia, unspecified location  History of  gout - Plan: Uric Acid, allopurinol (ZYLOPRIM) 300 MG tablet  Morbid obesity ([place])  Type 2 diabetes mellitus without complication, without long-term current use of insulin ([place]) - Plan: metFORMIN (GLUCOPHAGE) 1000 MG tablet  Immunization History  Administered Date(s) Administered   Fluad Quad(high Dose 65+) 12/13/2018   Influenza Split 11/28/2011   Influenza, High Dose Seasonal PF 01/23/2013, 01/13/2014, 11/01/2016, 12/25/2017   Influenza-Unspecified 01/02/2021   PFIZER(Purple Top)SARS-COV-2 Vaccination 04/04/2019, 04/29/2019, 12/08/2019, 07/23/2020   Pfizer Covid-19 Vaccine Bivalent Booster 83yr & up 03/08/2021   Pneumococcal Conjugate-13 09/09/2013   Pneumococcal Polysaccharide-23 03/20/2009   Tdap 02/20/2007, 01/10/2015   Zoster Recombinat (Shingrix) 09/24/2020, 01/05/2021   Zoster, Live 02/20/2007    Health Maintenance  Topic Date Due   HEMOGLOBIN A1C  09/05/2021   INFLUENZA VACCINE  09/28/2021   OPHTHALMOLOGY EXAM  12/31/2021   FOOT EXAM  09/11/2022   Fecal DNA (Cologuard)  09/15/2023   TETANUS/TDAP  01/09/2025   DEXA SCAN  Completed   COVID-19 Vaccine  Completed   Hepatitis C Screening  Completed   Zoster Vaccines- Shingrix  Completed   HPV VACCINES  Aged Out   Pneumonia Vaccine 75 Years old  Discontinued   COLONOSCOPY (Pts 45-488yrInsurance coverage will need to be confirmed)  Discontinued  Continue on her present medication regimen.  Also recommend she stop taking her aspirin.  Discussed the need for her to finally go ahead and do an advanced directive.  Discussed health benefits of physical activity, and encouraged her to engage in regular exercise appropriate for her age and condition.  Problem List Items Addressed This Visit  Arthritis associated with diabetes ([place])   Relevant Medications   lisinopril-hydrochlorothiazide (ZESTORETIC) 20-12.5 MG tablet   metFORMIN (GLUCOPHAGE) 1000 MG tablet   allopurinol (ZYLOPRIM) 300 MG tablet   atorvastatin (LIPITOR)  10 MG tablet   Diabetes (HCC)   Relevant Medications   lisinopril-hydrochlorothiazide (ZESTORETIC) 20-12.5 MG tablet   metFORMIN (GLUCOPHAGE) 1000 MG tablet   atorvastatin (LIPITOR) 10 MG tablet   Other Relevant Orders   POCT glycosylated hemoglobin (Hb A1C)   POCT UA - Microalbumin   Diabetic nephropathy associated with type 2 diabetes mellitus (HCC)   Relevant Medications   lisinopril-hydrochlorothiazide (ZESTORETIC) 20-12.5 MG tablet   metFORMIN (GLUCOPHAGE) 1000 MG tablet   atorvastatin (LIPITOR) 10 MG tablet   Diverticulosis   History of gout   Relevant Medications   allopurinol (ZYLOPRIM) 300 MG tablet   Other Relevant Orders   Uric Acid   Hyperlipidemia associated with type 2 diabetes mellitus (HCC)   Relevant Medications   lisinopril-hydrochlorothiazide (ZESTORETIC) 20-12.5 MG tablet   metFORMIN (GLUCOPHAGE) 1000 MG tablet   atorvastatin (LIPITOR) 10 MG tablet   Other Relevant Orders   Lipid panel   Hypertension associated with diabetes ([place])   Relevant Medications   lisinopril-hydrochlorothiazide (ZESTORETIC) 20-12.5 MG tablet   metFORMIN (GLUCOPHAGE) 1000 MG tablet   atorvastatin (LIPITOR) 10 MG tablet   Other Relevant Orders   CBC with Differential/Platelet   Comprehensive metabolic panel   Hypothyroid   Relevant Medications   levothyroxine (EUTHYROX) 88 MCG tablet   Other Relevant Orders   TSH   Morbid obesity ([place])   Relevant Medications   metFORMIN (GLUCOPHAGE) 1000 MG tablet   Osteopenia   Other Visit Diagnoses     Routine general medical examination at a health care facility    -  Primary     Return here in 6 months for follow-up on diabetes. Return in about 1 year (around 09/11/2022) for awv/cpe fasting .     Jill Alexanders, MD

## 2021-09-11 LAB — CBC WITH DIFFERENTIAL/PLATELET
Basophils Absolute: 0.1 10*3/uL (ref 0.0–0.2)
Basos: 1 %
EOS (ABSOLUTE): 0.3 10*3/uL (ref 0.0–0.4)
Eos: 4 %
Hematocrit: 37.8 % (ref 34.0–46.6)
Hemoglobin: 12.5 g/dL (ref 11.1–15.9)
Immature Grans (Abs): 0 10*3/uL (ref 0.0–0.1)
Immature Granulocytes: 0 %
Lymphocytes Absolute: 2.3 10*3/uL (ref 0.7–3.1)
Lymphs: 25 %
MCH: 30.7 pg (ref 26.6–33.0)
MCHC: 33.1 g/dL (ref 31.5–35.7)
MCV: 93 fL (ref 79–97)
Monocytes Absolute: 0.6 10*3/uL (ref 0.1–0.9)
Monocytes: 7 %
Neutrophils Absolute: 5.6 10*3/uL (ref 1.4–7.0)
Neutrophils: 63 %
Platelets: 240 10*3/uL (ref 150–450)
RBC: 4.07 x10E6/uL (ref 3.77–5.28)
RDW: 13.3 % (ref 11.7–15.4)
WBC: 8.9 10*3/uL (ref 3.4–10.8)

## 2021-09-11 LAB — COMPREHENSIVE METABOLIC PANEL
ALT: 25 IU/L (ref 0–32)
AST: 23 IU/L (ref 0–40)
Albumin/Globulin Ratio: 1.9 (ref 1.2–2.2)
Albumin: 4.4 g/dL (ref 3.8–4.8)
Alkaline Phosphatase: 80 IU/L (ref 44–121)
BUN/Creatinine Ratio: 18 (ref 12–28)
BUN: 20 mg/dL (ref 8–27)
Bilirubin Total: 0.4 mg/dL (ref 0.0–1.2)
CO2: 19 mmol/L — ABNORMAL LOW (ref 20–29)
Calcium: 10.8 mg/dL — ABNORMAL HIGH (ref 8.7–10.3)
Chloride: 100 mmol/L (ref 96–106)
Creatinine, Ser: 1.09 mg/dL — ABNORMAL HIGH (ref 0.57–1.00)
Globulin, Total: 2.3 g/dL (ref 1.5–4.5)
Glucose: 162 mg/dL — ABNORMAL HIGH (ref 70–99)
Potassium: 4.1 mmol/L (ref 3.5–5.2)
Sodium: 138 mmol/L (ref 134–144)
Total Protein: 6.7 g/dL (ref 6.0–8.5)
eGFR: 53 mL/min/{1.73_m2} — ABNORMAL LOW (ref >59)

## 2021-09-11 LAB — LIPID PANEL
Chol/HDL Ratio: 2.8 ratio (ref 0.0–4.4)
Cholesterol, Total: 144 mg/dL (ref 100–199)
HDL: 51 mg/dL (ref >39)
LDL Chol Calc (NIH): 77 mg/dL (ref 0–99)
Triglycerides: 85 mg/dL (ref 0–149)
VLDL Cholesterol Cal: 16 mg/dL (ref 5–40)

## 2021-09-11 LAB — TSH: TSH: 2.53 u[IU]/mL (ref 0.450–4.500)

## 2021-09-11 LAB — URIC ACID: Uric Acid: 4.5 mg/dL (ref 3.1–7.9)

## 2021-10-06 ENCOUNTER — Encounter (INDEPENDENT_AMBULATORY_CARE_PROVIDER_SITE_OTHER): Payer: Self-pay

## 2021-10-21 DIAGNOSIS — Z09 Encounter for follow-up examination after completed treatment for conditions other than malignant neoplasm: Secondary | ICD-10-CM | POA: Diagnosis not present

## 2021-10-21 DIAGNOSIS — L821 Other seborrheic keratosis: Secondary | ICD-10-CM | POA: Diagnosis not present

## 2021-10-21 DIAGNOSIS — Z85828 Personal history of other malignant neoplasm of skin: Secondary | ICD-10-CM | POA: Diagnosis not present

## 2021-10-21 DIAGNOSIS — Z08 Encounter for follow-up examination after completed treatment for malignant neoplasm: Secondary | ICD-10-CM | POA: Diagnosis not present

## 2021-10-21 DIAGNOSIS — L570 Actinic keratosis: Secondary | ICD-10-CM | POA: Diagnosis not present

## 2021-10-21 DIAGNOSIS — D225 Melanocytic nevi of trunk: Secondary | ICD-10-CM | POA: Diagnosis not present

## 2021-10-21 DIAGNOSIS — L820 Inflamed seborrheic keratosis: Secondary | ICD-10-CM | POA: Diagnosis not present

## 2021-10-21 DIAGNOSIS — L814 Other melanin hyperpigmentation: Secondary | ICD-10-CM | POA: Diagnosis not present

## 2021-10-21 DIAGNOSIS — L538 Other specified erythematous conditions: Secondary | ICD-10-CM | POA: Diagnosis not present

## 2021-11-03 ENCOUNTER — Encounter: Payer: Self-pay | Admitting: Internal Medicine

## 2021-12-07 ENCOUNTER — Encounter: Payer: Self-pay | Admitting: Internal Medicine

## 2021-12-15 ENCOUNTER — Other Ambulatory Visit (INDEPENDENT_AMBULATORY_CARE_PROVIDER_SITE_OTHER): Payer: Medicare HMO

## 2021-12-15 DIAGNOSIS — Z23 Encounter for immunization: Secondary | ICD-10-CM | POA: Diagnosis not present

## 2021-12-20 ENCOUNTER — Encounter: Payer: Self-pay | Admitting: Internal Medicine

## 2022-01-12 DIAGNOSIS — H5203 Hypermetropia, bilateral: Secondary | ICD-10-CM | POA: Diagnosis not present

## 2022-01-12 DIAGNOSIS — E119 Type 2 diabetes mellitus without complications: Secondary | ICD-10-CM | POA: Diagnosis not present

## 2022-01-12 DIAGNOSIS — H25013 Cortical age-related cataract, bilateral: Secondary | ICD-10-CM | POA: Diagnosis not present

## 2022-01-12 DIAGNOSIS — H2513 Age-related nuclear cataract, bilateral: Secondary | ICD-10-CM | POA: Diagnosis not present

## 2022-01-12 LAB — HM DIABETES EYE EXAM

## 2022-01-19 ENCOUNTER — Encounter: Payer: Self-pay | Admitting: *Deleted

## 2022-01-27 ENCOUNTER — Telehealth: Payer: Self-pay | Admitting: Family Medicine

## 2022-01-27 NOTE — Telephone Encounter (Signed)
Pt was called concerning Prolia. Pt was advised estimated cost was $317.00. Pt scheduled an appt for 02/01/2022 and reminded to call as leaving so medication could be set out. Medication ordered from physician services.

## 2022-02-01 ENCOUNTER — Other Ambulatory Visit: Payer: Medicare HMO

## 2022-02-01 DIAGNOSIS — M8580 Other specified disorders of bone density and structure, unspecified site: Secondary | ICD-10-CM

## 2022-02-01 MED ORDER — DENOSUMAB 60 MG/ML ~~LOC~~ SOSY
60.0000 mg | PREFILLED_SYRINGE | Freq: Once | SUBCUTANEOUS | Status: AC
  Administered 2022-02-01: 60 mg via SUBCUTANEOUS

## 2022-03-09 DIAGNOSIS — L578 Other skin changes due to chronic exposure to nonionizing radiation: Secondary | ICD-10-CM | POA: Diagnosis not present

## 2022-03-09 DIAGNOSIS — L814 Other melanin hyperpigmentation: Secondary | ICD-10-CM | POA: Diagnosis not present

## 2022-03-09 DIAGNOSIS — L538 Other specified erythematous conditions: Secondary | ICD-10-CM | POA: Diagnosis not present

## 2022-03-09 DIAGNOSIS — D225 Melanocytic nevi of trunk: Secondary | ICD-10-CM | POA: Diagnosis not present

## 2022-03-09 DIAGNOSIS — L570 Actinic keratosis: Secondary | ICD-10-CM | POA: Diagnosis not present

## 2022-03-09 DIAGNOSIS — L820 Inflamed seborrheic keratosis: Secondary | ICD-10-CM | POA: Diagnosis not present

## 2022-03-09 DIAGNOSIS — L821 Other seborrheic keratosis: Secondary | ICD-10-CM | POA: Diagnosis not present

## 2022-03-14 ENCOUNTER — Ambulatory Visit (INDEPENDENT_AMBULATORY_CARE_PROVIDER_SITE_OTHER): Payer: Medicare HMO | Admitting: Medical

## 2022-03-14 ENCOUNTER — Encounter: Payer: Self-pay | Admitting: Medical

## 2022-03-14 VITALS — BP 136/68 | HR 107 | Temp 98.0°F | Resp 20 | Wt 224.8 lb

## 2022-03-14 DIAGNOSIS — E1169 Type 2 diabetes mellitus with other specified complication: Secondary | ICD-10-CM | POA: Diagnosis not present

## 2022-03-14 DIAGNOSIS — Z7185 Encounter for immunization safety counseling: Secondary | ICD-10-CM | POA: Diagnosis not present

## 2022-03-14 DIAGNOSIS — E1159 Type 2 diabetes mellitus with other circulatory complications: Secondary | ICD-10-CM | POA: Diagnosis not present

## 2022-03-14 DIAGNOSIS — I152 Hypertension secondary to endocrine disorders: Secondary | ICD-10-CM | POA: Diagnosis not present

## 2022-03-14 DIAGNOSIS — Z8739 Personal history of other diseases of the musculoskeletal system and connective tissue: Secondary | ICD-10-CM

## 2022-03-14 DIAGNOSIS — E785 Hyperlipidemia, unspecified: Secondary | ICD-10-CM | POA: Diagnosis not present

## 2022-03-14 DIAGNOSIS — E11618 Type 2 diabetes mellitus with other diabetic arthropathy: Secondary | ICD-10-CM

## 2022-03-14 DIAGNOSIS — E039 Hypothyroidism, unspecified: Secondary | ICD-10-CM

## 2022-03-14 NOTE — Progress Notes (Signed)
Subjective:  Carla Wright is a 78 y.o. female who presents for Chief Complaint  Patient presents with   Follow-up    Medication recheck     Here for med check.  She normally sees Dr. Redmond School today here for primary care.  Last visit was in July 2023 and at that time her hemoglobin A1c was at 6.8%.  Calcium was slightly elevated for labs.  Hypertension-compliant with lisinopril HCT 20/12.5 mg daily.  No chest pain, edema, palpitations.  Not checking home BPs.  Hyperlipidemia-compliant with Lipitor 10 mg daily without complaint  Diabetes-compliant metformin 1000 mg twice daily.  Checks blood sugars - no.    Has a meter but not checking.  Compliant with allopurinol 3 mg daily for gout prevention  Compliant with levothyroxine 88 mcg for thyroid  Not exercising.   Water intake is ok most of the time.  No other aggravating or relieving factors.    No other c/o.   Past Medical History:  Diagnosis Date   Actinic keratoses    Allergy    Arthritis    Cancer ([place])    basal cell removed from face   Diverticulosis    Glucose intolerance (impaired glucose tolerance)    Gout    Hypertension    EKG and clearance Dr Judson Roch on chart   Hypothyroidism    Obesity    Sleep apnea    doesnt used machine in months-per Dr Redmond School note- borderline   Thyroid disease    Current Outpatient Medications on File Prior to Visit  Medication Sig Dispense Refill   acetaminophen (TYLENOL) 650 MG CR tablet Take by mouth.     allopurinol (ZYLOPRIM) 300 MG tablet Take 1 tablet (300 mg total) by mouth daily. 90 tablet 3   atorvastatin (LIPITOR) 10 MG tablet Take 1 tablet (10 mg total) by mouth daily. 90 tablet 3   BIOTIN PO Take by mouth.     co-enzyme Q-10 30 MG capsule Take 30 mg by mouth 3 (three) times daily.     levothyroxine (EUTHYROX) 88 MCG tablet Take 1 tablet (88 mcg total) by mouth daily. 90 tablet 3   lisinopril-hydrochlorothiazide (ZESTORETIC) 20-12.5 MG tablet Take 1 tablet by mouth daily.  90 tablet 3   metFORMIN (GLUCOPHAGE) 1000 MG tablet Take 1 tablet (1,000 mg total) by mouth 2 (two) times daily with a meal. 180 tablet 1   Multiple Vitamin (MULTIVITAMIN WITH MINERALS) TABS Take 1 tablet by mouth daily.     No current facility-administered medications on file prior to visit.    The following portions of the patient's history were reviewed and updated as appropriate: allergies, current medications, past family history, past medical history, past social history, past surgical history and problem list.  ROS Otherwise as in subjective above    Objective: BP 136/68   Pulse (!) 107   Temp 98 F (36.7 C) (Tympanic)   Resp 20   Wt 224 lb 12.8 oz (102 kg)   SpO2 98% Comment: room air  BMI 37.99 kg/m   BP Readings from Last 3 Encounters:  03/14/22 136/68  09/10/21 134/76  03/08/21 130/74   Wt Readings from Last 3 Encounters:  03/14/22 224 lb 12.8 oz (102 kg)  09/10/21 241 lb (109.3 kg)  09/03/21 247 lb (112 kg)   General appearance: alert, no distress, well developed, well nourished Neck: supple, no lymphadenopathy, no thyromegaly, no masses, no JVD or bruits Heart: RRR, normal S1, S2, no murmurs Lungs: CTA bilaterally, no wheezes,  rhonchi, or rales Pulses: 2+ radial pulses, 2+ pedal pulses, normal cap refill Ext: no edema  Diabetic Foot Exam - Simple   Simple Foot Form Diabetic Foot exam was performed with the following findings: Yes 03/14/2022  9:45 AM  Visual Inspection See comments: Yes Sensation Testing Intact to touch and monofilament testing bilaterally: Yes Pulse Check Posterior Tibialis and Dorsalis pulse intact bilaterally: Yes Comments Flat feet bilat      Assessment: Encounter Diagnoses  Name Primary?   Type 2 diabetes mellitus with other diabetic arthropathy, without long-term current use of insulin ([place]) Yes   Hypertension associated with diabetes ([place])    Hyperlipidemia associated with type 2 diabetes mellitus ([place])     Hypothyroidism, unspecified type    History of gout    Vaccine counseling    Serum calcium elevated      Plan: Hypertension - BPs ok, continue current medication  Diabetes - updated HgbA1C lab today.  Not currently checking glucose. Consider monitoring at home, continue daily foot checks, yearly eye doctor visit, routine f/u here.  Hypothyroidism - compliant, continue current dose.  Reviewed normal 08/2021 labs  Hyperlipidemia - at goal per 08/2021 labs.  Continue current medication  History of gout - no recent issues, continue allopurinol as usual  Congratulated her on weight loss intentional since 08/2021.     Vaccine counseling: Immunization History  Administered Date(s) Administered   COVID-19, mRNA, vaccine([place]nat[place])12 years and older 12/15/2021   Fluad Quad(high Dose 65+) 12/13/2018, 12/15/2021   Influenza Split 11/28/2011   Influenza, High Dose Seasonal PF 01/23/2013, 01/13/2014, 11/01/2016, 12/25/2017   Influenza-Unspecified 01/02/2021   PFIZER(Purple Top)SARS-COV-2 Vaccination 04/04/2019, 04/29/2019, 12/08/2019, 07/23/2020   Pfizer Covid-19 Vaccine Bivalent Booster 65yr & up 03/08/2021   Pneumococcal Conjugate-13 09/09/2013   Pneumococcal Polysaccharide-23 03/20/2009   Tdap 02/20/2007, 01/10/2015   Zoster Recombinat (Shingrix) 09/24/2020, 01/05/2021   Zoster, Live 02/20/2007   Up to date   JMarrahwas seen today for follow-up.  Diagnoses and all orders for this visit:  Type 2 diabetes mellitus with other diabetic arthropathy, without long-term current use of insulin (HCC) -     Hemoglobin A1c  [place]  Hyperlipidemia associated with type 2 diabetes mellitus (HCC)  Hypothyroidism, unspecified type  History of gout  Vaccine counseling  Serum calcium elevated -     PTH, Intact and Calcium    Follow up: pending labs,  yearly for CPX with Dr. LRedmond School

## 2022-03-15 LAB — PTH, INTACT AND CALCIUM
Calcium: 11.5 mg/dL — ABNORMAL HIGH (ref 8.7–10.3)
PTH: 22 pg/mL (ref 15–65)

## 2022-03-15 LAB — HEMOGLOBIN A1C
Est. average glucose Bld gHb Est-mCnc: 151 mg/dL
Hgb A1c MFr Bld: 6.9 % — ABNORMAL HIGH (ref 4.8–5.6)

## 2022-03-15 NOTE — Progress Notes (Signed)
Results sent through MyChart

## 2022-03-16 NOTE — Progress Notes (Signed)
Evidently pt can have up to 7 covid vaccines that have come out.  I postponed the last one to close gap and when pt gets number 7 we can document.

## 2022-03-29 DIAGNOSIS — H25011 Cortical age-related cataract, right eye: Secondary | ICD-10-CM | POA: Diagnosis not present

## 2022-03-29 DIAGNOSIS — Z961 Presence of intraocular lens: Secondary | ICD-10-CM | POA: Diagnosis not present

## 2022-03-29 DIAGNOSIS — H25811 Combined forms of age-related cataract, right eye: Secondary | ICD-10-CM | POA: Diagnosis not present

## 2022-03-29 DIAGNOSIS — H269 Unspecified cataract: Secondary | ICD-10-CM | POA: Diagnosis not present

## 2022-03-29 DIAGNOSIS — H2511 Age-related nuclear cataract, right eye: Secondary | ICD-10-CM | POA: Diagnosis not present

## 2022-04-12 DIAGNOSIS — Z961 Presence of intraocular lens: Secondary | ICD-10-CM | POA: Diagnosis not present

## 2022-04-12 DIAGNOSIS — H25012 Cortical age-related cataract, left eye: Secondary | ICD-10-CM | POA: Diagnosis not present

## 2022-04-12 DIAGNOSIS — H2512 Age-related nuclear cataract, left eye: Secondary | ICD-10-CM | POA: Diagnosis not present

## 2022-04-12 DIAGNOSIS — H269 Unspecified cataract: Secondary | ICD-10-CM | POA: Diagnosis not present

## 2022-04-12 DIAGNOSIS — H268 Other specified cataract: Secondary | ICD-10-CM | POA: Diagnosis not present

## 2022-04-13 ENCOUNTER — Other Ambulatory Visit: Payer: Self-pay | Admitting: Family Medicine

## 2022-04-13 DIAGNOSIS — E119 Type 2 diabetes mellitus without complications: Secondary | ICD-10-CM

## 2022-08-09 ENCOUNTER — Telehealth: Payer: Self-pay | Admitting: Family Medicine

## 2022-08-09 NOTE — Telephone Encounter (Signed)
Left message for pt to call back concerning Prolia. Pt needs to speak to [facility].

## 2022-08-10 ENCOUNTER — Telehealth: Payer: Self-pay | Admitting: Family Medicine

## 2022-08-10 NOTE — Telephone Encounter (Signed)
Pt returned call concerning Prolia. Pt was advised that approved with a estimated cost of $317. PA attached #[id number]. Pt advised to call as leaving home so medication can be set out. Pt verbalized understanding. Medication ordered from Physician Services.

## 2022-08-16 ENCOUNTER — Other Ambulatory Visit: Payer: Medicare HMO

## 2022-08-16 DIAGNOSIS — M810 Age-related osteoporosis without current pathological fracture: Secondary | ICD-10-CM | POA: Diagnosis not present

## 2022-08-16 MED ORDER — DENOSUMAB 60 MG/ML ~~LOC~~ SOSY
60.0000 mg | PREFILLED_SYRINGE | Freq: Once | SUBCUTANEOUS | Status: AC
  Administered 2022-08-16: 60 mg via SUBCUTANEOUS

## 2022-09-14 ENCOUNTER — Other Ambulatory Visit: Payer: Self-pay | Admitting: Family Medicine

## 2022-09-14 DIAGNOSIS — Z8739 Personal history of other diseases of the musculoskeletal system and connective tissue: Secondary | ICD-10-CM

## 2022-09-20 ENCOUNTER — Ambulatory Visit (INDEPENDENT_AMBULATORY_CARE_PROVIDER_SITE_OTHER): Payer: Medicare HMO

## 2022-09-20 VITALS — Ht 64.5 in | Wt 230.0 lb

## 2022-09-20 DIAGNOSIS — Z0000 Encounter for general adult medical examination without abnormal findings: Secondary | ICD-10-CM | POA: Diagnosis not present

## 2022-09-20 NOTE — Patient Instructions (Signed)
[name] , Thank you for taking time to come for your Medicare Wellness Visit. I appreciate your ongoing commitment to your health goals. Please review the following plan we discussed and let me know if I can assist you in the future.   These are the goals we discussed:  Goals      Patient Stated     09/03/2021, wants to lose weight     Patient Stated     09/20/2022, denies any goals at this time        This is a list of the screening recommended for you and due dates:  Health Maintenance  Topic Date Due   Yearly kidney function blood test for diabetes  09/11/2022   Yearly kidney health urinalysis for diabetes  09/11/2022   Hemoglobin A1C  09/12/2022   COVID-19 Vaccine (7 - 2023-24 season) 04/17/2023*   Flu Shot  09/29/2022   Eye exam for diabetics  01/13/2023   Complete foot exam   03/15/2023   Medicare Annual Wellness Visit  09/20/2023   DTaP/Tdap/Td vaccine (3 - Td or Tdap) 01/09/2025   DEXA scan (bone density measurement)  Completed   Hepatitis C Screening  Completed   Zoster (Shingles) Vaccine  Completed   HPV Vaccine  Aged Out   Pneumonia Vaccine  Discontinued   Colon Cancer Screening  Discontinued   Cologuard (Stool DNA test)  Discontinued  *Topic was postponed. The date shown is not the original due date.    Advanced directives: Advance directive discussed with you today.   Conditions/risks identified: none  Next appointment: Follow up in one year for your annual wellness visit    Preventive Care 65 Years and Older, Female Preventive care refers to lifestyle choices and visits with your health care provider that can promote health and wellness. What does preventive care include? A yearly physical exam. This is also called an annual well check. Dental exams once or twice a year. Routine eye exams. Ask your health care provider how often you should have your eyes checked. Personal lifestyle choices, including: Daily care of your teeth and gums. Regular physical  activity. Eating a healthy diet. Avoiding tobacco and drug use. Limiting alcohol use. Practicing safe sex. Taking low-dose aspirin every day. Taking vitamin and mineral supplements as recommended by your health care provider. What happens during an annual well check? The services and screenings done by your health care provider during your annual well check will depend on your age, overall health, lifestyle risk factors, and family history of disease. Counseling  Your health care provider may ask you questions about your: Alcohol use. Tobacco use. Drug use. Emotional well-being. Home and relationship well-being. Sexual activity. Eating habits. History of falls. Memory and ability to understand (cognition). Work and work Astronomer. Reproductive health. Screening  You may have the following tests or measurements: Height, weight, and BMI. Blood pressure. Lipid and cholesterol levels. These may be checked every 5 years, or more frequently if you are over 68 years old. Skin check. Lung cancer screening. You may have this screening every year starting at age 25 if you have a 30-pack-year history of smoking and currently smoke or have quit within the past 15 years. Fecal occult blood test (FOBT) of the stool. You may have this test every year starting at age 62. Flexible sigmoidoscopy or colonoscopy. You may have a sigmoidoscopy every 5 years or a colonoscopy every 10 years starting at age 67. Hepatitis C blood test. Hepatitis B blood test. Sexually  transmitted disease (STD) testing. Diabetes screening. This is done by checking your blood sugar (glucose) after you have not eaten for a while (fasting). You may have this done every 1-3 years. Bone density scan. This is done to screen for osteoporosis. You may have this done starting at age 33. Mammogram. This may be done every 1-2 years. Talk to your health care provider about how often you should have regular mammograms. Talk with your  health care provider about your test results, treatment options, and if necessary, the need for more tests. Vaccines  Your health care provider may recommend certain vaccines, such as: Influenza vaccine. This is recommended every year. Tetanus, diphtheria, and acellular pertussis (Tdap, Td) vaccine. You may need a Td booster every 10 years. Zoster vaccine. You may need this after age 44. Pneumococcal 13-valent conjugate (PCV13) vaccine. One dose is recommended after age 19. Pneumococcal polysaccharide (PPSV23) vaccine. One dose is recommended after age 58. Talk to your health care provider about which screenings and vaccines you need and how often you need them. This information is not intended to replace advice given to you by your health care provider. Make sure you discuss any questions you have with your health care provider. Document Released: 03/13/2015 Document Revised: 11/04/2015 Document Reviewed: 12/16/2014 Elsevier Interactive Patient Education  2017 [employer].  Fall Prevention in the Home Falls can cause injuries. They can happen to people of all ages. There are many things you can do to make your home safe and to help prevent falls. What can I do on the outside of my home? Regularly fix the edges of walkways and driveways and fix any cracks. Remove anything that might make you trip as you walk through a door, such as a raised step or threshold. Trim any bushes or trees on the path to your home. Use bright outdoor lighting. Clear any walking paths of anything that might make someone trip, such as rocks or tools. Regularly check to see if handrails are loose or broken. Make sure that both sides of any steps have handrails. Any raised decks and porches should have guardrails on the edges. Have any leaves, snow, or ice cleared regularly. Use sand or salt on walking paths during winter. Clean up any spills in your garage right away. This includes oil or grease spills. What can I  do in the bathroom? Use night lights. Install grab bars by the toilet and in the tub and shower. Do not use towel bars as grab bars. Use non-skid mats or decals in the tub or shower. If you need to sit down in the shower, use a plastic, non-slip stool. Keep the floor dry. Clean up any water that spills on the floor as soon as it happens. Remove soap buildup in the tub or shower regularly. Attach bath mats securely with double-sided non-slip rug tape. Do not have throw rugs and other things on the floor that can make you trip. What can I do in the bedroom? Use night lights. Make sure that you have a light by your bed that is easy to reach. Do not use any sheets or blankets that are too big for your bed. They should not hang down onto the floor. Have a firm chair that has side arms. You can use this for support while you get dressed. Do not have throw rugs and other things on the floor that can make you trip. What can I do in the kitchen? Clean up any spills right away. Avoid  walking on wet floors. Keep items that you use a lot in easy-to-reach places. If you need to reach something above you, use a strong step stool that has a grab bar. Keep electrical cords out of the way. Do not use floor polish or wax that makes floors slippery. If you must use wax, use non-skid floor wax. Do not have throw rugs and other things on the floor that can make you trip. What can I do with my stairs? Do not leave any items on the stairs. Make sure that there are handrails on both sides of the stairs and use them. Fix handrails that are broken or loose. Make sure that handrails are as long as the stairways. Check any carpeting to make sure that it is firmly attached to the stairs. Fix any carpet that is loose or worn. Avoid having throw rugs at the top or bottom of the stairs. If you do have throw rugs, attach them to the floor with carpet tape. Make sure that you have a light switch at the top of the stairs  and the bottom of the stairs. If you do not have them, ask someone to add them for you. What else can I do to help prevent falls? Wear shoes that: Do not have high heels. Have rubber bottoms. Are comfortable and fit you well. Are closed at the toe. Do not wear sandals. If you use a stepladder: Make sure that it is fully opened. Do not climb a closed stepladder. Make sure that both sides of the stepladder are locked into place. Ask someone to hold it for you, if possible. Clearly mark and make sure that you can see: Any grab bars or handrails. First and last steps. Where the edge of each step is. Use tools that help you move around (mobility aids) if they are needed. These include: Canes. Walkers. Scooters. Crutches. Turn on the lights when you go into a dark area. Replace any light bulbs as soon as they burn out. Set up your furniture so you have a clear path. Avoid moving your furniture around. If any of your floors are uneven, fix them. If there are any pets around you, be aware of where they are. Review your medicines with your doctor. Some medicines can make you feel dizzy. This can increase your chance of falling. Ask your doctor what other things that you can do to help prevent falls. This information is not intended to replace advice given to you by your health care provider. Make sure you discuss any questions you have with your health care provider. Document Released: 12/11/2008 Document Revised: 07/23/2015 Document Reviewed: 03/21/2014 Elsevier Interactive Patient Education  2017 [employer].

## 2022-09-20 NOTE — Progress Notes (Addendum)
Subjective:   Carla Wright is a 77 y.o. female who presents for Medicare Annual (Subsequent) preventive examination.  Visit Complete: Virtual  I connected with  Carla Wright on 09/20/22 by a audio enabled telemedicine application and verified that I am speaking with the correct person using two identifiers.  Patient Location: Home  Provider Location: Office/Clinic  I discussed the limitations of evaluation and management by telemedicine. The patient expressed understanding and agreed to proceed.  Patient Medicare AWV questionnaire was completed by the patient on 09/18/2022; I have confirmed that all information answered by patient is correct and no changes since this date.  Per patient no change in vitals since last visit; unable to obtain new vitals due to this being a telehealth visit.  Patient was unable to self-report vital signs via telehealth due to a lack of equipment at home.  Review of Systems     Cardiac Risk Factors include: advanced age (>41men, >67 women);diabetes mellitus;dyslipidemia;hypertension;obesity (BMI >30kg/m2)     Objective:    Today's Vitals   09/20/22 1110 09/20/22 1111  Weight: 230 lb (104.3 kg)   Height: 5' 4.5" (1.638 m)   PainSc:  3    Body mass index is 38.87 kg/m.     09/20/2022   11:16 AM 09/03/2021   11:02 AM 09/01/2020    9:41 AM 09/12/2018   11:01 AM 11/01/2016    9:03 AM 08/26/2014    9:26 AM 12/14/2011    2:45 PM  Advanced Directives  Does Patient Have a Medical Advance Directive? No No No No Yes Yes Patient does not have advance directive  Does patient want to make changes to medical advance directive?     No - Patient declined    Would patient like information on creating a medical advance directive?   Yes (ED - Information included in AVS) Yes (MAU/Ambulatory/Procedural Areas - Information given)     Pre-existing out of facility DNR order (yellow form or pink MOST form)       No    Current Medications (verified) Outpatient Encounter  Medications as of 09/20/2022  Medication Sig   acetaminophen (TYLENOL) 650 MG CR tablet Take by mouth.   allopurinol (ZYLOPRIM) 300 MG tablet TAKE 1 TABLET BY MOUTH EVERY DAY   atorvastatin (LIPITOR) 10 MG tablet Take 1 tablet (10 mg total) by mouth daily.   co-enzyme Q-10 30 MG capsule Take 30 mg by mouth 3 (three) times daily.   levothyroxine (EUTHYROX) 88 MCG tablet Take 1 tablet (88 mcg total) by mouth daily.   metFORMIN (GLUCOPHAGE) 1000 MG tablet TAKE 1 TABLET (1,000 MG TOTAL) BY MOUTH TWICE A DAY WITH FOOD   Multiple Vitamin (MULTIVITAMIN WITH MINERALS) TABS Take 1 tablet by mouth daily.   BIOTIN PO Take by mouth.   lisinopril-hydrochlorothiazide (ZESTORETIC) 20-12.5 MG tablet Take 1 tablet by mouth daily.   No facility-administered encounter medications on file as of 09/20/2022.    Allergies (verified) Elemental sulfur   History: Past Medical History:  Diagnosis Date   Actinic keratoses    Allergy    Arthritis    Cancer (HCC)    basal cell removed from face   Diverticulosis    Glucose intolerance (impaired glucose tolerance)    Gout    Hypertension    EKG and clearance Dr Christena Flake on chart   Hypothyroidism    Obesity    Sleep apnea    doesnt used machine in months-per Dr Susann Givens note- borderline   Thyroid disease  Past Surgical History:  Procedure Laterality Date   BROW LIFT     CATARACT EXTRACTION Bilateral    03/2022   COLONOSCOPY  2012   BRODIE   MOHS SURGERY     tip of the nose 11/2020   [facility]  Left 09/25/2019   solar lentigo and actinic keratosis   TAYLOR BUNIONECTOMY  2012   right   TOTAL HIP ARTHROPLASTY  08/22/2011   Procedure: TOTAL HIP ARTHROPLASTY;  Surgeon: Loanne Drilling, MD;  Location: [facility];  Service: Orthopedics;  Laterality: Right;   TOTAL HIP ARTHROPLASTY  12/14/2011   Procedure: TOTAL HIP ARTHROPLASTY;  Surgeon: Loanne Drilling, MD;  Location: [facility];  Service: Orthopedics;  Laterality: Left;   History reviewed. No pertinent family  history. Social History   Socioeconomic History   Marital status: Single    Spouse name: Not on file   Number of children: Not on file   Years of education: Not on file   Highest education level: Not on file  Occupational History   Not on file  Tobacco Use   Smoking status: Former    Current packs/day: 0.00    Average packs/day: 1 pack/day for 15.0 years (15.0 ttl pk-yrs)    Types: Cigarettes    Start date: 08/15/1966    Quit date: 08/14/1981    Years since quitting: 41.1   Smokeless tobacco: Never  Vaping Use   Vaping status: Never Used  Substance and Sexual Activity   Alcohol use: Yes    Comment: socially/ occasionally   Drug use: No   Sexual activity: Not Currently  Other Topics Concern   Not on file  Social History Narrative   Not on file   Social Determinants of Health   Financial Resource Strain: Low Risk  (09/18/2022)   Overall Financial Resource Strain (CARDIA)    Difficulty of Paying Living Expenses: Not very hard  Food Insecurity: No Food Insecurity (09/18/2022)   Hunger Vital Sign    Worried About Running Out of Food in the Last Year: Never true    Ran Out of Food in the Last Year: Never true  Transportation Needs: No Transportation Needs (09/18/2022)   PRAPARE - Administrator, Civil Service (Medical): No    Lack of Transportation (Non-Medical): No  Physical Activity: Inactive (09/18/2022)   Exercise Vital Sign    Days of Exercise per Week: 0 days    Minutes of Exercise per Session: 0 min  Stress: No Stress Concern Present (09/18/2022)   [employer] of Occupational Health - Occupational Stress Questionnaire    Feeling of Stress : Only a little  Social Connections: Unknown (09/18/2022)   Social Connection and Isolation Panel [NHANES]    Frequency of Communication with Friends and Family: More than three times a week    Frequency of Social Gatherings with Friends and Family: More than three times a week    Attends Religious Services: Not on  Marketing executive or Organizations: No    Attends Banker Meetings: Never    Marital Status: Never married    Tobacco Counseling Counseling given: Not Answered   Clinical Intake:  Pre-visit preparation completed: Yes  Pain : 0-10 Pain Score: 3  Pain Type: Chronic pain Pain Location: Neck Pain Descriptors / Indicators: Aching Pain Onset: More than a month ago Pain Frequency: Constant     Nutritional Status: BMI > 30  Obese Nutritional Risks: Nausea/ vomitting/ diarrhea (some diarrhea  yesterday, resolved) Diabetes: Yes CBG done?: No Did pt. bring in CBG monitor from home?: No  How often do you need to have someone help you when you read instructions, pamphlets, or other written materials from your doctor or pharmacy?: 1 - Never  Interpreter Needed?: No  Information entered by :: NAllen LPN   Activities of Daily Living    09/18/2022    9:21 AM  In your present state of health, do you have any difficulty performing the following activities:  Hearing? 0  Vision? 0  Difficulty concentrating or making decisions? 0  Walking or climbing stairs? 0  Dressing or bathing? 0  Doing errands, shopping? 0  Preparing Food and eating ? N  Using the Toilet? N  In the past six months, have you accidently leaked urine? N  Do you have problems with loss of bowel control? N  Managing your Medications? N  Managing your Finances? N  Housekeeping or managing your Housekeeping? N    Patient Care Team: Ronnald Nian, MD as PCP - General (Family Medicine) Cherlyn Roberts, MD as Referring Physician (Dermatology)  Indicate any recent Medical Services you may have received from other than [facility] providers in the past year (date may be approximate).     Assessment:   This is a routine wellness examination for Roselinda.  Hearing/Vision screen Hearing Screening - Comments:: Denies hearing issues Vision Screening - Comments:: Regular eye exams,   Opth  Dietary issues and exercise activities discussed:     Goals Addressed             This Visit's Progress    Patient Stated       09/20/2022, denies any goals at this time       Depression Screen    09/20/2022   11:18 AM 09/03/2021   11:03 AM 09/01/2020    9:42 AM 08/29/2019    9:23 AM 12/13/2018   10:09 AM 09/12/2018   11:29 AM 09/12/2018   10:46 AM  PHQ 2/9 Scores  PHQ - 2 Score 0 0 0 0 0 0 0  PHQ- 9 Score 0 0         Fall Risk    09/18/2022    9:21 AM 03/14/2022    9:29 AM 09/03/2021   11:02 AM 09/02/2021   12:07 PM 09/01/2020    9:42 AM  Fall Risk   Falls in the past year? 0 0 0 0 1  Number falls in past yr: 0 0 0 0 0  Injury with Fall? 0 0 0 0 0  Risk for fall due to : Medication side effect No Fall Risks Medication side effect  Impaired mobility  Follow up Falls prevention discussed;Falls evaluation completed Falls evaluation completed Falls evaluation completed;Education provided;Falls prevention discussed  Falls evaluation completed    MEDICARE RISK AT HOME:  Medicare Risk at Home - 09/20/22 1130     Any stairs in or around the home? Yes    If so, are there any without handrails? Yes    Home free of loose throw rugs in walkways, pet beds, electrical cords, etc? Yes    Adequate lighting in your home to reduce risk of falls? Yes    Life alert? No    Use of a cane, walker or w/c? Yes    Grab bars in the bathroom? Yes    Shower chair or bench in shower? No    Elevated toilet seat or a handicapped toilet? No  TIMED UP AND GO:  Was the test performed?  No    Cognitive Function:        09/20/2022   11:18 AM 09/03/2021   11:04 AM  6CIT Screen  What Year? 0 points 0 points  What month? 0 points 0 points  What time? 0 points 0 points  Count back from 20 0 points 0 points  Months in reverse 0 points 0 points  Repeat phrase 2 points 2 points  Total Score 2 points 2 points    Immunizations Immunization History  Administered Date(s)  Administered   COVID-19, mRNA, vaccine(Comirnaty)12 years and older 12/15/2021   Fluad Quad(high Dose 65+) 12/13/2018, 12/15/2021   Influenza Split 11/28/2011   Influenza, High Dose Seasonal PF 01/23/2013, 01/13/2014, 11/01/2016, 12/25/2017   Influenza-Unspecified 01/02/2021   PFIZER(Purple Top)SARS-COV-2 Vaccination 04/04/2019, 04/29/2019, 12/08/2019, 07/23/2020   Pfizer Covid-19 Vaccine Bivalent Booster 34yrs & up 03/08/2021   Pneumococcal Conjugate-13 09/09/2013   Pneumococcal Polysaccharide-23 03/20/2009   Tdap 02/20/2007, 01/10/2015   Zoster Recombinant(Shingrix) 09/24/2020, 01/05/2021   Zoster, Live 02/20/2007    TDAP status: Up to date  Flu Vaccine status: Up to date  Pneumococcal vaccine status: Up to date  Covid-19 vaccine status: Completed vaccines  Qualifies for Shingles Vaccine? Yes   Zostavax completed Yes   Shingrix Completed?: Yes  Screening Tests Health Maintenance  Topic Date Due   Diabetic kidney evaluation - eGFR measurement  09/11/2022   Diabetic kidney evaluation - Urine ACR  09/11/2022   HEMOGLOBIN A1C  09/12/2022   COVID-19 Vaccine (7 - 2023-24 season) 04/17/2023 (Originally 02/09/2022)   INFLUENZA VACCINE  09/29/2022   OPHTHALMOLOGY EXAM  01/13/2023   FOOT EXAM  03/15/2023   Medicare Annual Wellness (AWV)  09/20/2023   DTaP/Tdap/Td (3 - Td or Tdap) 01/09/2025   DEXA SCAN  Completed   Hepatitis C Screening  Completed   Zoster Vaccines- Shingrix  Completed   HPV VACCINES  Aged Out   Pneumonia Vaccine 36+ Years old  Discontinued   Colonoscopy  Discontinued   Fecal DNA (Cologuard)  Discontinued    Health Maintenance  Health Maintenance Due  Topic Date Due   Diabetic kidney evaluation - eGFR measurement  09/11/2022   Diabetic kidney evaluation - Urine ACR  09/11/2022   HEMOGLOBIN A1C  09/12/2022    Colorectal cancer screening: No longer required.   Mammogram status: Completed 09/18/2020. Repeat every 2 years  Bone Density status:  Completed 09/18/2020.   Lung Cancer Screening: (Low Dose CT Chest recommended if Age 42-80 years, 20 pack-year currently smoking OR have quit w/in 15years.) does not qualify.   Lung Cancer Screening Referral: no  Additional Screening:  Hepatitis C Screening: does qualify; Completed 09/14/2015  Vision Screening: Recommended annual ophthalmology exams for early detection of glaucoma and other disorders of the eye. Is the patient up to date with their annual eye exam?  Yes  Who is the provider or what is the name of the office in which the patient attends annual eye exams? [facility] If pt is not established with a provider, would they like to be referred to a provider to establish care? No .   Dental Screening: Recommended annual dental exams for proper oral hygiene  Diabetic Foot Exam: Diabetic Foot Exam: Completed 03/14/2022  Community Resource Referral / Chronic Care Management: CRR required this visit?  No   CCM required this visit?  No     Plan:     I have personally reviewed and noted the following  in the patient's chart:   Medical and social history Use of alcohol, tobacco or illicit drugs  Current medications and supplements including opioid prescriptions. Patient is not currently taking opioid prescriptions. Functional ability and status Nutritional status Physical activity Advanced directives List of other physicians Hospitalizations, surgeries, and ER visits in previous 12 months Vitals Screenings to include cognitive, depression, and falls Referrals and appointments  In addition, I have reviewed and discussed with patient certain preventive protocols, quality metrics, and best practice recommendations. A written personalized care plan for preventive services as well as general preventive health recommendations were provided to patient.     Barb Merino, LPN   [id number]   After Visit Summary: (MyChart) Due to this being a telephonic visit, the after  visit summary with patients personalized plan was offered to patient via MyChart   Nurse Notes: none

## 2022-09-22 ENCOUNTER — Ambulatory Visit: Payer: Medicare HMO | Admitting: Family Medicine

## 2022-10-12 ENCOUNTER — Other Ambulatory Visit: Payer: Self-pay | Admitting: Family Medicine

## 2022-10-12 DIAGNOSIS — E119 Type 2 diabetes mellitus without complications: Secondary | ICD-10-CM

## 2022-10-24 ENCOUNTER — Ambulatory Visit (INDEPENDENT_AMBULATORY_CARE_PROVIDER_SITE_OTHER): Payer: Medicare HMO | Admitting: Family Medicine

## 2022-10-24 ENCOUNTER — Encounter: Payer: Self-pay | Admitting: Family Medicine

## 2022-10-24 VITALS — BP 124/74 | HR 93 | Ht 64.75 in | Wt 228.8 lb

## 2022-10-24 DIAGNOSIS — G4733 Obstructive sleep apnea (adult) (pediatric): Secondary | ICD-10-CM | POA: Diagnosis not present

## 2022-10-24 DIAGNOSIS — E038 Other specified hypothyroidism: Secondary | ICD-10-CM

## 2022-10-24 DIAGNOSIS — E1169 Type 2 diabetes mellitus with other specified complication: Secondary | ICD-10-CM | POA: Diagnosis not present

## 2022-10-24 DIAGNOSIS — I152 Hypertension secondary to endocrine disorders: Secondary | ICD-10-CM | POA: Diagnosis not present

## 2022-10-24 DIAGNOSIS — E1121 Type 2 diabetes mellitus with diabetic nephropathy: Secondary | ICD-10-CM | POA: Diagnosis not present

## 2022-10-24 DIAGNOSIS — Z8739 Personal history of other diseases of the musculoskeletal system and connective tissue: Secondary | ICD-10-CM

## 2022-10-24 DIAGNOSIS — Z0000 Encounter for general adult medical examination without abnormal findings: Secondary | ICD-10-CM

## 2022-10-24 DIAGNOSIS — E119 Type 2 diabetes mellitus without complications: Secondary | ICD-10-CM

## 2022-10-24 DIAGNOSIS — E1159 Type 2 diabetes mellitus with other circulatory complications: Secondary | ICD-10-CM

## 2022-10-24 DIAGNOSIS — M8580 Other specified disorders of bone density and structure, unspecified site: Secondary | ICD-10-CM

## 2022-10-24 DIAGNOSIS — E785 Hyperlipidemia, unspecified: Secondary | ICD-10-CM | POA: Diagnosis not present

## 2022-10-24 DIAGNOSIS — Z96641 Presence of right artificial hip joint: Secondary | ICD-10-CM

## 2022-10-24 DIAGNOSIS — Z1231 Encounter for screening mammogram for malignant neoplasm of breast: Secondary | ICD-10-CM

## 2022-10-24 DIAGNOSIS — E039 Hypothyroidism, unspecified: Secondary | ICD-10-CM

## 2022-10-24 DIAGNOSIS — E11618 Type 2 diabetes mellitus with other diabetic arthropathy: Secondary | ICD-10-CM | POA: Diagnosis not present

## 2022-10-24 LAB — POCT URINALYSIS DIP (CLINITEK)
Bilirubin, UA: NEGATIVE
Blood, UA: NEGATIVE
Glucose, UA: NEGATIVE mg/dL
Ketones, POC UA: NEGATIVE mg/dL
Nitrite, UA: NEGATIVE
POC PROTEIN,UA: NEGATIVE
Spec Grav, UA: 1.02 (ref 1.010–1.025)
Urobilinogen, UA: 0.2 E.U./dL
pH, UA: 6 (ref 5.0–8.0)

## 2022-10-24 LAB — POCT GLYCOSYLATED HEMOGLOBIN (HGB A1C): Hemoglobin A1C: 7.5 % — AB (ref 4.0–5.6)

## 2022-10-24 MED ORDER — DAPAGLIFLOZIN PROPANEDIOL 5 MG PO TABS: 5.0000 mg | ORAL_TABLET | Freq: Every day | ORAL | 1 refills | Status: DC

## 2022-10-24 MED ORDER — METFORMIN HCL 1000 MG PO TABS
1000.0000 mg | ORAL_TABLET | Freq: Two times a day (BID) | ORAL | 1 refills | Status: DC
Start: 2022-10-24 — End: 2023-01-12

## 2022-10-24 MED ORDER — LEVOTHYROXINE SODIUM 88 MCG PO TABS
88.0000 ug | ORAL_TABLET | Freq: Every day | ORAL | 3 refills | Status: DC
Start: 2022-10-24 — End: 2023-11-13

## 2022-10-24 MED ORDER — LISINOPRIL-HYDROCHLOROTHIAZIDE 20-12.5 MG PO TABS
1.0000 | ORAL_TABLET | Freq: Every day | ORAL | 3 refills | Status: AC
Start: 2022-10-24 — End: 2023-10-19

## 2022-10-24 MED ORDER — ALLOPURINOL 300 MG PO TABS
300.0000 mg | ORAL_TABLET | Freq: Every day | ORAL | 3 refills | Status: DC
Start: 2022-10-24 — End: 2023-11-20

## 2022-10-24 MED ORDER — ATORVASTATIN CALCIUM 10 MG PO TABS
10.0000 mg | ORAL_TABLET | Freq: Every day | ORAL | 3 refills | Status: AC
Start: 2022-10-24 — End: ?

## 2022-10-24 NOTE — Progress Notes (Signed)
Complete physical exam  Patient: Carla Wright   DOB: 11-Jan-1946   77 y.o. Female  MRN: [id number]  Subjective:    Chief Complaint  Patient presents with   Annual Exam    No continuous monitoring. No new concerns.      Carla Wright is a 77 y.o. female who presents today for a complete physical exam.  She reports consuming a general diet. Home exercise routine includes not interested. She generally feels well. She reports sleeping well.  Does have arthritis and did get an injection approximately 3 years ago.  She is thinking about having another one in near future.  She sees her dermatologist regularly with a previous history of basal cell cancers.  She does have a history of gout and is doing well on allopurinol.  Continues on Lipitor without difficulty.  She is taking lisinopril/HCTZ.  She is also taking her thyroid medicines and having no trouble with the medication.  She does have a history of osteopenia and will be scheduled for follow-up DEXA scan.  She does have sleep apnea.  She did not tolerate the CPAP and states that since sleeping on her side she has done better.  Most recent fall risk assessment:    10/24/2022    8:51 AM  Fall Risk   Falls in the past year? 0  Number falls in past yr: 0  Injury with Fall? 0  Follow up Falls evaluation completed     Most recent depression screenings:    10/24/2022    8:52 AM 09/20/2022   11:18 AM  PHQ 2/9 Scores  PHQ - 2 Score 0 0  PHQ- 9 Score 2 0    Vision:Within last year    Immunization History  Administered Date(s) Administered   COVID-19, mRNA, vaccine(Comirnaty)12 years and older 12/15/2021   Fluad Quad(high Dose 65+) 12/13/2018, 12/15/2021   Influenza Split 11/28/2011   Influenza, High Dose Seasonal PF 01/23/2013, 01/13/2014, 11/01/2016, 12/25/2017   Influenza-Unspecified 01/02/2021   PFIZER(Purple Top)SARS-COV-2 Vaccination 04/04/2019, 04/29/2019, 12/08/2019, 07/23/2020   Pfizer Covid-19 Vaccine Bivalent Booster 67yrs  & up 03/08/2021   Pneumococcal Conjugate-13 09/09/2013   Pneumococcal Polysaccharide-23 03/20/2009   Tdap 02/20/2007, 01/10/2015   Zoster Recombinant(Shingrix) 09/24/2020, 01/05/2021   Zoster, Live 02/20/2007    Health Maintenance  Topic Date Due   Diabetic kidney evaluation - eGFR measurement  09/11/2022   Diabetic kidney evaluation - Urine ACR  09/11/2022   INFLUENZA VACCINE  09/29/2022   COVID-19 Vaccine (7 - 2023-24 season) 04/17/2023 (Originally 02/09/2022)   OPHTHALMOLOGY EXAM  01/13/2023   FOOT EXAM  03/15/2023   HEMOGLOBIN A1C  04/26/2023   Medicare Annual Wellness (AWV)  09/20/2023   DTaP/Tdap/Td (3 - Td or Tdap) 01/09/2025   DEXA SCAN  Completed   Hepatitis C Screening  Completed   Zoster Vaccines- Shingrix  Completed   HPV VACCINES  Aged Out   Pneumonia Vaccine 23+ Years old  Discontinued   Colonoscopy  Discontinued   Fecal DNA (Cologuard)  Discontinued    Patient Care Team: Ronnald Nian, MD as PCP - General (Family Medicine) Cherlyn Roberts, MD as Referring Physician (Dermatology)   Outpatient Medications Prior to Visit  Medication Sig Note   co-enzyme Q-10 30 MG capsule Take 30 mg by mouth 3 (three) times daily.    Multiple Vitamin (MULTIVITAMIN WITH MINERALS) TABS Take 1 tablet by mouth daily.    acetaminophen (TYLENOL) 650 MG CR tablet Take by mouth. (Patient not taking: Reported on 10/24/2022)  10/24/2022: As needed. Last dose once last week.    BIOTIN PO Take by mouth.    [DISCONTINUED] allopurinol (ZYLOPRIM) 300 MG tablet TAKE 1 TABLET BY MOUTH EVERY DAY    [DISCONTINUED] atorvastatin (LIPITOR) 10 MG tablet Take 1 tablet (10 mg total) by mouth daily.    [DISCONTINUED] levothyroxine (EUTHYROX) 88 MCG tablet Take 1 tablet (88 mcg total) by mouth daily.    [DISCONTINUED] lisinopril-hydrochlorothiazide (ZESTORETIC) 20-12.5 MG tablet Take 1 tablet by mouth daily.    [DISCONTINUED] metFORMIN (GLUCOPHAGE) 1000 MG tablet TAKE 1 TABLET (1,000 MG TOTAL) BY MOUTH  TWICE A DAY WITH FOOD    No facility-administered medications prior to visit.    Review of Systems  All other systems reviewed and are negative.   Family and social history as well as health maintenance and immunizations was reviewed.     Objective:      Physical Exam  Alert and in no distress. Tympanic membranes and canals are normal. Pharyngeal area is normal. Neck is supple without adenopathy or thyromegaly. Cardiac exam shows a regular sinus rhythm without murmurs or gallops. Lungs are clear to auscultation. Hemoglobin A1c 7.5.    Assessment & Plan:    Discussed health benefits of physical activity, and encouraged her to engage in regular exercise appropriate for her age and condition  Routine general medical examination at a health care facility - Plan: CBC with Differential/Platelet, Comprehensive metabolic panel, Lipid panel  Arthritis associated with diabetes (HCC)  Diabetic nephropathy associated with type 2 diabetes mellitus (HCC)  History of gout - Plan: allopurinol (ZYLOPRIM) 300 MG tablet, Uric Acid  Hyperlipidemia associated with type 2 diabetes mellitus (HCC) - Plan: Lipid panel, atorvastatin (LIPITOR) 10 MG tablet  Hypertension associated with diabetes (HCC) - Plan: CBC with Differential/Platelet, Comprehensive metabolic panel, lisinopril-hydrochlorothiazide (ZESTORETIC) 20-12.5 MG tablet  Other specified hypothyroidism - Plan: TSH  Osteopenia, unspecified location - Plan: DG Bone Density  Status post total replacement of right hip  Sleep apnea, obstructive  Type 2 diabetes mellitus with other specified complication, without long-term current use of insulin (HCC) - Plan: HgB A1c  Morbid obesity (HCC)  Type 2 diabetes mellitus without complication, without long-term current use of insulin (HCC) - Plan: metFORMIN (GLUCOPHAGE) 1000 MG tablet  Hypothyroidism, unspecified type - Plan: levothyroxine (EUTHYROX) 88 MCG tablet  Encounter for screening  mammogram for malignant neoplasm of breast - Plan: MM Digital Screening  I discussed her diabetes and the fact that I would add another medication to her regimen and discussed the benefits and possible risk of that.  Sample given and she will call me if she has difficulty with yeast infections etc. Return in about 6 months (around 04/26/2023).      [name], MD

## 2022-10-24 NOTE — Addendum Note (Signed)
Addended byLawernce Pitts on: 10/24/2022 10:06 AM   Modules accepted: Orders

## 2022-10-25 LAB — CBC WITH DIFFERENTIAL/PLATELET
Basophils Absolute: 0.1 10*3/uL (ref 0.0–0.2)
Basos: 1 %
EOS (ABSOLUTE): 0.3 10*3/uL (ref 0.0–0.4)
Eos: 3 %
Hematocrit: 37.5 % (ref 34.0–46.6)
Hemoglobin: 12 g/dL (ref 11.1–15.9)
Immature Grans (Abs): 0 10*3/uL (ref 0.0–0.1)
Immature Granulocytes: 0 %
Lymphocytes Absolute: 2.1 10*3/uL (ref 0.7–3.1)
Lymphs: 23 %
MCH: 30.5 pg (ref 26.6–33.0)
MCHC: 32 g/dL (ref 31.5–35.7)
MCV: 95 fL (ref 79–97)
Monocytes Absolute: 0.6 10*3/uL (ref 0.1–0.9)
Monocytes: 7 %
Neutrophils Absolute: 6.1 10*3/uL (ref 1.4–7.0)
Neutrophils: 66 %
Platelets: 262 10*3/uL (ref 150–450)
RBC: 3.93 x10E6/uL (ref 3.77–5.28)
RDW: 13.1 % (ref 11.7–15.4)
WBC: 9.1 10*3/uL (ref 3.4–10.8)

## 2022-10-25 LAB — COMPREHENSIVE METABOLIC PANEL
ALT: 17 IU/L (ref 0–32)
AST: 15 IU/L (ref 0–40)
Albumin: 4.1 g/dL (ref 3.8–4.8)
Alkaline Phosphatase: 86 IU/L (ref 44–121)
BUN/Creatinine Ratio: 18 (ref 12–28)
BUN: 22 mg/dL (ref 8–27)
Bilirubin Total: 0.4 mg/dL (ref 0.0–1.2)
CO2: 24 mmol/L (ref 20–29)
Calcium: 10.1 mg/dL (ref 8.7–10.3)
Chloride: 100 mmol/L (ref 96–106)
Creatinine, Ser: 1.21 mg/dL — ABNORMAL HIGH (ref 0.57–1.00)
Globulin, Total: 2.8 g/dL (ref 1.5–4.5)
Glucose: 183 mg/dL — ABNORMAL HIGH (ref 70–99)
Potassium: 4.5 mmol/L (ref 3.5–5.2)
Sodium: 140 mmol/L (ref 134–144)
Total Protein: 6.9 g/dL (ref 6.0–8.5)
eGFR: 46 mL/min/{1.73_m2} — ABNORMAL LOW (ref >59)

## 2022-10-25 LAB — LIPID PANEL
Chol/HDL Ratio: 2.8 ratio (ref 0.0–4.4)
Cholesterol, Total: 148 mg/dL (ref 100–199)
HDL: 52 mg/dL (ref >39)
LDL Chol Calc (NIH): 79 mg/dL (ref 0–99)
Triglycerides: 92 mg/dL (ref 0–149)
VLDL Cholesterol Cal: 17 mg/dL (ref 5–40)

## 2022-10-25 LAB — TSH: TSH: 2.5 u[IU]/mL (ref 0.450–4.500)

## 2022-10-25 LAB — URIC ACID: Uric Acid: 4.8 mg/dL (ref 3.1–7.9)

## 2022-10-26 DIAGNOSIS — L538 Other specified erythematous conditions: Secondary | ICD-10-CM | POA: Diagnosis not present

## 2022-10-26 DIAGNOSIS — L814 Other melanin hyperpigmentation: Secondary | ICD-10-CM | POA: Diagnosis not present

## 2022-10-26 DIAGNOSIS — D225 Melanocytic nevi of trunk: Secondary | ICD-10-CM | POA: Diagnosis not present

## 2022-10-26 DIAGNOSIS — Z85828 Personal history of other malignant neoplasm of skin: Secondary | ICD-10-CM | POA: Diagnosis not present

## 2022-10-26 DIAGNOSIS — Z08 Encounter for follow-up examination after completed treatment for malignant neoplasm: Secondary | ICD-10-CM | POA: Diagnosis not present

## 2022-10-26 DIAGNOSIS — Z09 Encounter for follow-up examination after completed treatment for conditions other than malignant neoplasm: Secondary | ICD-10-CM | POA: Diagnosis not present

## 2022-10-26 DIAGNOSIS — L578 Other skin changes due to chronic exposure to nonionizing radiation: Secondary | ICD-10-CM | POA: Diagnosis not present

## 2022-10-26 DIAGNOSIS — L820 Inflamed seborrheic keratosis: Secondary | ICD-10-CM | POA: Diagnosis not present

## 2022-10-26 DIAGNOSIS — L821 Other seborrheic keratosis: Secondary | ICD-10-CM | POA: Diagnosis not present

## 2022-11-07 ENCOUNTER — Telehealth: Payer: Self-pay | Admitting: Family Medicine

## 2022-11-07 NOTE — Telephone Encounter (Signed)
Pt called is wanting to know about her farxiga, it is 295 3 month supply and she can not afford that she wants to know if you can change her to something cheaper,  If you want to give her some samples there is some 10mg  in the supply closet

## 2022-11-09 ENCOUNTER — Telehealth: Payer: Self-pay

## 2022-11-09 NOTE — Telephone Encounter (Signed)
Carla Wright are you able to help pt with Pt Assistance for Farxiga & I will give her some samples to hold her

## 2022-11-09 NOTE — Progress Notes (Signed)
   11/09/2022  Patient ID: Carla Wright, female   DOB: 1945/04/05, 77 y.o.   MRN: [id number]  Patient outreach attempt to discuss affordability of Marcelline Deist and see if patient would qualify for AZ&Me PAP.  I was not able to reach her, but I was able to leave a voicemail with my direct phone number on her home phone.  Lenna Gilford, PharmD, DPLA

## 2022-11-09 NOTE — Telephone Encounter (Signed)
Called pt & informed she will be getting a call & does want to apply for Pt Assistance,  samples up front

## 2022-12-19 ENCOUNTER — Telehealth: Payer: Self-pay | Admitting: Family Medicine

## 2022-12-19 NOTE — Telephone Encounter (Signed)
Pt called and states that since she has started taking the farxiga that she has had some itching in her private area, said when she stopped taking it the itching stopped she wants to know if you can switch her to something else, Pt uses  [id number] - [place], [place] - [address] [place] [address] [facility]   Please send back to some one else as I will not be back until next Monday

## 2022-12-21 NOTE — Telephone Encounter (Signed)
Pt scheduled for 12/22/22.

## 2022-12-22 ENCOUNTER — Telehealth: Payer: Medicare HMO | Admitting: Family Medicine

## 2022-12-27 ENCOUNTER — Telehealth (INDEPENDENT_AMBULATORY_CARE_PROVIDER_SITE_OTHER): Payer: Medicare HMO | Admitting: Family Medicine

## 2022-12-27 ENCOUNTER — Encounter: Payer: Self-pay | Admitting: Family Medicine

## 2022-12-27 VITALS — Ht 64.75 in | Wt 225.0 lb

## 2022-12-27 DIAGNOSIS — E1169 Type 2 diabetes mellitus with other specified complication: Secondary | ICD-10-CM

## 2022-12-27 DIAGNOSIS — Z7985 Long-term (current) use of injectable non-insulin antidiabetic drugs: Secondary | ICD-10-CM | POA: Diagnosis not present

## 2022-12-27 MED ORDER — TIRZEPATIDE 2.5 MG/0.5ML ~~LOC~~ SOAJ: 2.5000 mg | SUBCUTANEOUS | 0 refills | Status: DC

## 2022-12-27 NOTE — Progress Notes (Signed)
Subjective:    Patient ID: Carla Wright, female    DOB: 1945/07/13, 77 y.o.   MRN: [id number]  HPI Documentation for virtual audio and video telecommunications through [facility] encounter: The patient was located at home. 2 patient identifiers used.  The provider was located in the office. The patient did consent to this visit and is aware of possible charges through their insurance for this visit. The other persons participating in this telemedicine service were none. Time spent on call was 5 minutes and in review of previous records >15 minutes total for counseling and coordination of care. This virtual service is not related to other E/M service within previous 7 days.  She does have underlying diabetes and was placed on Farxiga however she developed vaginal itching and rash and would like to stop that and try different diabetes medication.  Her last hemoglobin A1c was 7.5. Review of Systems     Objective:    Physical Exam  Alert and in no distress otherwise not examined      Assessment & Plan:  Type 2 diabetes mellitus with other specified complication, without long-term current use of insulin (HCC) - Plan: tirzepatide (MOUNJARO) 2.5 MG/0.5ML Pen I discussed options with her and will place her on Mounjaro to also help with weight loss.  Discussed possible side effects of the medication and to call me if there is any question.  She is also to call me in 1 month to let me know how she is doing.  She was comfortable with that.

## 2022-12-30 ENCOUNTER — Telehealth: Payer: Self-pay | Admitting: Internal Medicine

## 2022-12-30 DIAGNOSIS — M8580 Other specified disorders of bone density and structure, unspecified site: Secondary | ICD-10-CM

## 2022-12-30 MED ORDER — DENOSUMAB 60 MG/ML ~~LOC~~ SOSY
60.0000 mg | PREFILLED_SYRINGE | Freq: Once | SUBCUTANEOUS | Status: AC
Start: 2023-02-16 — End: 2023-02-17
  Administered 2023-02-17: 60 mg via SUBCUTANEOUS

## 2022-12-30 NOTE — Telephone Encounter (Signed)
 See prolia referral

## 2023-01-10 ENCOUNTER — Telehealth: Payer: Self-pay | Admitting: Family Medicine

## 2023-01-10 ENCOUNTER — Other Ambulatory Visit: Payer: Self-pay

## 2023-01-10 DIAGNOSIS — E119 Type 2 diabetes mellitus without complications: Secondary | ICD-10-CM

## 2023-01-10 NOTE — Telephone Encounter (Signed)
Pt is wanting a refill on [facility] to [id number] - [place], [place] - [address] [facility] but she says she has only been taking one a day because taking two a day was making her feel weak and dizzy, she says she doesn't know if it is because she started the [facility] but when she started taking one metformin a day she feels better, no weakness or dizziness.

## 2023-01-12 ENCOUNTER — Other Ambulatory Visit: Payer: Self-pay | Admitting: Family Medicine

## 2023-01-12 ENCOUNTER — Other Ambulatory Visit: Payer: Self-pay

## 2023-01-12 DIAGNOSIS — E119 Type 2 diabetes mellitus without complications: Secondary | ICD-10-CM

## 2023-01-12 DIAGNOSIS — E1169 Type 2 diabetes mellitus with other specified complication: Secondary | ICD-10-CM

## 2023-01-12 MED ORDER — METFORMIN HCL 1000 MG PO TABS: 1000.0000 mg | ORAL_TABLET | Freq: Every day | ORAL | 0 refills | Status: DC

## 2023-01-13 NOTE — Telephone Encounter (Signed)
Called both of pts number but did not leave voicemail per [facility]

## 2023-02-01 ENCOUNTER — Telehealth: Payer: Medicare HMO | Admitting: Family Medicine

## 2023-02-03 DIAGNOSIS — M8588 Other specified disorders of bone density and structure, other site: Secondary | ICD-10-CM | POA: Diagnosis not present

## 2023-02-03 DIAGNOSIS — Z1231 Encounter for screening mammogram for malignant neoplasm of breast: Secondary | ICD-10-CM | POA: Diagnosis not present

## 2023-02-03 LAB — HM DEXA SCAN

## 2023-02-03 LAB — HM MAMMOGRAPHY

## 2023-02-07 ENCOUNTER — Encounter: Payer: Self-pay | Admitting: Family Medicine

## 2023-02-07 ENCOUNTER — Encounter: Payer: Self-pay | Admitting: *Deleted

## 2023-02-15 ENCOUNTER — Telehealth: Payer: Self-pay

## 2023-02-15 NOTE — Telephone Encounter (Signed)
Pt ready for scheduling for Prolia on or after : 02/15/23  Out-of-pocket cost due at time of visit: $331.05  Primary: [employer] - Medicare Prolia co-insurance: 20% Admin fee co-insurance: 20%  Secondary: N/A Prolia co-insurance:  Admin fee co-insurance:   Medical Benefit Details: Date Benefits were checked: 12/27/22 Deductible: no/ Coinsurance: 20%/ Admin Fee: 20%  Prior Auth: Approved PA# [id number] Expiration Date: 06/22/20 to 02/28/23 [id number]   Pharmacy benefit: Copay $-- If patient wants fill through the pharmacy benefit please send prescription to:  -- , and include estimated need by date in rx notes. Pharmacy will ship medication directly to the office.  Patient not eligible for Prolia Copay Card. Copay Card can make patient's cost as little as $25. Link to apply: [email or web address]  ** This summary of benefits is an estimation of the patient's out-of-pocket cost. Exact cost may very based on individual plan coverage.

## 2023-02-15 NOTE — Telephone Encounter (Deleted)
Pt ready for scheduling for Prolia on or after : 02/15/23  Out-of-pocket cost due at time of visit: $331.05  Primary: [employer] - Medicare Prolia co-insurance: 20% Admin fee co-insurance: 20%  Secondary: N/A Prolia co-insurance:  Admin fee co-insurance:   Medical Benefit Details: Date Benefits were checked: 12/27/22 Deductible: no/ Coinsurance: 20%/ Admin Fee: 20%  Prior Auth: Approved PA# [id number] Expiration Date: 06/22/20 to 02/28/23 [id number]   Pharmacy benefit: Copay $-- If patient wants fill through the pharmacy benefit please send prescription to:  -- , and include estimated need by date in rx notes. Pharmacy will ship medication directly to the office.  Patient not eligible for Prolia Copay Card. Copay Card can make patient's cost as little as $25. Link to apply: [email or web address]  ** This summary of benefits is an estimation of the patient's out-of-pocket cost. Exact cost may very based on individual plan coverage.

## 2023-02-17 ENCOUNTER — Other Ambulatory Visit (INDEPENDENT_AMBULATORY_CARE_PROVIDER_SITE_OTHER): Payer: Medicare HMO

## 2023-02-17 DIAGNOSIS — M8580 Other specified disorders of bone density and structure, unspecified site: Secondary | ICD-10-CM

## 2023-02-23 ENCOUNTER — Other Ambulatory Visit: Payer: Self-pay | Admitting: Family Medicine

## 2023-02-23 DIAGNOSIS — E039 Hypothyroidism, unspecified: Secondary | ICD-10-CM

## 2023-03-23 ENCOUNTER — Other Ambulatory Visit: Payer: Self-pay | Admitting: Family Medicine

## 2023-03-23 DIAGNOSIS — E1169 Type 2 diabetes mellitus with other specified complication: Secondary | ICD-10-CM

## 2023-03-29 DIAGNOSIS — M1712 Unilateral primary osteoarthritis, left knee: Secondary | ICD-10-CM | POA: Diagnosis not present

## 2023-03-29 DIAGNOSIS — M1711 Unilateral primary osteoarthritis, right knee: Secondary | ICD-10-CM | POA: Diagnosis not present

## 2023-03-29 DIAGNOSIS — M170 Bilateral primary osteoarthritis of knee: Secondary | ICD-10-CM | POA: Diagnosis not present

## 2023-04-04 DIAGNOSIS — M170 Bilateral primary osteoarthritis of knee: Secondary | ICD-10-CM | POA: Diagnosis not present

## 2023-04-11 DIAGNOSIS — M170 Bilateral primary osteoarthritis of knee: Secondary | ICD-10-CM | POA: Diagnosis not present

## 2023-04-25 ENCOUNTER — Encounter: Payer: Self-pay | Admitting: Internal Medicine

## 2023-04-26 ENCOUNTER — Encounter: Payer: Medicare HMO | Admitting: Family Medicine

## 2023-04-28 DIAGNOSIS — Z08 Encounter for follow-up examination after completed treatment for malignant neoplasm: Secondary | ICD-10-CM | POA: Diagnosis not present

## 2023-04-28 DIAGNOSIS — Z85828 Personal history of other malignant neoplasm of skin: Secondary | ICD-10-CM | POA: Diagnosis not present

## 2023-04-28 DIAGNOSIS — L821 Other seborrheic keratosis: Secondary | ICD-10-CM | POA: Diagnosis not present

## 2023-04-28 DIAGNOSIS — L814 Other melanin hyperpigmentation: Secondary | ICD-10-CM | POA: Diagnosis not present

## 2023-04-28 DIAGNOSIS — L570 Actinic keratosis: Secondary | ICD-10-CM | POA: Diagnosis not present

## 2023-05-04 ENCOUNTER — Encounter: Payer: Self-pay | Admitting: Family Medicine

## 2023-05-04 ENCOUNTER — Ambulatory Visit (INDEPENDENT_AMBULATORY_CARE_PROVIDER_SITE_OTHER): Payer: Medicare HMO | Admitting: Family Medicine

## 2023-05-04 VITALS — BP 122/78 | HR 74 | Wt 209.4 lb

## 2023-05-04 DIAGNOSIS — E1121 Type 2 diabetes mellitus with diabetic nephropathy: Secondary | ICD-10-CM

## 2023-05-04 DIAGNOSIS — E1169 Type 2 diabetes mellitus with other specified complication: Secondary | ICD-10-CM

## 2023-05-04 DIAGNOSIS — G4733 Obstructive sleep apnea (adult) (pediatric): Secondary | ICD-10-CM

## 2023-05-04 DIAGNOSIS — R300 Dysuria: Secondary | ICD-10-CM

## 2023-05-04 DIAGNOSIS — Z23 Encounter for immunization: Secondary | ICD-10-CM

## 2023-05-04 DIAGNOSIS — M8580 Other specified disorders of bone density and structure, unspecified site: Secondary | ICD-10-CM | POA: Diagnosis not present

## 2023-05-04 DIAGNOSIS — E785 Hyperlipidemia, unspecified: Secondary | ICD-10-CM

## 2023-05-04 DIAGNOSIS — I152 Hypertension secondary to endocrine disorders: Secondary | ICD-10-CM

## 2023-05-04 DIAGNOSIS — E038 Other specified hypothyroidism: Secondary | ICD-10-CM

## 2023-05-04 DIAGNOSIS — E1159 Type 2 diabetes mellitus with other circulatory complications: Secondary | ICD-10-CM

## 2023-05-04 DIAGNOSIS — E11618 Type 2 diabetes mellitus with other diabetic arthropathy: Secondary | ICD-10-CM

## 2023-05-04 DIAGNOSIS — Z8739 Personal history of other diseases of the musculoskeletal system and connective tissue: Secondary | ICD-10-CM

## 2023-05-04 LAB — POCT GLYCOSYLATED HEMOGLOBIN (HGB A1C): Hemoglobin A1C: 6 % — AB (ref 4.0–5.6)

## 2023-05-04 NOTE — Progress Notes (Signed)
   Subjective:    Patient ID: Carla Wright, female    DOB: November 14, 1945, 78 y.o.   MRN: [id number]  HPI She is here for a med check visit.  She is presently taking Mounjaro and seems to be doing fairly well on that.  She has been on it since November.  She has lost approximately 16 pounds.  She also continues on metformin.  She is taking allopurinol for her underlying gout issue.  Is also taking Synthroid, atorvastatin, lisinopril/HCTZ.  She has starting dating someone and does feel good about this.  They are not currently sexually active.  She does have issues of dealing with some melancholy and dealing with the death of her mother from several years ago as well as recent deaths.   Review of Systems  All other systems reviewed and are negative.      Objective:    Physical Exam Alert and in no distress. Tympanic membranes and canals are normal. Pharyngeal area is normal. Neck is supple without adenopathy or thyromegaly. Cardiac exam shows a regular sinus rhythm without murmurs or gallops. Lungs are clear to auscultation. Hemoglobin A1c is 6.0       Assessment & Plan:  Arthritis associated with diabetes (HCC)  Diabetic nephropathy associated with type 2 diabetes mellitus (HCC)  History of gout  Hyperlipidemia associated with type 2 diabetes mellitus (HCC)  Hypertension associated with diabetes (HCC)  Other specified hypothyroidism  Osteopenia, unspecified location  Sleep apnea, obstructive  Type 2 diabetes mellitus with other specified complication, without long-term current use of insulin (HCC)  I congratulated her on her weight loss and will continue on her present medication regimen.  Recommend she get RSV and COVID at the pharmacy.  Also complemented her on starting to date.  Also think it would be a good idea for her to get involved with counseling to help deal with some unresolved issues especially dealing with the death that was several years ago and still having difficulty  handling that.

## 2023-05-09 ENCOUNTER — Encounter: Payer: Self-pay | Admitting: Internal Medicine

## 2023-05-09 ENCOUNTER — Ambulatory Visit

## 2023-05-09 DIAGNOSIS — R8290 Unspecified abnormal findings in urine: Secondary | ICD-10-CM | POA: Diagnosis not present

## 2023-05-09 LAB — POCT URINALYSIS DIP (PROADVANTAGE DEVICE)
Bilirubin, UA: NEGATIVE
Blood, UA: NEGATIVE
Glucose, UA: NEGATIVE mg/dL
Ketones, POC UA: NEGATIVE mg/dL
Nitrite, UA: NEGATIVE
Specific Gravity, Urine: 1.01
Urobilinogen, Ur: NEGATIVE
pH, UA: 8 (ref 5.0–8.0)

## 2023-05-09 NOTE — Progress Notes (Unsigned)
 Patient dropped off a urine. Nothing in chart of why she is dropping off urine, pt was not in lobby to ask why. Front office staff said we needed to run it before she left.  Dx was abnormal urine

## 2023-05-18 ENCOUNTER — Other Ambulatory Visit: Payer: Self-pay | Admitting: Family Medicine

## 2023-05-18 DIAGNOSIS — E1169 Type 2 diabetes mellitus with other specified complication: Secondary | ICD-10-CM

## 2023-05-24 ENCOUNTER — Encounter: Payer: Self-pay | Admitting: Family Medicine

## 2023-05-24 DIAGNOSIS — E119 Type 2 diabetes mellitus without complications: Secondary | ICD-10-CM | POA: Diagnosis not present

## 2023-05-24 DIAGNOSIS — H04123 Dry eye syndrome of bilateral lacrimal glands: Secondary | ICD-10-CM | POA: Diagnosis not present

## 2023-05-24 DIAGNOSIS — Z961 Presence of intraocular lens: Secondary | ICD-10-CM | POA: Diagnosis not present

## 2023-05-24 LAB — HM DIABETES EYE EXAM

## 2023-06-12 ENCOUNTER — Ambulatory Visit: Admitting: Family Medicine

## 2023-06-12 ENCOUNTER — Encounter: Payer: Self-pay | Admitting: Family Medicine

## 2023-06-12 VITALS — BP 118/70 | HR 84 | Temp 98.3°F | Ht 64.75 in | Wt 210.0 lb

## 2023-06-12 DIAGNOSIS — N3000 Acute cystitis without hematuria: Secondary | ICD-10-CM

## 2023-06-12 DIAGNOSIS — R350 Frequency of micturition: Secondary | ICD-10-CM

## 2023-06-12 DIAGNOSIS — R300 Dysuria: Secondary | ICD-10-CM | POA: Diagnosis not present

## 2023-06-12 LAB — POCT URINALYSIS DIP (PROADVANTAGE DEVICE)
Bilirubin, UA: NEGATIVE
Glucose, UA: NEGATIVE mg/dL
Ketones, POC UA: NEGATIVE mg/dL
Nitrite, UA: NEGATIVE
Protein Ur, POC: 100 mg/dL — AB
Specific Gravity, Urine: 1.01
Urobilinogen, Ur: 0.2
pH, UA: 7.5 (ref 5.0–8.0)

## 2023-06-12 MED ORDER — NITROFURANTOIN MONOHYD MACRO 100 MG PO CAPS: 100.0000 mg | ORAL_CAPSULE | Freq: Two times a day (BID) | ORAL | 0 refills | Status: DC

## 2023-06-12 NOTE — Patient Instructions (Signed)
 Stay well hydrated. Take the antibiotics as directed. Hopefully you will notice improvement in 48 hours. Contact us  if you develop fever, worsening symptoms or pain, blood in the urine, flank pain, vomiting or other concerns. Always wipe front to back and try and avoid contamination to the vaginal area from stool (related to having diarrhea) Consider taking metamucil daily to bulk up stools (this is helpful in treating diarrhea related to metformin). This is fiber, which can also help with constipation.  Return for a pelvic exam if you have ongoing issues with emptying your bladder.

## 2023-06-12 NOTE — Progress Notes (Signed)
 Chief Complaint  Patient presents with   Urinary Frequency    Urinary frequency and burning with urination x 1 month. Always feels like she has to go and then when she gets to the restroom she doesn't.    78 yo patient of Dr. Dwain Giovanni with DM, HTN, HLD, obesity, OSA, h/o gout   She had dropped off a urine to the office last month (no history or any other info in chart).  It showed some protein and 1+ leuks. Last +culture documented in chart was from 2021 (Klebsiella)  She called this past Friday for an appointment due to increased pressure and increased urinary frequency (every 1-1.5 hours). She is waking up 2-3 times/night.She still has lower abdominal pressure. Drinks 1 cup of caffeine/day. Has an occasional tea or Coke, not daily. Mainly drinks water throughout the day. She notes an odor to the urine. Denies hematuria. Pressure in lower stomach, and burning with urination, near the [place].  Hasn't had a pelvic exam in a couple of years.  Had diarrhea in the middle of the night last night. Periodically gets diarrhea (on metformin), not new for her.    PMH, PSH, SH reviewed  Diabetes is well controlled on mounjaro and metformin  Lab Results  Component Value Date   HGBA1C 6.0 (A) 05/04/2023     ROS: no f/c, nausea, vomiting. Occasional diarrhea, per HPI. Urinary symptoms per HPI No headaches, dizziness, chest pain, shortness of breath or URI symptoms.     PHYSICAL EXAM:  BP 118/70   Pulse 84   Temp 98.3 F (36.8 C) (Tympanic)   Ht 5' 4.75" (1.645 m)   Wt 210 lb (95.3 kg)   BMI 35.22 kg/m   Wt Readings from Last 3 Encounters:  06/12/23 210 lb (95.3 kg)  05/04/23 209 lb 6.4 oz (95 kg)  12/27/22 225 lb (102.1 kg)    Well-appearing, pleasant female, in no distress HEENT: conjunctiva and sclera are clear, EOMI Neck: no lymphadenopathy or mass Heart: regular rate and rhythm Lungs: clear bilaterally Abdomen: minimal suprapubic discomfort (more like full  bladder sensation). No mass Back: no CVA tenderness Extremities: no edema Psych: normal mood, affect, hygiene and grooming Neuro: alert and oriented, normal gait, crossly normal cranial nerves   Urine: SG 1.010, cloudy, mod blood, some protein, 3+ leuks    ASSESSMENT/PLAN:  Acute cystitis without hematuria - Plan: nitrofurantoin, macrocrystal-monohydrate, (MACROBID) 100 MG capsule, Urine Culture  Burning with urination - Plan: POCT Urinalysis DIP (Proadvantage Device)  Urinary frequency - Plan: POCT Urinalysis DIP (Proadvantage Device)  Ddx reviewed with patient. If symptoms don't resolve with treatment for UTI, needs to return and have pelvic exam (and recheck of urine).  Reviewed proper hygiene. Discussed trial of metamucil (due to diarrhea intermittently from metformin).

## 2023-06-14 LAB — URINE CULTURE

## 2023-06-28 ENCOUNTER — Ambulatory Visit: Admitting: Family Medicine

## 2023-06-30 ENCOUNTER — Other Ambulatory Visit: Payer: Self-pay | Admitting: Internal Medicine

## 2023-07-04 ENCOUNTER — Other Ambulatory Visit: Payer: Self-pay | Admitting: Family Medicine

## 2023-07-04 DIAGNOSIS — E119 Type 2 diabetes mellitus without complications: Secondary | ICD-10-CM

## 2023-07-11 ENCOUNTER — Telehealth: Payer: Self-pay | Admitting: Pharmacy Technician

## 2023-07-11 NOTE — Telephone Encounter (Signed)
 Auth Submission: NO AUTH NEEDED Site of care: Site of care: [facility] Payer: MEDICARE A/B & SUPP Medication & CPT/J Code(s) submitted: Prolia  (Denosumab ) [id number] Route of submission (phone, fax, portal):  Phone # Fax # Auth type: Buy/Bill PB Units/visits requested: 60MG  Q6 MONTHS X 2 DOSES Reference number:  Approval from: 07/11/23 to 02/28/24

## 2023-07-22 ENCOUNTER — Encounter: Payer: Self-pay | Admitting: Family Medicine

## 2023-07-26 ENCOUNTER — Telehealth: Payer: Self-pay

## 2023-07-26 NOTE — Telephone Encounter (Signed)
 Auth Submission: APPROVED Site of care: Site of care: [facility] Payer: [employer] medicare Medication & CPT/J Code(s) submitted: Prolia  (Denosumab ) [id number] Route of submission (phone, fax, portal): portal Phone # Fax # Auth type: Buy/Bill PB Units/visits requested: 60mg  x 2 doses Reference number: [id number] Approval from: 06/22/20 to 02/28/24

## 2023-08-14 ENCOUNTER — Other Ambulatory Visit: Payer: Self-pay | Admitting: Family Medicine

## 2023-08-14 DIAGNOSIS — E1159 Type 2 diabetes mellitus with other circulatory complications: Secondary | ICD-10-CM

## 2023-08-14 DIAGNOSIS — E1169 Type 2 diabetes mellitus with other specified complication: Secondary | ICD-10-CM

## 2023-08-21 ENCOUNTER — Ambulatory Visit (INDEPENDENT_AMBULATORY_CARE_PROVIDER_SITE_OTHER): Payer: PRIVATE HEALTH INSURANCE

## 2023-08-21 VITALS — BP 103/70 | HR 104 | Temp 98.7°F | Resp 24 | Ht 64.75 in | Wt 213.2 lb

## 2023-08-21 DIAGNOSIS — M8580 Other specified disorders of bone density and structure, unspecified site: Secondary | ICD-10-CM

## 2023-08-21 MED ORDER — DENOSUMAB 60 MG/ML ~~LOC~~ SOSY
60.0000 mg | PREFILLED_SYRINGE | Freq: Once | SUBCUTANEOUS | Status: AC
  Administered 2023-08-21: 60 mg via SUBCUTANEOUS
  Filled 2023-08-21: qty 1

## 2023-08-21 NOTE — Progress Notes (Signed)
 Diagnosis: Osteopenia  Provider:  Mannam, Praveen MD  Procedure: Injection  Prolia  (Denosumab ), Dose: 60 mg, Site: subcutaneous, Number of injections: 1  Injection Site(s): Left arm  Post Care: Patient declined observation  Discharge: Condition: Good, Destination: Home . AVS Provided  Performed by:  Rocky FORBES Sar, RN

## 2023-10-27 DIAGNOSIS — L570 Actinic keratosis: Secondary | ICD-10-CM | POA: Diagnosis not present

## 2023-10-27 DIAGNOSIS — L821 Other seborrheic keratosis: Secondary | ICD-10-CM | POA: Diagnosis not present

## 2023-10-27 DIAGNOSIS — Z08 Encounter for follow-up examination after completed treatment for malignant neoplasm: Secondary | ICD-10-CM | POA: Diagnosis not present

## 2023-10-27 DIAGNOSIS — D225 Melanocytic nevi of trunk: Secondary | ICD-10-CM | POA: Diagnosis not present

## 2023-10-27 DIAGNOSIS — L304 Erythema intertrigo: Secondary | ICD-10-CM | POA: Diagnosis not present

## 2023-10-27 DIAGNOSIS — L578 Other skin changes due to chronic exposure to nonionizing radiation: Secondary | ICD-10-CM | POA: Diagnosis not present

## 2023-10-27 DIAGNOSIS — Z85828 Personal history of other malignant neoplasm of skin: Secondary | ICD-10-CM | POA: Diagnosis not present

## 2023-10-27 DIAGNOSIS — L814 Other melanin hyperpigmentation: Secondary | ICD-10-CM | POA: Diagnosis not present

## 2023-11-03 ENCOUNTER — Other Ambulatory Visit: Payer: Self-pay | Admitting: Family Medicine

## 2023-11-03 DIAGNOSIS — E1169 Type 2 diabetes mellitus with other specified complication: Secondary | ICD-10-CM

## 2023-11-07 ENCOUNTER — Encounter: Admitting: Family Medicine

## 2023-11-11 ENCOUNTER — Other Ambulatory Visit: Payer: Self-pay | Admitting: Family Medicine

## 2023-11-11 DIAGNOSIS — E039 Hypothyroidism, unspecified: Secondary | ICD-10-CM

## 2023-11-19 ENCOUNTER — Other Ambulatory Visit: Payer: Self-pay | Admitting: Family Medicine

## 2023-11-19 DIAGNOSIS — Z8739 Personal history of other diseases of the musculoskeletal system and connective tissue: Secondary | ICD-10-CM

## 2023-11-19 DIAGNOSIS — E119 Type 2 diabetes mellitus without complications: Secondary | ICD-10-CM

## 2023-11-21 ENCOUNTER — Ambulatory Visit: Payer: Medicare HMO | Admitting: Family Medicine

## 2023-12-04 ENCOUNTER — Encounter: Payer: Self-pay | Admitting: Family Medicine

## 2023-12-04 ENCOUNTER — Ambulatory Visit: Admitting: Family Medicine

## 2023-12-04 VITALS — BP 124/78 | HR 98 | Ht 64.0 in | Wt 200.8 lb

## 2023-12-04 DIAGNOSIS — Z0000 Encounter for general adult medical examination without abnormal findings: Secondary | ICD-10-CM | POA: Diagnosis not present

## 2023-12-04 DIAGNOSIS — E119 Type 2 diabetes mellitus without complications: Secondary | ICD-10-CM | POA: Diagnosis not present

## 2023-12-04 DIAGNOSIS — E039 Hypothyroidism, unspecified: Secondary | ICD-10-CM | POA: Diagnosis not present

## 2023-12-04 DIAGNOSIS — Z8739 Personal history of other diseases of the musculoskeletal system and connective tissue: Secondary | ICD-10-CM | POA: Diagnosis not present

## 2023-12-04 DIAGNOSIS — Z23 Encounter for immunization: Secondary | ICD-10-CM | POA: Diagnosis not present

## 2023-12-04 DIAGNOSIS — E1169 Type 2 diabetes mellitus with other specified complication: Secondary | ICD-10-CM | POA: Diagnosis not present

## 2023-12-04 LAB — POCT GLYCOSYLATED HEMOGLOBIN (HGB A1C): Hemoglobin A1C: 6.5 % — AB (ref 4.0–5.6)

## 2023-12-04 MED ORDER — TIRZEPATIDE 5 MG/0.5ML ~~LOC~~ SOAJ: 5.0000 mg | SUBCUTANEOUS | 0 refills | Status: DC

## 2023-12-04 MED ORDER — ALLOPURINOL 300 MG PO TABS: 300.0000 mg | ORAL_TABLET | Freq: Every day | ORAL | 0 refills | Status: DC

## 2023-12-04 MED ORDER — METFORMIN HCL 1000 MG PO TABS: 2000.0000 mg | ORAL_TABLET | Freq: Every day | ORAL | 1 refills | Status: AC

## 2023-12-04 NOTE — Progress Notes (Signed)
 Name: Carla Wright   Date of Visit: 12/04/23   Date of last visit with me: Visit date not found   CHIEF COMPLAINT:  Chief Complaint  Patient presents with   Annual Exam    Cpe and awv. 6 month follow up.        HPI:  Discussed the use of AI scribe software for clinical note transcription with the patient, who gave verbal consent to proceed.  History of Present Illness   Carla Wright is a 78 year old female with diabetes who presents for routine follow-up and lab work.  She is here for routine follow-up and lab work, including A1c testing. She wants to improve her eating habits and is concerned about her A1c levels.  She mentions having 'shy kidneys' and a family history of similar issues, as her sister experiences the same problem. She was unable to provide a urine sample this morning despite adequate hydration.  Her diabetes has been well controlled in the past. She is currently on Mounjaro , which she feels is effective, although it allows her to overeat. She reduced her metformin  from two tablets to one after noticing she had plenty of medication.  She is on a low dose of cholesterol medication, which she tolerates well, although she attributes joint pain to it. Her cholesterol levels have been stable historically. She continues to take allopurinol , which she believes helps prevent major gout attacks.  She lives independently . Her living environment is safe, with handrails and grab bars installed, and no tripping hazards like throw rugs or cords. She reports a perfect score on her memory test.         OBJECTIVE:       12/04/2023   10:11 AM  Depression screen PHQ 2/9  Decreased Interest 0  Down, Depressed, Hopeless 0  PHQ - 2 Score 0     BP Readings from Last 3 Encounters:  12/04/23 124/78  08/21/23 103/70  06/12/23 118/70    BP 124/78   Pulse 98   Ht 5' 4 (1.626 m)   Wt 200 lb 12.8 oz (91.1 kg)   SpO2 98%   BMI 34.47 kg/m    Physical Exam           Physical Exam Constitutional:      Appearance: Normal appearance.  Neurological:     General: No focal deficit present.     Mental Status: She is alert and oriented to person, place, and time. Mental status is at baseline.     ASSESSMENT/PLAN:   Assessment & Plan Annual wellness visit  Type 2 diabetes mellitus with other specified complication, without long-term current use of insulin (HCC)  Type 2 diabetes mellitus without complication, without long-term current use of insulin (HCC)  History of gout  Hypothyroidism, unspecified type  Annual physical exam    Assessment and Plan    Adult Wellness Visit Routine wellness visit for a 78 year old female. Discussed general health and wellness, including home safety and cognitive function. - Administer influenza and COVID-19 vaccines. -   I reviewed and addressed the Medicare-specific components of today's visit, including preventive services eligibility, screening recommendations, and health maintenance items as applicable.   Annual Physical -- Full history and exam completed today in addition to [facility] Wellness Visit. Reviewed interval concerns, chronic conditions, and preventive care needs. Physical exam performed. Counseling provided on lifestyle, screenings, vaccines, and routine health maintenance.   Type 2 diabetes mellitus Type 2 diabetes mellitus with recent A1c of  6.5%. Previously well-controlled with metformin  and Mounjaro . Discussed increasing Mounjaro  dosage to improve glycemic control. Emphasized maintaining two metformin  tablets per day. Decision to increase Mounjaro  to 5 mg and re-evaluate in one month. - Increase Mounjaro  to 5 mg. - Continue metformin  at 2000 mg daily. - Recheck A1c in two months. - Follow up in one month to assess response to Mounjaro  dosage increase.  Hyperlipidemia Hyperlipidemia managed with low dose cholesterol medication. Joint pain attributed to medication. Discussed potential dosage  increase if diabetes remains uncontrolled. - Continue current cholesterol medication dosage. - Reassess cholesterol management in three months if diabetes control does not improve.  Hypertension - Continue current blood pressure medication.  Gout Gout is well-controlled with allopurinol . - Continue allopurinol .  Hypothyroidism - Continue Synthroid .         Jacquelyn Antony A. Vita MD [facility]

## 2023-12-04 NOTE — Patient Instructions (Signed)
 Increase your

## 2023-12-05 ENCOUNTER — Ambulatory Visit: Payer: Self-pay | Admitting: Family Medicine

## 2023-12-05 LAB — COMPREHENSIVE METABOLIC PANEL WITH GFR
ALT: 11 IU/L (ref 0–32)
AST: 15 IU/L (ref 0–40)
Albumin: 4 g/dL (ref 3.8–4.8)
Alkaline Phosphatase: 84 IU/L (ref 49–135)
BUN/Creatinine Ratio: 21 (ref 12–28)
BUN: 31 mg/dL — ABNORMAL HIGH (ref 8–27)
Bilirubin Total: 0.4 mg/dL (ref 0.0–1.2)
CO2: 17 mmol/L — ABNORMAL LOW (ref 20–29)
Calcium: 9.8 mg/dL (ref 8.7–10.3)
Chloride: 106 mmol/L (ref 96–106)
Creatinine, Ser: 1.46 mg/dL — ABNORMAL HIGH (ref 0.57–1.00)
Globulin, Total: 2.7 g/dL (ref 1.5–4.5)
Glucose: 107 mg/dL — ABNORMAL HIGH (ref 70–99)
Potassium: 4.2 mmol/L (ref 3.5–5.2)
Sodium: 140 mmol/L (ref 134–144)
Total Protein: 6.7 g/dL (ref 6.0–8.5)
eGFR: 37 mL/min/1.73 — ABNORMAL LOW (ref >59)

## 2023-12-05 LAB — CBC WITH DIFFERENTIAL/PLATELET
Basophils Absolute: 0 x10E3/uL (ref 0.0–0.2)
Basos: 0 %
EOS (ABSOLUTE): 0.2 x10E3/uL (ref 0.0–0.4)
Eos: 2 %
Hematocrit: 36.7 % (ref 34.0–46.6)
Hemoglobin: 11.6 g/dL (ref 11.1–15.9)
Immature Grans (Abs): 0 x10E3/uL (ref 0.0–0.1)
Immature Granulocytes: 0 %
Lymphocytes Absolute: 2 x10E3/uL (ref 0.7–3.1)
Lymphs: 20 %
MCH: 30.4 pg (ref 26.6–33.0)
MCHC: 31.6 g/dL (ref 31.5–35.7)
MCV: 96 fL (ref 79–97)
Monocytes Absolute: 0.6 x10E3/uL (ref 0.1–0.9)
Monocytes: 6 %
Neutrophils Absolute: 6.9 x10E3/uL (ref 1.4–7.0)
Neutrophils: 72 %
Platelets: 208 x10E3/uL (ref 150–450)
RBC: 3.81 x10E6/uL (ref 3.77–5.28)
RDW: 13.5 % (ref 11.7–15.4)
WBC: 9.7 x10E3/uL (ref 3.4–10.8)

## 2023-12-05 LAB — LIPID PANEL
Chol/HDL Ratio: 2.3 ratio (ref 0.0–4.4)
Cholesterol, Total: 125 mg/dL (ref 100–199)
HDL: 54 mg/dL (ref >39)
LDL Chol Calc (NIH): 57 mg/dL (ref 0–99)
Triglycerides: 68 mg/dL (ref 0–149)
VLDL Cholesterol Cal: 14 mg/dL (ref 5–40)

## 2023-12-05 LAB — TSH: TSH: 2.13 u[IU]/mL (ref 0.450–4.500)

## 2024-01-04 ENCOUNTER — Ambulatory Visit (INDEPENDENT_AMBULATORY_CARE_PROVIDER_SITE_OTHER): Payer: Self-pay | Admitting: Family Medicine

## 2024-01-04 ENCOUNTER — Encounter: Payer: Self-pay | Admitting: Family Medicine

## 2024-01-04 VITALS — BP 120/80 | HR 101 | Wt 220.2 lb

## 2024-01-04 DIAGNOSIS — M25561 Pain in right knee: Secondary | ICD-10-CM | POA: Diagnosis not present

## 2024-01-04 DIAGNOSIS — M25562 Pain in left knee: Secondary | ICD-10-CM | POA: Diagnosis not present

## 2024-01-04 DIAGNOSIS — G8929 Other chronic pain: Secondary | ICD-10-CM

## 2024-01-04 DIAGNOSIS — Z7985 Long-term (current) use of injectable non-insulin antidiabetic drugs: Secondary | ICD-10-CM

## 2024-01-04 DIAGNOSIS — E119 Type 2 diabetes mellitus without complications: Secondary | ICD-10-CM | POA: Diagnosis not present

## 2024-01-04 MED ORDER — TIRZEPATIDE 7.5 MG/0.5ML ~~LOC~~ SOAJ: 7.5000 mg | SUBCUTANEOUS | 2 refills | Status: DC

## 2024-01-04 NOTE — Progress Notes (Signed)
   Name: JONAYA FRESHOUR   Date of Visit: 01/04/24   Date of last visit with me: 12/04/2023   CHIEF COMPLAINT:  Chief Complaint  Patient presents with   Follow-up    1 month follow up on mounjaro .        HPI:  Discussed the use of AI scribe software for clinical note transcription with the patient, who gave verbal consent to proceed.  History of Present Illness Carla Wright is a 78 year old female who presents for medication management and follow-up on weight management.  She is currently taking Mounjaro  5 mg, which she has been on for four weeks after previously being on 2.5 mg. She experienced a transient 'yucky feeling' in her stomach that resolved yesterday. No other side effects from her medication are noted, and she feels less hungry.  She reports a weight gain from 209 pounds to 220 pounds and expresses frustration with this change. Despite the weight gain, she feels less hungry but admits to not fully adhering to her dietary plan.  She has a history of knee pain and describes having 'bad knees.' She receives injections for her knees every couple of years and tries to delay them as long as possible. She does not currently use compression sleeves on her knees.  Her kidney function was previously noted to have increased slightly.  No side effects from her medication other than the transient stomach discomfort. She confirms feeling less hungry.     OBJECTIVE:       12/04/2023   10:11 AM  Depression screen PHQ 2/9  Decreased Interest 0  Down, Depressed, Hopeless 0  PHQ - 2 Score 0     BP Readings from Last 3 Encounters:  01/04/24 120/80  12/04/23 124/78  08/21/23 103/70    BP 120/80   Pulse (!) 101   Wt 220 lb 3.2 oz (99.9 kg)   SpO2 99%   BMI 37.80 kg/m    Physical Exam    Physical Exam Constitutional:      Appearance: Normal appearance.  Neurological:     General: No focal deficit present.     Mental Status: She is alert and oriented to person, place,  and time. Mental status is at baseline.     ASSESSMENT/PLAN:   Assessment & Plan Type 2 diabetes mellitus without complication, without long-term current use of insulin (HCC)  Chronic pain of both knees    Assessment and Plan Assessment & Plan Type 2 diabetes mellitus Managed with Mounjaro  and Metformin . Reports decreased appetite and minor weight loss. No significant side effects from Mounjaro . - Increased Mounjaro  to 7.5 mg. - Encouraged increased physical activity, such as walking. - Advised on dietary modifications, including not eating when not hungry. - Will repeat A1c in January.  Knee osteoarthritis with pain and swelling Chronic condition with pain and swelling, worsened by weather. Prefers to delay further injections. - Recommended wearing compression sleeves during walks. - Advised on non-invasive management strategies to reduce swelling. - Offered orthopedic consultation if symptoms worsen.     Chelsia Serres A. Vita MD [facility]

## 2024-01-24 DIAGNOSIS — L578 Other skin changes due to chronic exposure to nonionizing radiation: Secondary | ICD-10-CM | POA: Diagnosis not present

## 2024-01-24 DIAGNOSIS — Z09 Encounter for follow-up examination after completed treatment for conditions other than malignant neoplasm: Secondary | ICD-10-CM | POA: Diagnosis not present

## 2024-02-06 ENCOUNTER — Ambulatory Visit: Payer: Self-pay | Admitting: Family Medicine

## 2024-02-15 NOTE — Progress Notes (Signed)
 Carla Wright                                          MRN: [id number]   02/15/2024   The [facility] Quality Team Specialist reviewed this patient medical record for the purposes of chart review for care gap closure. The following were reviewed: chart review for care gap closure-kidney health evaluation for diabetes:uACR.    [facility] Quality Team

## 2024-02-23 ENCOUNTER — Ambulatory Visit (INDEPENDENT_AMBULATORY_CARE_PROVIDER_SITE_OTHER): Payer: PRIVATE HEALTH INSURANCE

## 2024-02-23 VITALS — BP 128/79 | HR 104 | Temp 97.7°F | Resp 20 | Ht 64.75 in | Wt 209.2 lb

## 2024-02-23 DIAGNOSIS — M8580 Other specified disorders of bone density and structure, unspecified site: Secondary | ICD-10-CM | POA: Diagnosis not present

## 2024-02-23 MED ORDER — DENOSUMAB 60 MG/ML ~~LOC~~ SOSY
60.0000 mg | PREFILLED_SYRINGE | Freq: Once | SUBCUTANEOUS | Status: AC
  Administered 2024-02-23: 60 mg via SUBCUTANEOUS
  Filled 2024-02-23: qty 1

## 2024-02-23 NOTE — Progress Notes (Signed)
 Diagnosis: Osteoporosis  Provider:  Mannam, Praveen MD  Procedure: Injection  Prolia  (Denosumab ), Dose: 60 mg, Site: subcutaneous, Number of injections: 1  Injection Site(s): Left lower quad. abdomen  Post Care: Patient declined observation  Discharge: Condition: Good, Destination: Home . AVS Declined  Performed by:  Lennard Capek, RN

## 2024-02-25 ENCOUNTER — Other Ambulatory Visit: Payer: Self-pay | Admitting: Family Medicine

## 2024-02-25 DIAGNOSIS — Z8739 Personal history of other diseases of the musculoskeletal system and connective tissue: Secondary | ICD-10-CM

## 2024-02-27 ENCOUNTER — Encounter: Payer: Self-pay | Admitting: Family Medicine

## 2024-03-07 LAB — HM MAMMOGRAPHY

## 2024-03-11 ENCOUNTER — Encounter: Payer: Self-pay | Admitting: Family Medicine

## 2024-03-11 ENCOUNTER — Ambulatory Visit: Payer: PRIVATE HEALTH INSURANCE | Admitting: Family Medicine

## 2024-03-11 VITALS — BP 128/82 | HR 117 | Wt 208.0 lb

## 2024-03-11 DIAGNOSIS — E119 Type 2 diabetes mellitus without complications: Secondary | ICD-10-CM | POA: Diagnosis not present

## 2024-03-11 DIAGNOSIS — Z7984 Long term (current) use of oral hypoglycemic drugs: Secondary | ICD-10-CM

## 2024-03-11 MED ORDER — TIRZEPATIDE 7.5 MG/0.5ML ~~LOC~~ SOAJ: 7.5000 mg | SUBCUTANEOUS | 2 refills | Status: AC

## 2024-03-11 NOTE — Progress Notes (Signed)
" ° °  Name: Carla Wright   Date of Visit: 03/11/2024   Date of last visit with me: 02/25/2024   CHIEF COMPLAINT:  Chief Complaint  Patient presents with   other    2 month f/u, on A1C,        HPI:  Discussed the use of AI scribe software for clinical note transcription with the patient, who gave verbal consent to proceed.  History of Present Illness   Carla Wright is a 79 year old female with obesity and knee osteoarthritis who presents for follow-up on weight management and knee pain.  She is currently taking Mijara 7.5 mg, which has facilitated successful weight loss over the past one and a half to two months. The medication helps her feel fuller longer, but she experiences discomfort if she overeats. She is actively trying to avoid the habit of finishing her plate, which she attributes to her upbringing.  She is also on metformin . She was initially fasting for an A1c test but was informed that fasting is not necessary for this test as it measures a three-month average.  Her knee pain is described as 'bone on bone' with spurs, particularly on the right side. The pain is localized to the side of the knee, especially when driving. She questions if this could be bursitis. She last received an injection for her knee pain two to three years ago. Currently, the pain is mild, rated at less than 4 out of 10, and does not significantly bother her unless she is on her feet for extended periods. She uses a cane for balance and finds that her knees do not hurt much when walking.         OBJECTIVE:       12/04/2023   10:11 AM  Depression screen PHQ 2/9  Decreased Interest 0  Down, Depressed, Hopeless 0  PHQ - 2 Score 0     BP Readings from Last 3 Encounters:  03/11/24 128/82  02/23/24 128/79  01/04/24 120/80    BP 128/82   Pulse (!) 117   Wt 208 lb (94.3 kg)   BMI 34.88 kg/m    Physical Exam          Physical Exam Constitutional:      Appearance: Normal appearance.   Neurological:     General: No focal deficit present.     Mental Status: She is alert and oriented to person, place, and time. Mental status is at baseline.     ASSESSMENT/PLAN:   Assessment & Plan Type 2 diabetes mellitus without complication, without long-term current use of insulin (HCC)    Assessment and Plan    Type 2 diabetes mellitus Managed with metformin  and Mijara 7.5 mg, effective for weight loss and appetite control. Deferred A1c testing due to potential insurance issues if results are non-diabetic. - Continue mounjaro  7.5 mg. Medication refilled, patient having appropriate weight loss.  - Continue metformin . - Repeat A1c testing in six months.  Knee osteoarthritis Chronic knee osteoarthritis with bone-on-bone changes and spurs, pain less than 4/10, exacerbated by prolonged activity. - Use ice for pain management, avoid heat. - Consider anti-inflammatory medication as needed. - Consider knee injection if pain increases or for upcoming activities.  General Health Maintenance Routine health maintenance discussed. - Scheduled physical and Medicare evaluation for October 6th.         Chanese Hartsough A. Vita MD [facility] "

## 2024-08-26 ENCOUNTER — Ambulatory Visit: Payer: PRIVATE HEALTH INSURANCE

## 2025-01-06 ENCOUNTER — Encounter: Payer: PRIVATE HEALTH INSURANCE | Admitting: Family Medicine
